# Patient Record
Sex: Female | Born: 1945 | Race: Black or African American | Hispanic: No | Smoking: Never smoker
Health system: Southern US, Community
[De-identification: ages and names within clinical notes are randomized; demographics above are authoritative.]

## PROBLEM LIST (undated history)

## (undated) DIAGNOSIS — I82409 Acute embolism and thrombosis of unspecified deep veins of unspecified lower extremity: Secondary | ICD-10-CM

## (undated) DIAGNOSIS — K759 Inflammatory liver disease, unspecified: Secondary | ICD-10-CM

## (undated) DIAGNOSIS — E119 Type 2 diabetes mellitus without complications: Secondary | ICD-10-CM

## (undated) DIAGNOSIS — J189 Pneumonia, unspecified organism: Secondary | ICD-10-CM

## (undated) DIAGNOSIS — M1990 Unspecified osteoarthritis, unspecified site: Secondary | ICD-10-CM

## (undated) DIAGNOSIS — N189 Chronic kidney disease, unspecified: Secondary | ICD-10-CM

## (undated) DIAGNOSIS — R7303 Prediabetes: Secondary | ICD-10-CM

## (undated) DIAGNOSIS — I10 Essential (primary) hypertension: Secondary | ICD-10-CM

## (undated) HISTORY — PX: EYE SURGERY: SHX253

---

## 2003-09-22 ENCOUNTER — Ambulatory Visit: Payer: Self-pay | Admitting: Family Medicine

## 2003-09-23 ENCOUNTER — Ambulatory Visit: Payer: Self-pay | Admitting: *Deleted

## 2003-09-27 ENCOUNTER — Ambulatory Visit (HOSPITAL_COMMUNITY): Admission: RE | Admit: 2003-09-27 | Discharge: 2003-09-27 | Payer: Self-pay | Admitting: Internal Medicine

## 2003-09-28 ENCOUNTER — Ambulatory Visit (HOSPITAL_COMMUNITY): Admission: RE | Admit: 2003-09-28 | Discharge: 2003-09-28 | Payer: Self-pay | Admitting: Internal Medicine

## 2004-03-03 ENCOUNTER — Ambulatory Visit: Payer: Self-pay | Admitting: Family Medicine

## 2004-04-24 ENCOUNTER — Ambulatory Visit: Payer: Self-pay | Admitting: Family Medicine

## 2004-04-24 ENCOUNTER — Ambulatory Visit: Admission: RE | Admit: 2004-04-24 | Discharge: 2004-04-24 | Payer: Self-pay | Admitting: Internal Medicine

## 2004-06-23 ENCOUNTER — Ambulatory Visit: Payer: Self-pay | Admitting: Family Medicine

## 2004-06-29 ENCOUNTER — Ambulatory Visit: Payer: Self-pay | Admitting: Family Medicine

## 2004-07-28 ENCOUNTER — Ambulatory Visit (HOSPITAL_COMMUNITY): Admission: RE | Admit: 2004-07-28 | Discharge: 2004-07-28 | Payer: Self-pay | Admitting: Internal Medicine

## 2004-09-12 ENCOUNTER — Ambulatory Visit: Payer: Self-pay | Admitting: Family Medicine

## 2005-02-08 ENCOUNTER — Ambulatory Visit: Payer: Self-pay | Admitting: Family Medicine

## 2005-03-12 ENCOUNTER — Ambulatory Visit: Payer: Self-pay | Admitting: Family Medicine

## 2005-05-18 ENCOUNTER — Ambulatory Visit: Payer: Self-pay | Admitting: Family Medicine

## 2005-05-18 LAB — CONVERTED CEMR LAB: Pap Smear: NORMAL

## 2005-07-27 ENCOUNTER — Ambulatory Visit: Payer: Self-pay | Admitting: Family Medicine

## 2005-12-21 ENCOUNTER — Ambulatory Visit (HOSPITAL_COMMUNITY): Admission: RE | Admit: 2005-12-21 | Discharge: 2005-12-21 | Payer: Self-pay | Admitting: Family Medicine

## 2006-01-11 ENCOUNTER — Ambulatory Visit: Payer: Self-pay | Admitting: Family Medicine

## 2006-02-18 ENCOUNTER — Ambulatory Visit: Payer: Self-pay | Admitting: Family Medicine

## 2006-05-01 ENCOUNTER — Ambulatory Visit: Payer: Self-pay | Admitting: Family Medicine

## 2006-07-02 ENCOUNTER — Ambulatory Visit: Payer: Self-pay | Admitting: Internal Medicine

## 2006-07-09 ENCOUNTER — Encounter (INDEPENDENT_AMBULATORY_CARE_PROVIDER_SITE_OTHER): Payer: Self-pay | Admitting: Family Medicine

## 2006-07-09 DIAGNOSIS — E785 Hyperlipidemia, unspecified: Secondary | ICD-10-CM | POA: Insufficient documentation

## 2006-07-09 DIAGNOSIS — D509 Iron deficiency anemia, unspecified: Secondary | ICD-10-CM | POA: Insufficient documentation

## 2006-07-09 DIAGNOSIS — F329 Major depressive disorder, single episode, unspecified: Secondary | ICD-10-CM | POA: Insufficient documentation

## 2006-07-09 DIAGNOSIS — I10 Essential (primary) hypertension: Secondary | ICD-10-CM | POA: Insufficient documentation

## 2006-09-25 ENCOUNTER — Encounter (INDEPENDENT_AMBULATORY_CARE_PROVIDER_SITE_OTHER): Payer: Self-pay | Admitting: *Deleted

## 2007-02-05 ENCOUNTER — Ambulatory Visit (HOSPITAL_COMMUNITY): Admission: RE | Admit: 2007-02-05 | Discharge: 2007-02-05 | Payer: Self-pay | Admitting: Family Medicine

## 2007-03-12 ENCOUNTER — Ambulatory Visit: Payer: Self-pay | Admitting: Internal Medicine

## 2007-03-12 ENCOUNTER — Encounter (INDEPENDENT_AMBULATORY_CARE_PROVIDER_SITE_OTHER): Payer: Self-pay | Admitting: Family Medicine

## 2007-03-12 LAB — CONVERTED CEMR LAB
AST: 23 units/L (ref 0–37)
Albumin: 2.8 g/dL — ABNORMAL LOW (ref 3.5–5.2)
Basophils Absolute: 0 10*3/uL (ref 0.0–0.1)
CO2: 29 meq/L (ref 19–32)
Chloride: 103 meq/L (ref 96–112)
Cholesterol: 274 mg/dL — ABNORMAL HIGH (ref 0–200)
Creatinine, Ser: 0.8 mg/dL (ref 0.40–1.20)
Glucose, Bld: 82 mg/dL (ref 70–99)
HCT: 36.3 % (ref 36.0–46.0)
HDL: 94 mg/dL (ref >39)
Hemoglobin: 11 g/dL — ABNORMAL LOW (ref 12.0–15.0)
Lymphs Abs: 1.8 10*3/uL (ref 0.7–4.0)
MCHC: 30.3 g/dL (ref 30.0–36.0)
MCV: 69.9 fL — ABNORMAL LOW (ref 78.0–100.0)
Monocytes Relative: 10 % (ref 3–12)
Potassium: 3.8 meq/L (ref 3.5–5.3)
RBC: 5.19 M/uL — ABNORMAL HIGH (ref 3.87–5.11)
RDW: 16 % — ABNORMAL HIGH (ref 11.5–15.5)
Sodium: 141 meq/L (ref 135–145)
Total Bilirubin: 0.3 mg/dL (ref 0.3–1.2)

## 2007-03-15 ENCOUNTER — Encounter (INDEPENDENT_AMBULATORY_CARE_PROVIDER_SITE_OTHER): Payer: Self-pay | Admitting: Family Medicine

## 2007-03-15 LAB — CONVERTED CEMR LAB
Collection Interval-CRCL: 24 hr
Creatinine 24 HR UR: 804 mg/24hr (ref 700–1800)
Creatinine Clearance: 70 mL/min — ABNORMAL LOW (ref 75–115)

## 2007-03-21 ENCOUNTER — Ambulatory Visit: Payer: Self-pay | Admitting: Internal Medicine

## 2010-01-29 ENCOUNTER — Encounter: Payer: Self-pay | Admitting: Internal Medicine

## 2010-02-08 NOTE — Miscellaneous (Signed)
Summary: VIP  Patient: Tina Bryant Note: All result statuses are Final unless otherwise noted.  Tests: (1) VIP (Medications)   LLIMPORTMEDS              "Result Below..."       RESULT: VISTARIL CAPS 25 MG*TAKE ONE CAPSULE BY MOUTH AT BEDTIME AS NEEDED FOR  SLEEP*01/11/2006*Last Refill: [id number]*******   LLIMPORTMEDS              "Result Below..."       RESULT: TRAMADOL HCL TABS 50 MG*TAKE ONE TABLET EVERY 8 HOURS AS NEEDED FOR  BACK PAIN  Generic for ULTRAM 50MG  TAB*07/02/2006*Last Refill: [id number]*******   LLIMPORTMEDS              "Result Below..."       RESULT: TOPROL XL TB24 50 MG*TAKE ONE (1) TABLET EACH DAY*08/15/2006*Last Refill: 10/02/2006*[id number]*******   LLIMPORTMEDS              "Result Below..."       RESULT: METHOCARBAMOL TABS 500 MG*TAKE ONE TABLET AT BEDTIME AS NEEDED FOR  MUSCLE RELAXATION  Generic for ROBAXIN 500MG  TAB*07/02/2006*Last Refill: [id number]*******   LLIMPORTMEDS              "Result Below..."       RESULT: LIPITOR TABS 10 MG*TAKE ONE (1) TABLET EACH DAY*08/15/2006*Last Refill: 10/02/2006*[id number]*******   LLIMPORTMEDS              "Result Below..."       RESULT: HYDROCHLOROTHIAZIDE TABS 25 MG*TAKE ONE (1) TABLET EVERY MORNING*08/15/2006*Last Refill: 10/02/2006*[id number]*******   LLIMPORTMEDS              "Result Below..."       RESULT: COMPOUND CREAM*APPLY TO SKIN TWICE DAILY AS NEEDED  TRIAMCINOLONE  CREAM 0.1%/PETROLATUM 1:2*01/11/2006*Last Refill: Unknown********   LLIMPORTALLS              NKDA***  Note: An exclamation mark (!) indicates a result that was not dispersed into the flowsheet. Document Creation Date: 11/07/2006 3:00 PM _______________________________________________________________________  (1) Order result status: Final Collection or observation date-time: 09/25/2006 Requested date-time: 09/25/2006 Receipt date-time:  Reported date-time: 09/25/2006 Referring Physician:   Ordering Physician:   Specimen Source:  Source:  [place] Order Number:  Lab site:

## 2014-08-22 ENCOUNTER — Emergency Department (HOSPITAL_BASED_OUTPATIENT_CLINIC_OR_DEPARTMENT_OTHER): Payer: Self-pay

## 2014-08-22 ENCOUNTER — Emergency Department (HOSPITAL_BASED_OUTPATIENT_CLINIC_OR_DEPARTMENT_OTHER)
Admission: EM | Admit: 2014-08-22 | Discharge: 2014-08-22 | Disposition: A | Discharge status: Home / Self Care | Payer: Self-pay | Attending: Emergency Medicine | Admitting: Emergency Medicine

## 2014-08-22 ENCOUNTER — Encounter (HOSPITAL_BASED_OUTPATIENT_CLINIC_OR_DEPARTMENT_OTHER): Payer: Self-pay | Admitting: *Deleted

## 2014-08-22 DIAGNOSIS — M19012 Primary osteoarthritis, left shoulder: Secondary | ICD-10-CM | POA: Insufficient documentation

## 2014-08-22 DIAGNOSIS — I10 Essential (primary) hypertension: Secondary | ICD-10-CM | POA: Insufficient documentation

## 2014-08-22 HISTORY — DX: Type 2 diabetes mellitus without complications: E11.9

## 2014-08-22 HISTORY — DX: Essential (primary) hypertension: I10

## 2014-08-22 NOTE — ED Provider Notes (Signed)
CSN: [id number]     Arrival date & time 08/22/14  1424 History  This chart was scribed for Leonard Schwartz, MD by Rayna Sexton, ED scribe. This patient was seen in room MH06/MH06 and the patient's care was started at 3:07 PM.  Chief Complaint  Patient presents with  . Shoulder Pain   The history is provided by the patient. No language interpreter was used.    HPI Comments: Tina Bryant is a 69 y.o. female, with a hx of HTN and DM, who presents to the Emergency Department complaining of constant, moderate, worsening left shoulder pain with onset 5 days ago. Pt denies any hx of left shoulder issues, any recent heavy lifting and notes a worsening of her pain with movement. She describes her pain as a pressure and further notes that it radiates down her left arm. She notes taking tylenol for pain management which provided little relief. Pt denies any other associated symptoms.  Past Medical History  Diagnosis Date  . Hypertension   . Diabetes mellitus without complication     borderline   History reviewed. No pertinent past surgical history. No family history on file. Social History  Substance Use Topics  . Smoking status: Never Smoker   . Smokeless tobacco: None  . Alcohol Use: No   OB History    No data available     Review of Systems  Musculoskeletal: Positive for myalgias and arthralgias.  Skin: Negative for wound.  All other systems reviewed and are negative.   Allergies  Tramadol  Home Medications   Prior to Admission medications   Medication Sig Start Date End Date Taking? Authorizing Provider  ATORVASTATIN CALCIUM PO Take by mouth.   Yes Historical Provider, MD  LISINOPRIL PO Take by mouth.   Yes Historical Provider, MD  SPIRONOLACTONE PO Take by mouth.   Yes Historical Provider, MD   BP 113/67 mmHg  Pulse 68  Temp(Src) 98.2 F (36.8 C)  Resp 18  Ht 5\' 7"  (1.702 m)  Wt 174 lb 8 oz (79.153 kg)  BMI 27.32 kg/m2  SpO2 100% Physical Exam  Constitutional:  She is oriented to person, place, and time. She appears well-developed and well-nourished. No distress.  HENT:  Head: Normocephalic and atraumatic.  Eyes: Pupils are equal, round, and reactive to light.  Neck: Normal range of motion.  Cardiovascular: Normal rate and intact distal pulses.   Pulmonary/Chest: No respiratory distress.  Abdominal: Normal appearance. She exhibits no distension.  Musculoskeletal: Normal range of motion.       Left shoulder: She exhibits tenderness and pain. She exhibits no swelling, no effusion, no deformity and normal pulse.       Arms: Neurological: She is alert and oriented to person, place, and time. No cranial nerve deficit. GCS eye subscore is 4. GCS verbal subscore is 5. GCS motor subscore is 6.  Skin: Skin is warm and dry. No rash noted.  Psychiatric: She has a normal mood and affect. Her behavior is normal.  Nursing note and vitals reviewed.   ED Course  Procedures  DIAGNOSTIC STUDIES: Oxygen Saturation is 100% on RA, normal by my interpretation.    COORDINATION OF CARE: 3:09 PM Discussed treatment plan with pt at bedside and pt agreed to plan.  Labs Review Labs Reviewed - No data to display  Imaging Review Dg Chest 2 View  08/22/2014   CLINICAL DATA:  Left shoulder pain for 5 days  EXAM: CHEST  2 VIEW  COMPARISON:  None.  FINDINGS:  The heart size and mediastinal contours are within normal limits. Both lungs are clear. Mild degenerative changes mid and lower thoracic spine.  IMPRESSION: No active cardiopulmonary disease. Degenerative changes thoracic spine.   Electronically Signed   By: Lahoma Crocker M.D.   On: 08/22/2014 15:34   Dg Shoulder Left  08/22/2014   CLINICAL DATA:  69 year old female with left shoulder pain for 5 days, no known injury. Pain radiating distally. Initial encounter.  EXAM: LEFT SHOULDER - 2+ VIEW  COMPARISON:  None.  FINDINGS: No glenohumeral joint dislocation. Proximal left humerus appears intact. Left clavicle and scapula  appear intact. Mild glenoid degenerative spurring. Visualized left ribs and lung parenchyma within normal limits.  IMPRESSION: Degenerative changes. No acute osseous abnormality identified about the left shoulder.   Electronically Signed   By: Genevie Ann M.D.   On: 08/22/2014 15:34   I, Leonard Schwartz, MD, personally reviewed and evaluated these images and lab results as part of my medical decision-making.   EKG Interpretation   Date/Time:  Sunday August 22 2014 15:35:44 EDT Ventricular Rate:  65 PR Interval:  142 QRS Duration: 86 QT Interval:  406 QTC Calculation: 422 R Axis:   22 Text Interpretation:  Normal sinus rhythm Normal ECG Confirmed by Bernardo Brayman   MD, Eyana Stolze ([id number]) on 08/22/2014 3:47:14 PM      MDM   Final diagnoses:  Primary osteoarthritis of left shoulder    I personally performed the services described in this documentation, which was scribed in my presence. The recorded information has been reviewed and considered.    Leonard Schwartz, MD 08/22/14 [phone]

## 2014-08-22 NOTE — Discharge Instructions (Signed)
Take 400 mg of ibuprofen 3-4 times a day.  Arthritis, Nonspecific Arthritis is inflammation of a joint. This usually means pain, redness, warmth or swelling are present. One or more joints may be involved. There are a number of types of arthritis. Your caregiver may not be able to tell what type of arthritis you have right away. CAUSES  The most common cause of arthritis is the wear and tear on the joint (osteoarthritis). This causes damage to the cartilage, which can break down over time. The knees, hips, back and neck are most often affected by this type of arthritis. Other types of arthritis and common causes of joint pain include:  Sprains and other injuries near the joint. Sometimes minor sprains and injuries cause pain and swelling that develop hours later.  Rheumatoid arthritis. This affects hands, feet and knees. It usually affects both sides of your body at the same time. It is often associated with chronic ailments, fever, weight loss and general weakness.  Crystal arthritis. Gout and pseudo gout can cause occasional acute severe pain, redness and swelling in the foot, ankle, or knee.  Infectious arthritis. Bacteria can get into a joint through a break in overlying skin. This can cause infection of the joint. Bacteria and viruses can also spread through the blood and affect your joints.  Drug, infectious and allergy reactions. Sometimes joints can become mildly painful and slightly swollen with these types of illnesses. SYMPTOMS   Pain is the main symptom.  Your joint or joints can also be red, swollen and warm or hot to the touch.  You may have a fever with certain types of arthritis, or even feel overall ill.  The joint with arthritis will hurt with movement. Stiffness is present with some types of arthritis. DIAGNOSIS  Your caregiver will suspect arthritis based on your description of your symptoms and on your exam. Testing may be needed to find the type of arthritis:  Blood  and sometimes urine tests.  X-ray tests and sometimes CT or MRI scans.  Removal of fluid from the joint (arthrocentesis) is done to check for bacteria, crystals or other causes. Your caregiver (or a specialist) will numb the area over the joint with a local anesthetic, and use a needle to remove joint fluid for examination. This procedure is only minimally uncomfortable.  Even with these tests, your caregiver may not be able to tell what kind of arthritis you have. Consultation with a specialist (rheumatologist) may be helpful. TREATMENT  Your caregiver will discuss with you treatment specific to your type of arthritis. If the specific type cannot be determined, then the following general recommendations may apply. Treatment of severe joint pain includes:  Rest.  Elevation.  Anti-inflammatory medication (for example, ibuprofen) may be prescribed. Avoiding activities that cause increased pain.  Only take over-the-counter or prescription medicines for pain and discomfort as recommended by your caregiver.  Cold packs over an inflamed joint may be used for 10 to 15 minutes every hour. Hot packs sometimes feel better, but do not use overnight. Do not use hot packs if you are diabetic without your caregiver's permission.  A cortisone shot into arthritic joints may help reduce pain and swelling.  Any acute arthritis that gets worse over the next 1 to 2 days needs to be looked at to be sure there is no joint infection. Long-term arthritis treatment involves modifying activities and lifestyle to reduce joint stress jarring. This can include weight loss. Also, exercise is needed to nourish the  joint cartilage and remove waste. This helps keep the muscles around the joint strong. HOME CARE INSTRUCTIONS   Do not take aspirin to relieve pain if gout is suspected. This elevates uric acid levels.  Only take over-the-counter or prescription medicines for pain, discomfort or fever as directed by your  caregiver.  Rest the joint as much as possible.  If your joint is swollen, keep it elevated.  Use crutches if the painful joint is in your leg.  Drinking plenty of fluids may help for certain types of arthritis.  Follow your caregiver's dietary instructions.  Try low-impact exercise such as:  Swimming.  Water aerobics.  Biking.  Walking.  Morning stiffness is often relieved by a warm shower.  Put your joints through regular range-of-motion. SEEK MEDICAL CARE IF:   You do not feel better in 24 hours or are getting worse.  You have side effects to medications, or are not getting better with treatment. SEEK IMMEDIATE MEDICAL CARE IF:   You have a fever.  You develop severe joint pain, swelling or redness.  Many joints are involved and become painful and swollen.  There is severe back pain and/or leg weakness.  You have loss of bowel or bladder control. Document Released: 02/02/2004 Document Revised: 03/19/2011 Document Reviewed: 02/18/2008 [facility] Patient Information 2015 [place], [place]. This information is not intended to replace advice given to you by your health care provider. Make sure you discuss any questions you have with your health care provider.

## 2014-08-22 NOTE — ED Notes (Addendum)
patient c/o L shoulder pain that has grown worse over the past 5 days. Started in shoulder and now radiates down her arm, took tylenol but no relief

## 2017-08-26 ENCOUNTER — Other Ambulatory Visit (HOSPITAL_COMMUNITY): Payer: Self-pay | Admitting: Family Medicine

## 2017-08-28 ENCOUNTER — Other Ambulatory Visit (HOSPITAL_COMMUNITY): Payer: Self-pay | Admitting: Family Medicine

## 2017-08-28 ENCOUNTER — Other Ambulatory Visit: Payer: Self-pay | Admitting: Family Medicine

## 2017-08-28 DIAGNOSIS — E118 Type 2 diabetes mellitus with unspecified complications: Secondary | ICD-10-CM

## 2017-09-11 ENCOUNTER — Encounter (HOSPITAL_COMMUNITY): Payer: Self-pay | Admitting: Radiology

## 2017-09-11 ENCOUNTER — Ambulatory Visit (HOSPITAL_COMMUNITY)
Admission: RE | Admit: 2017-09-11 | Discharge: 2017-09-11 | Disposition: A | Discharge status: Home / Self Care | Payer: Medicaid Other | Source: Ambulatory Visit | Attending: Family Medicine | Admitting: Family Medicine

## 2017-09-11 DIAGNOSIS — E118 Type 2 diabetes mellitus with unspecified complications: Secondary | ICD-10-CM | POA: Diagnosis not present

## 2017-09-11 DIAGNOSIS — E1165 Type 2 diabetes mellitus with hyperglycemia: Secondary | ICD-10-CM | POA: Insufficient documentation

## 2017-09-11 LAB — NM MYOCAR MULTI W/SPECT W/WALL MOTION / EF
CSEPEDS: 12 s
CSEPEW: 1 METS
CSEPHR: 67 %
Exercise duration (min): 8 min
MPHR: 147 {beats}/min
Peak HR: 99 {beats}/min
Rest HR: 85 {beats}/min

## 2017-09-11 MED ORDER — REGADENOSON 0.4 MG/5ML IV SOLN
0.4000 mg | Freq: Once | INTRAVENOUS | Status: AC
  Administered 2017-09-11: 0.4 mg via INTRAVENOUS

## 2017-09-11 MED ORDER — REGADENOSON 0.4 MG/5ML IV SOLN
INTRAVENOUS | Status: AC
  Administered 2017-09-11: 0.4 mg via INTRAVENOUS
  Filled 2017-09-11: qty 5

## 2017-09-11 MED ORDER — TECHNETIUM TC 99M TETROFOSMIN IV KIT
10.0000 | PACK | Freq: Once | INTRAVENOUS | Status: AC | PRN
  Administered 2017-09-11: 10 via INTRAVENOUS

## 2017-09-11 NOTE — Progress Notes (Signed)
   Tina Bryant presented for a nuclear stress test today.  No immediate complications.  Stress imaging is pending at this time.  Preliminary EKG findings may be listed in the chart, but the stress test result will not be finalized until perfusion imaging is complete.  1 day study, [facility] to read.  Rosaria Ferries, PA-C 09/11/2017, 12:30 PM

## 2018-08-26 ENCOUNTER — Other Ambulatory Visit: Payer: Self-pay | Admitting: Family Medicine

## 2018-08-26 DIAGNOSIS — Z1231 Encounter for screening mammogram for malignant neoplasm of breast: Secondary | ICD-10-CM

## 2019-03-10 ENCOUNTER — Other Ambulatory Visit: Payer: Self-pay | Admitting: Family Medicine

## 2019-03-10 DIAGNOSIS — Z1231 Encounter for screening mammogram for malignant neoplasm of breast: Secondary | ICD-10-CM

## 2019-04-30 ENCOUNTER — Ambulatory Visit: Payer: Medicaid Other

## 2019-05-12 ENCOUNTER — Other Ambulatory Visit: Payer: Self-pay | Admitting: Endocrinology

## 2019-05-12 DIAGNOSIS — N6452 Nipple discharge: Secondary | ICD-10-CM

## 2019-05-29 ENCOUNTER — Other Ambulatory Visit: Payer: Medicaid Other

## 2019-06-10 ENCOUNTER — Other Ambulatory Visit: Payer: Self-pay | Admitting: Endocrinology

## 2019-06-10 ENCOUNTER — Other Ambulatory Visit: Payer: Self-pay

## 2019-06-10 ENCOUNTER — Ambulatory Visit
Admission: RE | Admit: 2019-06-10 | Discharge: 2019-06-10 | Disposition: A | Discharge status: Home / Self Care | Payer: Medicaid Other | Source: Ambulatory Visit | Attending: Endocrinology | Admitting: Endocrinology

## 2019-06-10 DIAGNOSIS — N6452 Nipple discharge: Secondary | ICD-10-CM

## 2019-06-18 ENCOUNTER — Other Ambulatory Visit: Payer: Medicaid Other

## 2019-06-24 ENCOUNTER — Ambulatory Visit
Admission: RE | Admit: 2019-06-24 | Discharge: 2019-06-24 | Disposition: A | Discharge status: Home / Self Care | Payer: Medicaid Other | Source: Ambulatory Visit | Attending: Endocrinology | Admitting: Endocrinology

## 2019-06-24 ENCOUNTER — Other Ambulatory Visit: Payer: Self-pay

## 2019-06-24 DIAGNOSIS — N6452 Nipple discharge: Secondary | ICD-10-CM

## 2020-02-02 ENCOUNTER — Encounter: Payer: Self-pay | Admitting: Neurology

## 2020-04-06 ENCOUNTER — Ambulatory Visit: Payer: Medicaid Other | Admitting: Neurology

## 2020-04-14 ENCOUNTER — Other Ambulatory Visit: Payer: Self-pay | Admitting: Surgery

## 2020-04-14 DIAGNOSIS — D249 Benign neoplasm of unspecified breast: Secondary | ICD-10-CM

## 2020-05-23 ENCOUNTER — Ambulatory Visit: Payer: Medicaid Other | Admitting: Neurology

## 2020-11-24 IMAGING — MG MM BREAST LOCALIZATION CLIP
4 series · 4 of 12 positions shown · non-contrast
Comparison: Previous exam(s).

CLINICAL DATA: Patient status post ultrasound-guided core needle
biopsy retroareolar right breast mass.

EXAM:
DIAGNOSTIC RIGHT MAMMOGRAM POST ULTRASOUND BIOPSY

[R ML synth-2D]
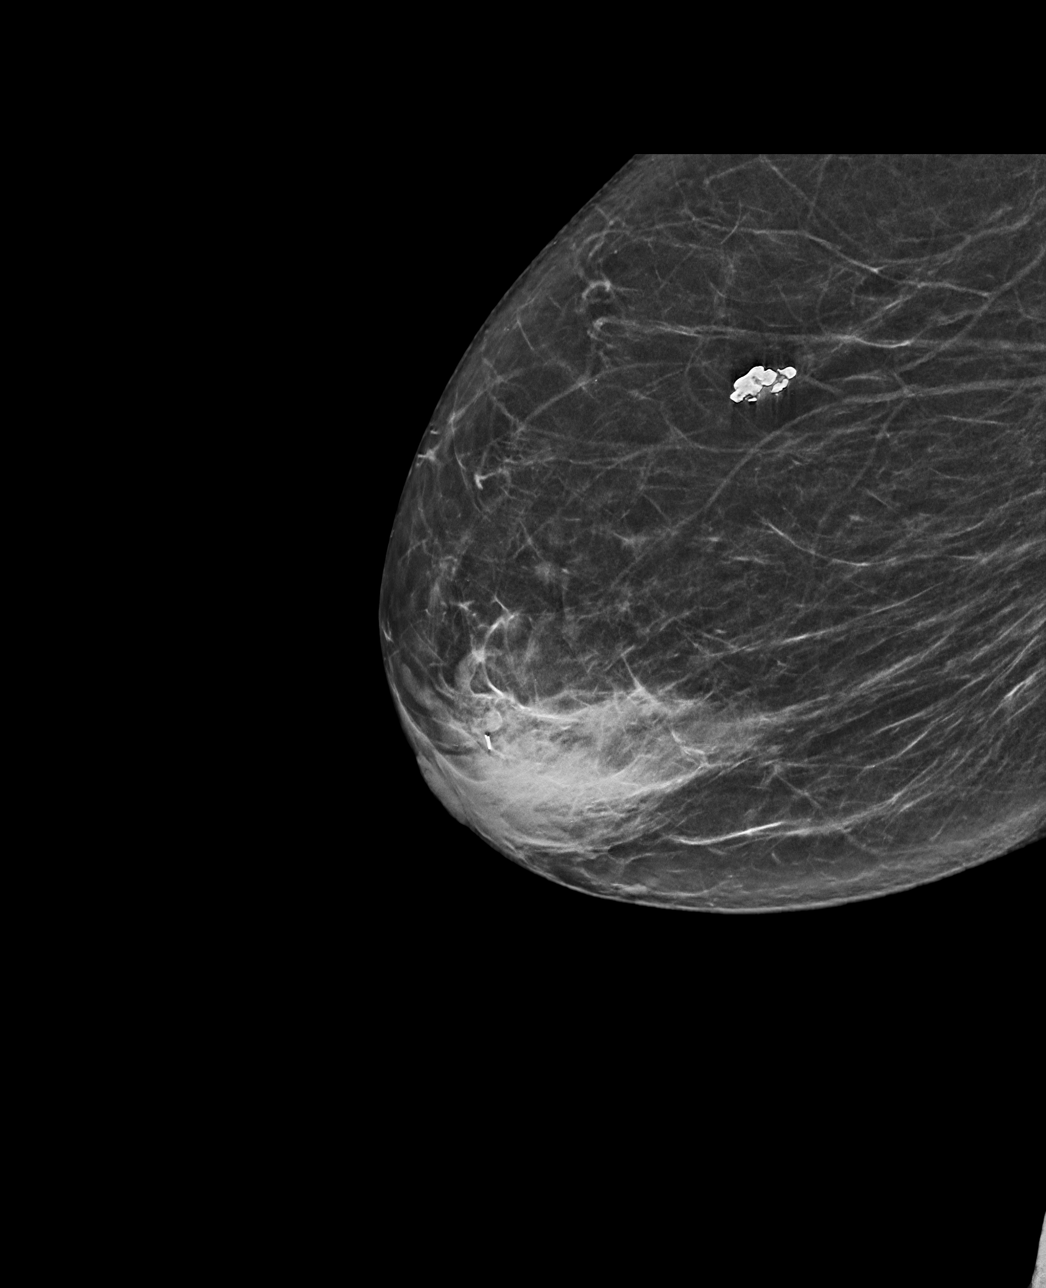

[R CC synth-2D]
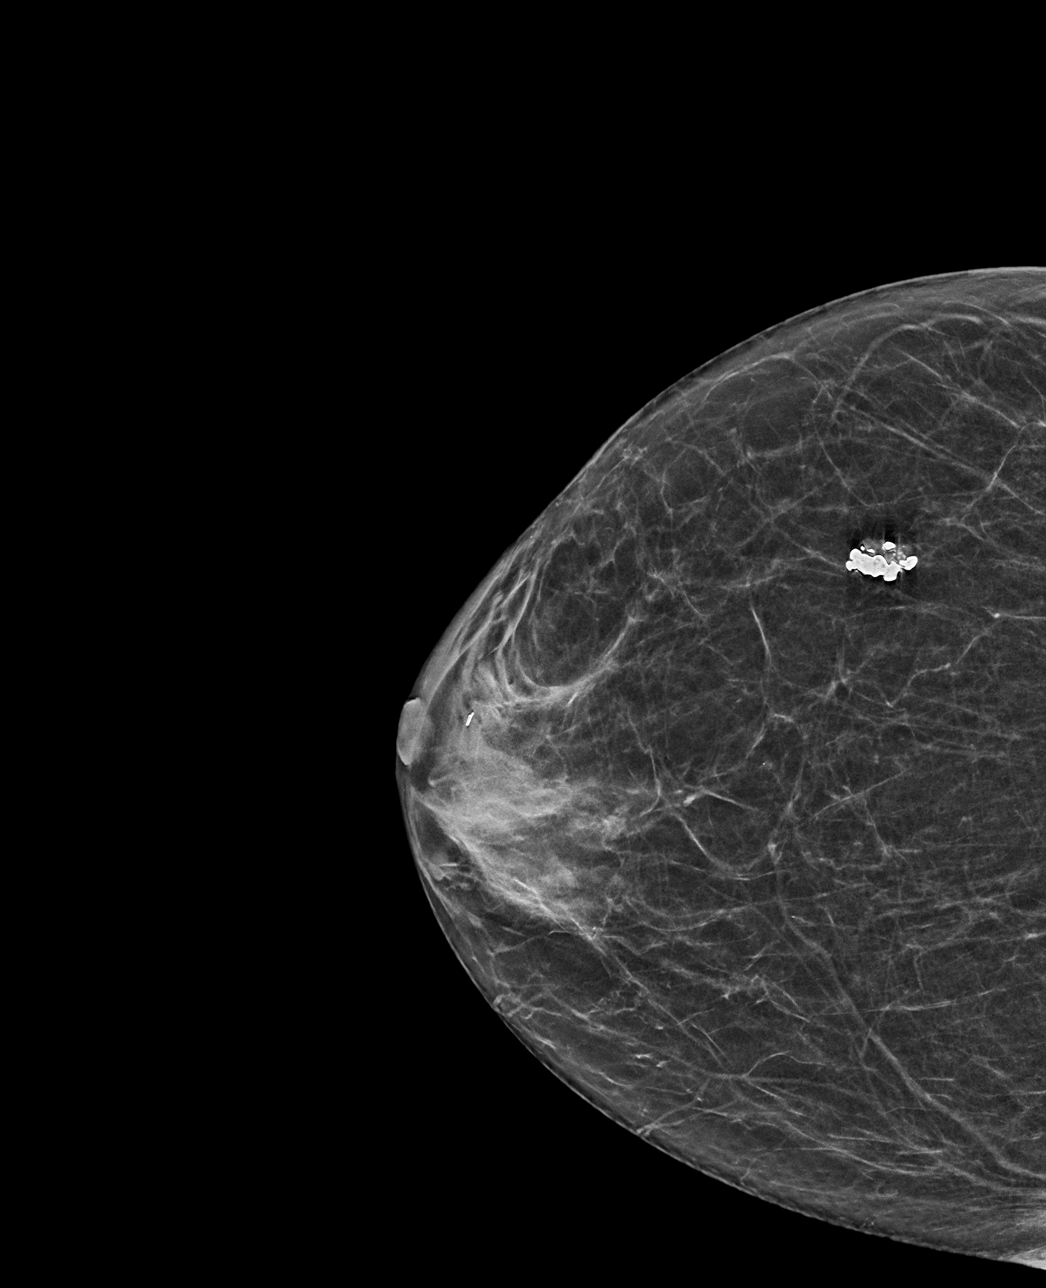

[R CC tomo · tomo slice 27/54.0]
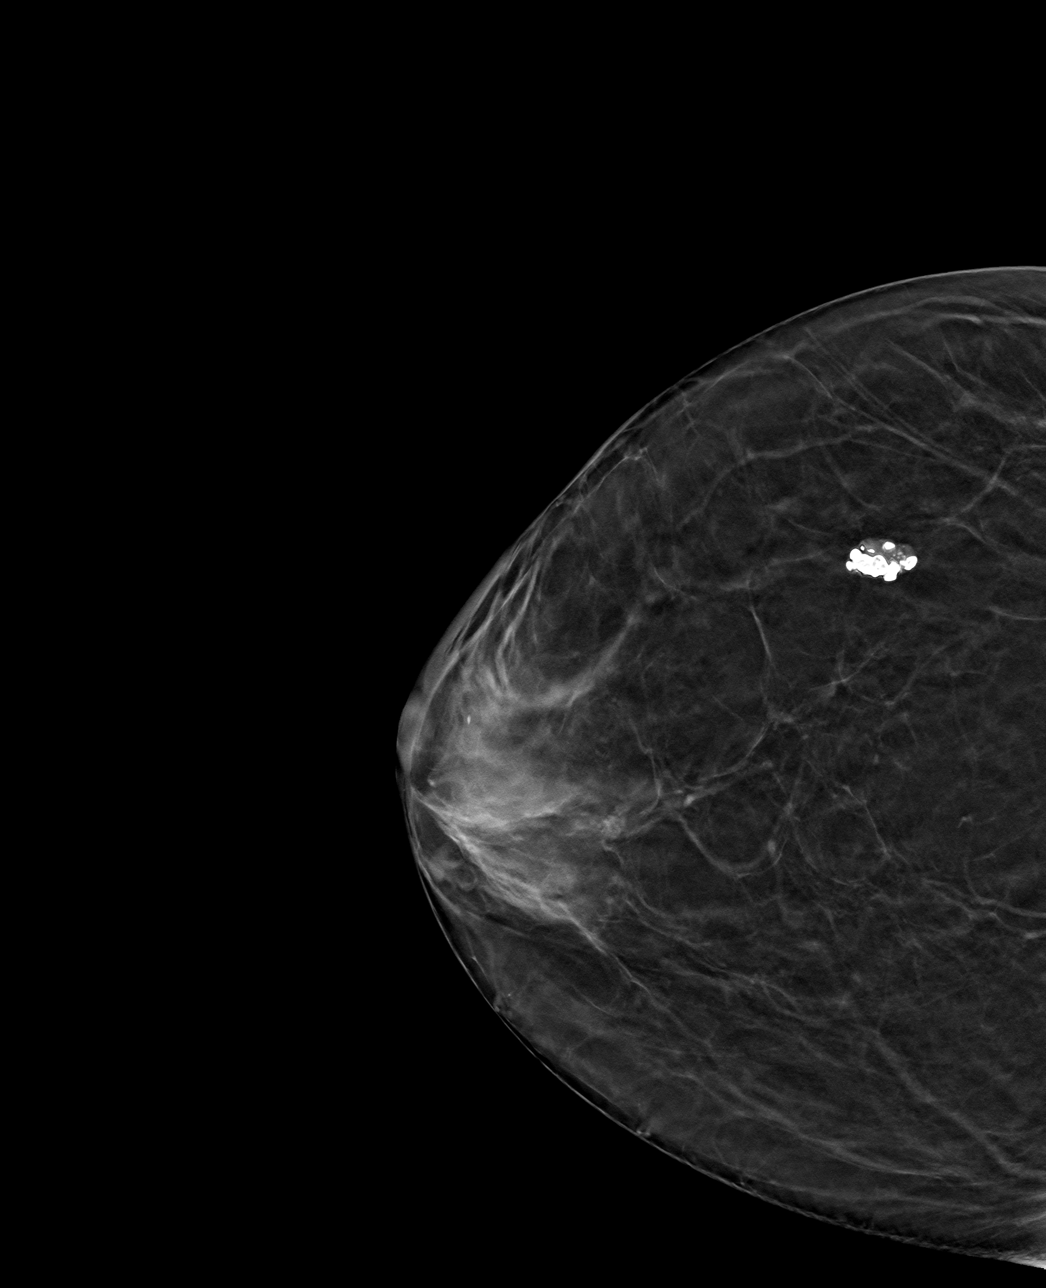

[R ML tomo · tomo slice 32/63.0]
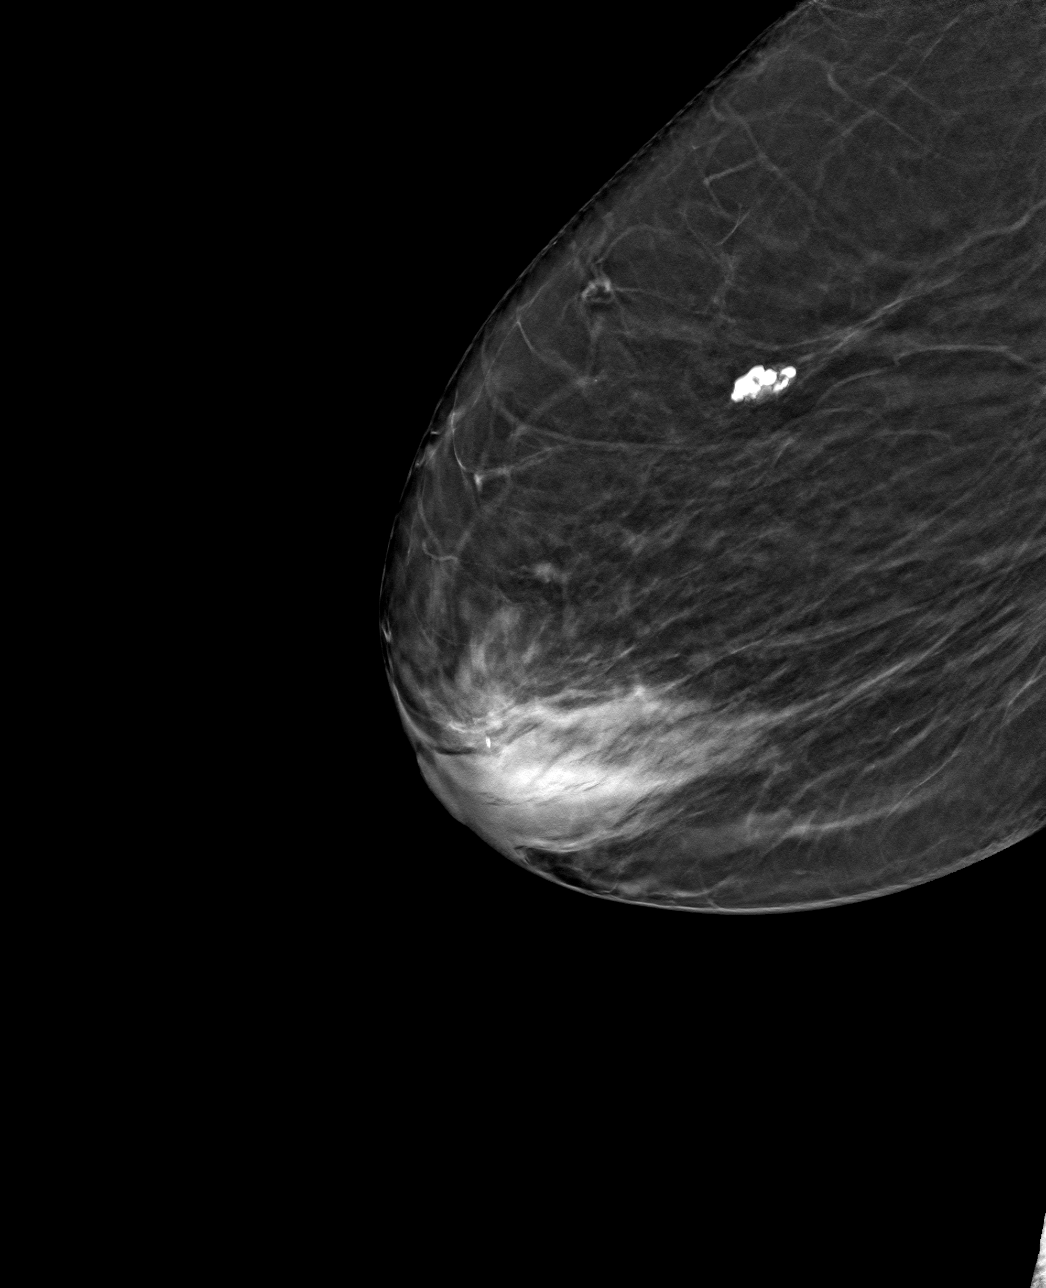

[4 of 12 positions shown; findings below may reference images not displayed]

FINDINGS: Mammographic images were obtained following ultrasound guided biopsy
of right breast mass 9 o'clock position. The biopsy marking clip is
in expected position at the site of biopsy.
IMPRESSION: Appropriate positioning of the ribbon shaped biopsy marking clip at
the site of biopsy in the right breast mass 9 o'clock position.

Final Assessment: Post Procedure Mammograms for Marker Placement

## 2021-09-15 NOTE — Therapy (Signed)
OUTPATIENT PHYSICAL THERAPY EVALUATION   Patient Name: Tina Bryant MRN: [id number] DOB:13-Mar-1945, 76 y.o., female Today's Date: 09/15/2021    Past Medical History:  Diagnosis Date   Diabetes mellitus without complication ([place])    borderline   Hypertension    No past surgical history on file. 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  Rationale for Evaluation and Treatment Rehabilitation  ONSET DATE: ***   SUBJECTIVE:            SUBJECTIVE STATEMENT: ***  PERTINENT HISTORY: ***  PAIN:  Are you having pain? Yes:  NPRS scale: ***/10 Pain location: Right shoulder Pain description: *** Aggravating factors: *** Relieving factors: ***  PRECAUTIONS: None  WEIGHT BEARING RESTRICTIONS No  FALLS:  Has patient fallen in last 6 months? {fallsyesno:27318}  LIVING ENVIRONMENT: Lives with: {[facility] lives with:25569::"lives with their family"} Lives in: {Lives in:25570} Stairs: {opstairs:27293} Has following equipment at home: {Assistive devices:23999}  OCCUPATION: ***  PLOF: Independent  PATIENT GOALS ***   OBJECTIVE:  PATIENT SURVEYS:  Quick Dash ***  COGNITION:  Overall cognitive status: Within functional limits for tasks assessed     SENSATION: WFL  POSTURE: ***  UPPER EXTREMITY ROM:   Active ROM Right eval Left eval  Shoulder flexion    Shoulder extension    Shoulder abduction    Shoulder adduction    Shoulder internal rotation    Shoulder external rotation    Elbow flexion    Elbow extension    Wrist flexion    Wrist extension    Wrist ulnar deviation    Wrist radial deviation    Wrist pronation    Wrist supination    (Blank rows = not tested)  UPPER  EXTREMITY MMT:  MMT Right eval Left eval  Shoulder flexion    Shoulder extension    Shoulder abduction    Shoulder adduction    Shoulder internal rotation    Shoulder external rotation    Middle trapezius    Lower trapezius    Elbow flexion    Elbow extension    Wrist flexion    Wrist extension    Wrist ulnar deviation    Wrist radial deviation    Wrist pronation    Wrist supination    Grip strength (lbs)    (Blank rows = not tested)  SHOULDER SPECIAL TESTS:  Impingement tests: {shoulder impingement test:25231:a}  SLAP lesions: {SLAP lesions:25232}  Instability tests: {shoulder instability test:25233}  Rotator cuff assessment: {rotator cuff assessment:25234}  Biceps assessment: {biceps assessment:25235}  JOINT MOBILITY TESTING:  ***  PALPATION:  ***    TODAY'S TREATMENT:  ***   PATIENT EDUCATION: Education details: Exam findings, POC, HEP Person educated: Patient Education method: Explanation, Demonstration, Tactile cues, Verbal cues, and Handouts Education comprehension: verbalized understanding, returned demonstration, verbal cues required, tactile cues required, and needs further education  HOME EXERCISE PROGRAM: ***   ASSESSMENT: CLINICAL IMPRESSION: Patient is a 76 y.o. female who was seen today for physical therapy evaluation and treatment for chronic right shoulder pain. ***    OBJECTIVE IMPAIRMENTS {opptimpairments:25111}.   ACTIVITY LIMITATIONS {activitylimitations:27494}  PARTICIPATION LIMITATIONS: {participationrestrictions:25113}  PERSONAL FACTORS {Personal factors:25162} are also affecting patient's functional outcome.   REHAB POTENTIAL: {rehabpotential:25112}  CLINICAL DECISION MAKING: {  clinical decision making:25114}  EVALUATION COMPLEXITY: {Evaluation complexity:25115}   GOALS: Goals reviewed with patient? Yes  SHORT TERM GOALS: Target date: {follow up:25551}   Patient will be I with initial HEP in order to progress with  therapy. Baseline: HEP provided at eval Goal status: INITIAL  2.  *** Baseline:  Goal status: INITIAL  3.  *** Baseline:  Goal status: INITIAL  LONG TERM GOALS: Target date: {follow up:25551}   Patient will be I with final HEP to maintain progress from PT. Baseline: HEP provided at eval Goal status: INITIAL  2.  *** Baseline:  Goal status: INITIAL  3.  *** Baseline:  Goal status: INITIAL  4.  *** Baseline:  Goal status: INITIAL   PLAN: PT FREQUENCY: {rehab frequency:25116}  PT DURATION: {rehab duration:25117}  PLANNED INTERVENTIONS: {rehab planned interventions:25118::"Therapeutic exercises","Therapeutic activity","Neuromuscular re-education","Balance training","Gait training","Patient/Family education","Self Care","Joint mobilization"}  PLAN FOR NEXT SESSION: Review HEP and progress PRN, ***   Hilda Blades, PT, DPT, LAT, ATC 09/15/21  1:36 PM Phone: [phone] Fax: [phone]

## 2021-09-18 ENCOUNTER — Other Ambulatory Visit: Payer: Self-pay

## 2021-09-18 ENCOUNTER — Encounter: Payer: Self-pay | Admitting: Physical Therapy

## 2021-09-18 ENCOUNTER — Ambulatory Visit: Payer: Medicaid Other | Attending: Orthopedic Surgery | Admitting: Physical Therapy

## 2021-09-18 DIAGNOSIS — M25511 Pain in right shoulder: Secondary | ICD-10-CM | POA: Diagnosis present

## 2021-09-18 DIAGNOSIS — G8929 Other chronic pain: Secondary | ICD-10-CM | POA: Insufficient documentation

## 2021-09-18 DIAGNOSIS — M6281 Muscle weakness (generalized): Secondary | ICD-10-CM | POA: Diagnosis present

## 2021-09-18 NOTE — Patient Instructions (Signed)
Access Code: [email or web address] URL: [email or web address] Date: 09/18/2021 Prepared by: Hilda Blades  Exercises - Supine Shoulder Press with Dowel  - 2-3 x daily - 10 reps - Sidelying Shoulder External Rotation  - 2-3 x daily - 30 reps - Sidelying Shoulder Abduction Palm Forward  - 2-3 x daily - 10 reps - Seated Scapular Retraction  - 2-3 x weekly - 10 reps

## 2021-09-26 ENCOUNTER — Ambulatory Visit: Payer: Medicaid Other | Admitting: Physical Therapy

## 2021-10-03 ENCOUNTER — Encounter: Payer: Medicaid Other | Admitting: Physical Therapy

## 2021-10-10 ENCOUNTER — Ambulatory Visit: Payer: Medicaid Other | Attending: Endocrinology | Admitting: Physical Therapy

## 2021-10-10 ENCOUNTER — Ambulatory Visit: Payer: Medicaid Other | Admitting: Physical Therapy

## 2021-10-10 ENCOUNTER — Encounter: Payer: Self-pay | Admitting: Physical Therapy

## 2021-10-10 ENCOUNTER — Other Ambulatory Visit: Payer: Self-pay

## 2021-10-10 DIAGNOSIS — M25511 Pain in right shoulder: Secondary | ICD-10-CM | POA: Insufficient documentation

## 2021-10-10 DIAGNOSIS — M6281 Muscle weakness (generalized): Secondary | ICD-10-CM | POA: Diagnosis present

## 2021-10-10 DIAGNOSIS — G8929 Other chronic pain: Secondary | ICD-10-CM | POA: Insufficient documentation

## 2021-10-10 NOTE — Patient Instructions (Signed)
Access Code: [email or web address] URL: [email or web address] Date: 10/10/2021 Prepared by: Hilda Blades  Exercises - Supine Shoulder Flexion Extension AAROM with Dowel  - 2-3 x daily - 20 reps - Sidelying Shoulder External Rotation  - 2-3 x daily - 30 reps - Sidelying Shoulder Abduction Palm Forward  - 2-3 x daily - 20 reps - Seated Scapular Retraction  - 2-3 x weekly - 20 reps - Seated Shoulder Flexion AAROM with Dowel  - 2-3 x daily - 20 reps

## 2021-10-10 NOTE — Therapy (Addendum)
OUTPATIENT PHYSICAL THERAPY TREATMENT NOTE  DISCHARGE   Patient Name: Tina Bryant MRN: [id number] DOB:[birth year], 76 y.o., female Today's Date: 10/10/2021  PCP: Kristie Cowman, MD   REFERRING PROVIDER: Isabella Stalling, MD   END OF SESSION:   PT End of Session - 10/10/21 1416     Visit Number 2    Number of Visits 9    Date for PT Re-Evaluation 11/13/21    Authorization Type [employer]    Authorization Time Period 09/26/2021 - 11/24/2021    Authorization - Visit Number 1    Authorization - Number of Visits 7    PT Start Time 1400    PT Stop Time 1440    PT Time Calculation (min) 40 min    Activity Tolerance Patient tolerated treatment well    Behavior During Therapy [facility] for tasks assessed/performed             Past Medical History:  Diagnosis Date   Diabetes mellitus without complication ([place])    borderline   Hypertension    History reviewed. No pertinent surgical history. Patient Active Problem List   Diagnosis Date Noted   HYPERLIPIDEMIA 07/09/2006   ANEMIA-IRON DEFICIENCY 07/09/2006   DEPRESSION 07/09/2006   HYPERTENSION 07/09/2006    REFERRING DIAG: Right shoulder pain  THERAPY DIAG:  Chronic right shoulder pain  Muscle weakness (generalized)  Rationale for Evaluation and Treatment Rehabilitation  PERTINENT HISTORY: None  PRECAUTIONS: None   SUBJECTIVE: Patient reports her shoulder is doing better. She has been doing the exercises and going to the gym.  PAIN:  Are you having pain? Yes:  NPRS scale: 5/10 Pain location: Right shoulder Pain description: Sore, sharp, weak Aggravating factors: Raising right arm, lifting, pushing up from chair, sleeping Relieving factors: Rest   OBJECTIVE: (objective measures completed at initial evaluation unless otherwise dated) PATIENT SURVEYS:  Quick Dash 59.1% disability   POSTURE: Rounded shoulder posture   UPPER EXTREMITY ROM:    Active ROM Right eval Left eval Right 10/10/2021   Shoulder flexion 90 - 160  Shoulder extension 30 -   Shoulder abduction 70 -   Shoulder internal rotation HBB Sacrum -   Shoulder external rotation HBH Unable -   Patient exhibits significant shoulder shrug and reports pain with elevation   Right shoulder PROM grossly WFL    UPPER EXTREMITY MMT:   MMT Right eval Left eval  Shoulder flexion 3- -  Shoulder extension 4- -  Shoulder abduction 3- -  Shoulder internal rotation 4 -  Shoulder external rotation 4- -    JOINT MOBILITY TESTING:  Grossly WFL   PALPATION:  Patient reports tenderness of para-acromial region, posterior cuff                TODAY'S TREATMENT: [facility] Adult PT Treatment:                                                DATE: 10/10/2021 Therapeutic Exercise: UBE L1 x 4 min (2 fwd/bwd) while taking subjective Supine dowel shoulder flexion x 10 Supine shoulder flexion with 1# 2 x 10 Sidelying shoulder abduction with 1# 2 x 10 Sidelying ER with 1# 2 x 10 Seated shoulder flexion physioball rollout x 10 Seated shoulder overhead pulleys x 4 min Seated row with red 2 x 10 Seated dowel shoulder flexion 2 x 10  [facility] Adult PT Treatment:                                                DATE: 09/18/2021 Therapeutic Exercise: Supine dowel press to overhead x 10 Sidelying ER x 20 Sidelying abduction x 10 Seated shoulder blade squeezes x 10   PATIENT EDUCATION: Education details: HEP Person educated: Patient Education method: Consulting civil engineer, Demonstration, Corporate treasurer cues, Verbal cues Education comprehension: verbalized understanding, returned demonstration, verbal cues required, tactile cues required, and needs further education   HOME EXERCISE PROGRAM: Access Code: [email or web address]     ASSESSMENT: CLINICAL IMPRESSION: Patient tolerated therapy well with no adverse effects. Therapy focused on progressing her shoulder mobility and strength with good tolerance. She does demonstrate much improved shoulder flexion AROM and is  progressing with resistance exercises without any increased pain. Updated HEP to progress her shoulder motion. Patient would benefit from continue skilled PT to progress her mobility and strength in order to reduce pain and maximize functional ability.     OBJECTIVE IMPAIRMENTS decreased ROM, decreased strength, impaired UE functional use, postural dysfunction, and pain.    ACTIVITY LIMITATIONS carrying, lifting, sleeping, bathing, dressing, reach over head, and hygiene/grooming   PARTICIPATION LIMITATIONS: meal prep, cleaning, laundry, and shopping   PERSONAL FACTORS Age, Fitness, Past/current experiences, and Time since onset of injury/illness/exacerbation are also affecting patient's functional outcome.      GOALS: Goals reviewed with patient? Yes   SHORT TERM GOALS: Target date: 10/16/2021    Patient will be I with initial HEP in order to progress with therapy and improve shoulder motion. Baseline: HEP provided at eval Goal status: INITIAL   2.  Patient will report right shoulder pain with raising right arm </= 6/10 in order to reduce functional limitations and all improve ability to reach into higher cabinet Baseline: right shoulder pain 9/10 Goal status: INITIAL   3.  Patient will demonstrate right shoulder elevation AROM >/= 110 deg in order to improve ability to perform grooming and other self care tasks.  Baseline: right shoulder elevation AROM 90 deg 10/10/2021: 160 deg Goal status: MET   LONG TERM GOALS: Target date: 11/13/2021    Patient will be I with final HEP to maintain progress from PT and progress her shoulder strength and motion to perform household tasks without limitation. Baseline: HEP provided at eval Goal status: INITIAL   2.  Patient will report QuickDASH </= 45% disability to indicate improved functional use of the right shoulder with daily and household tasks. Baseline: 59.1% disability Goal status: INITIAL   3.  Patient will demonstrate right shoulder  elevation AROM >/= 130 deg in order to improve ability to reach in an upper shelf and performing grooming tasks Baseline: right shoulder elevation AROM 90 deg 10/10/2021: 160 deg Goal status: MET   4.  Patient will be able to perform IR functional reach behind back to L1 in order to improve dressing ability Baseline: IR functional reach behind back to Sacrum Goal status: INITIAL   5.  Patient will demonstrate right shoulder strength >/= 4/5 MMT in order to improve lifting or carrying tasks using the right arm for shopping or household tasks Baseline: patient demonstrates weakness of the right shoulder (see above) Goal status: INITIAL     PLAN: PT FREQUENCY: 1x/week   PT DURATION: 8 weeks   PLANNED INTERVENTIONS: Therapeutic exercises, Therapeutic activity,  Neuromuscular re-education, Balance training, Gait training, Patient/Family education, Self Care, Joint mobilization, Joint manipulation, Aquatic Therapy, Dry Needling, Cryotherapy, Moist heat, Manual therapy, and Re-evaluation   PLAN FOR NEXT SESSION: Review HEP and progress PRN, progress right shoulder AAROM to AROM as tolerated, progress strengthening of right shoulder as tolerated    Hilda Blades, PT, DPT, LAT, ATC 10/10/21  2:44 PM Phone: [phone] Fax: [phone]   PHYSICAL THERAPY DISCHARGE SUMMARY  Visits from Start of Care: 2  Current functional level related to goals / functional outcomes: See above   Remaining deficits: See above   Education / Equipment: HEP   Patient agrees to discharge. Patient goals were not met. Patient is being discharged due to not returning since the last visit.  Hilda Blades, PT, DPT, LAT, ATC 12/08/21  9:39 AM Phone: [phone] Fax: [phone]

## 2021-10-17 ENCOUNTER — Encounter: Payer: Medicaid Other | Admitting: Physical Therapy

## 2022-06-07 ENCOUNTER — Other Ambulatory Visit: Payer: Self-pay

## 2022-06-07 ENCOUNTER — Emergency Department (HOSPITAL_COMMUNITY)
Admission: EM | Admit: 2022-06-07 | Discharge: 2022-06-07 | Disposition: A | Discharge status: Home / Self Care | Payer: Medicaid Other | Attending: Emergency Medicine | Admitting: Emergency Medicine

## 2022-06-07 ENCOUNTER — Encounter (HOSPITAL_COMMUNITY): Payer: Self-pay

## 2022-06-07 DIAGNOSIS — I129 Hypertensive chronic kidney disease with stage 1 through stage 4 chronic kidney disease, or unspecified chronic kidney disease: Secondary | ICD-10-CM | POA: Diagnosis not present

## 2022-06-07 DIAGNOSIS — N189 Chronic kidney disease, unspecified: Secondary | ICD-10-CM | POA: Insufficient documentation

## 2022-06-07 DIAGNOSIS — E1122 Type 2 diabetes mellitus with diabetic chronic kidney disease: Secondary | ICD-10-CM | POA: Diagnosis not present

## 2022-06-07 DIAGNOSIS — M7989 Other specified soft tissue disorders: Secondary | ICD-10-CM | POA: Insufficient documentation

## 2022-06-07 DIAGNOSIS — Z79899 Other long term (current) drug therapy: Secondary | ICD-10-CM | POA: Diagnosis not present

## 2022-06-07 NOTE — Discharge Instructions (Addendum)
You were seen today for a possible blood clot.  Unfortunately we were not able to see the results of your ultrasound.  We ordered a repeat ultrasound.  We recommend you call your doctor first thing in the morning.  If you cannot get a hold of them or they recommend you go to the ED then you should return here.  You can return here anytime after 7 AM tomorrow, at check-in you should state you are here for a vascular ultrasound.  If you develop worsening swelling, severe pain, chest pain, difficulty breathing you should return to the ED.

## 2022-06-07 NOTE — ED Triage Notes (Signed)
Pt states her PCP sent her here to r/o DVT in left leg. Pt c/o swelling of left footx3-4wks. Pt denies pain. Pt has 1+ swelling of left foot. Pt has 1+ left pedal pulse, cap refill less than 3 sec, warm to touch.

## 2022-06-07 NOTE — ED Provider Notes (Signed)
[place] EMERGENCY DEPARTMENT AT [facility] Provider Note   CSN: [id number] Arrival date & time: 06/07/22  1439     History  No chief complaint on file.   Tina Bryant is a 77 y.o. female.  HPI 77 year old female history of CKD, hypertension, type 2 diabetes, hyperlipidemia presenting for possible blood clot.  Patient states for last 3 to 4 weeks has had mild swelling to her left foot.  It has improved.  She states she saw a vascular clinic today to have an ultrasound performed.  She reports that her doctor called her and told her to go to the ED because she had a blood clot in her left leg.  She was not told where the blood clot was, why she was sent to the ED, or anything else.  She does not have any report of the ultrasound or its findings.  She feels well.  She has no pain in either leg.  No weakness or numbness.  She has no chest pain or difficulty breathing.  She is not on anticoagulation.  She has no history of DVT or PE as far she is aware.  She has had no recent travel.  No cough or fever or hemoptysis.     Home Medications Prior to Admission medications   Medication Sig Start Date End Date Taking? Authorizing Provider  ATORVASTATIN CALCIUM PO Take by mouth.    [provider]  LISINOPRIL PO Take by mouth.    [provider]  SPIRONOLACTONE PO Take by mouth.    [provider]      Allergies    Tramadol    Review of Systems   Review of Systems  Cardiovascular:  Positive for leg swelling.  All other systems reviewed and are negative.   Physical Exam Updated Vital Signs BP (!) 148/90 (BP Location: Right Arm)   Pulse 78   Temp 98.2 F (36.8 C) (Oral)   Resp 16   Ht 5\' 7"  (1.702 m)   Wt 79.2 kg   SpO2 100%   BMI 27.35 kg/m  Physical Exam Vitals and nursing note reviewed.  Constitutional:      General: She is not in acute distress.    Appearance: She is well-developed.  HENT:     Head: Normocephalic and atraumatic.   Eyes:     Conjunctiva/sclera: Conjunctivae normal.  Cardiovascular:     Rate and Rhythm: Normal rate and regular rhythm.     Heart sounds: No murmur heard. Pulmonary:     Effort: Pulmonary effort is normal. No respiratory distress.     Breath sounds: Normal breath sounds.  Abdominal:     Palpations: Abdomen is soft.     Tenderness: There is no abdominal tenderness.  Musculoskeletal:        General: No swelling.     Cervical back: Neck supple.     Comments: Subtle fullness around the left ankle compared to the right.  She has 2+ DP and PT pulses bilaterally.  She has full strength and normal sensation of bilateral lower extremities.  No phlegmasia, skin changes, or wounds.  Skin:    General: Skin is warm and dry.     Capillary Refill: Capillary refill takes less than 2 seconds.  Neurological:     General: No focal deficit present.     Mental Status: She is alert and oriented to person, place, and time. Mental status is at baseline.  Psychiatric:        Mood  and Affect: Mood normal.     ED Results / Procedures / Treatments   Labs (all labs ordered are listed, but only abnormal results are displayed) Labs Reviewed - No data to display  EKG None  Radiology No results found.  Procedures Procedures    Medications Ordered in ED Medications - No data to display  ED Course/ Medical Decision Making/ A&P                             Medical Decision Making  77 year old female presenting for left leg swelling and possible blood clot.  Vital signs reviewed.  On exam patient is well-appearing, she is asymptomatic other than subtle left ankle swelling which is improved from prior has been going on for several weeks.  She reports being told by her PCP that she had an ultrasound concerning for DVT today.  Unfortunate unable to see any of the results and she has no access to her medical chart.  I am unable to find any of the records from her PCP here.  I called her PCPs office  multiple times to try to get a hold of someone to get these records and was unable to reach anyone.  Clinically, she has minimal swelling, no signs of vascular compromise, infection, or phlegmasia.  I have low concern for arterial abnormality, PE, or large proximal clot that would require heparin drip and admission to the hospital.  I consider empirically treating her, however given I do not have the report I am not sure that there is an indication to treat this reported DVT, she baseline has difficulty ambulating is a fall risk.  Unfortunately overnight I cannot get a repeat DVT study.  I did order a DVT study to be done outpatient here tomorrow.  I recommend she call her doctor in the morning and return to the ED for formal repeat ultrasound to evaluate for DVT and determine if treatment is needed and what kind of treatment she needs.  I had a discussion with her family over the phone as well.  She has a friend who was able to take her home tonight and bring her back to the ED tomorrow morning.  I gave her strict return precautions for worsening pain, numbness, swelling, difficulty breathing, or chest pain.  She was comfortable this plan.  She was discharged home in stable condition.        Final Clinical Impression(s) / ED Diagnoses Final diagnoses:  Left leg swelling    Rx / DC Orders ED Discharge Orders          Ordered    LE VENOUS        06/07/22 2248              Fulton Reek, MD 06/07/22 2321    Rozelle Logan, DO 06/07/22 2337

## 2022-06-08 ENCOUNTER — Encounter (HOSPITAL_COMMUNITY): Payer: Medicaid Other

## 2022-06-08 ENCOUNTER — Ambulatory Visit (HOSPITAL_BASED_OUTPATIENT_CLINIC_OR_DEPARTMENT_OTHER)
Admission: RE | Admit: 2022-06-08 | Discharge: 2022-06-08 | Disposition: A | Discharge status: Home / Self Care | Payer: Medicaid Other | Source: Ambulatory Visit | Attending: Vascular Surgery | Admitting: Vascular Surgery

## 2022-06-08 ENCOUNTER — Other Ambulatory Visit (HOSPITAL_COMMUNITY): Payer: Self-pay

## 2022-06-08 ENCOUNTER — Emergency Department (HOSPITAL_COMMUNITY): Admission: EM | Admit: 2022-06-08 | Payer: Medicaid Other | Source: Home / Self Care

## 2022-06-08 ENCOUNTER — Encounter (HOSPITAL_COMMUNITY): Payer: Self-pay

## 2022-06-08 ENCOUNTER — Ambulatory Visit (HOSPITAL_COMMUNITY)
Admission: RE | Admit: 2022-06-08 | Discharge: 2022-06-08 | Disposition: A | Discharge status: Home / Self Care | Payer: Medicaid Other | Source: Ambulatory Visit | Attending: Emergency Medicine | Admitting: Emergency Medicine

## 2022-06-08 VITALS — BP 116/64 | HR 74

## 2022-06-08 DIAGNOSIS — I82412 Acute embolism and thrombosis of left femoral vein: Secondary | ICD-10-CM | POA: Diagnosis not present

## 2022-06-08 DIAGNOSIS — M7989 Other specified soft tissue disorders: Secondary | ICD-10-CM

## 2022-06-08 LAB — CBC
HCT: 35.7 % — ABNORMAL LOW (ref 36.0–46.0)
Hemoglobin: 10.7 g/dL — ABNORMAL LOW (ref 12.0–15.0)
MCH: 21.4 pg — ABNORMAL LOW (ref 26.0–34.0)
MCHC: 30 g/dL (ref 30.0–36.0)
MCV: 71.4 fL — ABNORMAL LOW (ref 80.0–100.0)
Platelets: 228 10*3/uL (ref 150–400)
RBC: 5 MIL/uL (ref 3.87–5.11)
RDW: 16.9 % — ABNORMAL HIGH (ref 11.5–15.5)
WBC: 8.5 10*3/uL (ref 4.0–10.5)
nRBC: 0 % (ref 0.0–0.2)

## 2022-06-08 LAB — COMPREHENSIVE METABOLIC PANEL
ALT: 23 U/L (ref 0–44)
AST: 22 U/L (ref 15–41)
Albumin: 3.7 g/dL (ref 3.5–5.0)
Alkaline Phosphatase: 79 U/L (ref 38–126)
Anion gap: 8 (ref 5–15)
BUN: 41 mg/dL — ABNORMAL HIGH (ref 8–23)
CO2: 23 mmol/L (ref 22–32)
Calcium: 9.6 mg/dL (ref 8.9–10.3)
Chloride: 100 mmol/L (ref 98–111)
Creatinine, Ser: 1.37 mg/dL — ABNORMAL HIGH (ref 0.44–1.00)
GFR, Estimated: 40 mL/min — ABNORMAL LOW (ref >60)
Glucose, Bld: 115 mg/dL — ABNORMAL HIGH (ref 70–99)
Potassium: 4.1 mmol/L (ref 3.5–5.1)
Sodium: 131 mmol/L — ABNORMAL LOW (ref 135–145)
Total Bilirubin: 0.6 mg/dL (ref 0.3–1.2)
Total Protein: 6.8 g/dL (ref 6.5–8.1)

## 2022-06-08 MED ORDER — APIXABAN (ELIQUIS) VTE STARTER PACK (10MG AND 5MG)
ORAL_TABLET | ORAL | 0 refills | Status: DC
  Filled 2022-06-08: qty 74, 30d supply, fill #0

## 2022-06-08 MED ORDER — APIXABAN 5 MG PO TABS: 5.0000 mg | ORAL_TABLET | Freq: Two times a day (BID) | ORAL | 5 refills | Status: DC

## 2022-06-08 NOTE — ED Provider Notes (Signed)
Patient showed up to ER for her next day DVT study by mistake. She has no acute symptoms. Given this was a mistake and she didn't want to be an ER patient, she was unregistered and will go to get her outpatient DVT study. No ED charge necessary.   Pricilla Loveless, MD 06/08/22 [phone]

## 2022-06-08 NOTE — Patient Instructions (Signed)
-  Start apixaban (Eliquis) 10 mg twice daily for 7 days followed by 5 mg twice daily. -Your refills have been sent to [facility]. You may need to call the pharmacy to ask them to fill this when you start to run low on your current supply.  -It is important to take your medication around the same time every day.  -Avoid NSAIDs like ibuprofen (Advil, Motrin) and naproxen (Aleve) as well as aspirin doses over 100 mg daily. -Tylenol (acetaminophen) is the preferred over the counter pain medication to lower the risk of bleeding. -Be sure to alert all of your health care providers that you are taking an anticoagulant prior to starting a new medication or having a procedure. -Monitor for signs and symptoms of bleeding (abnormal bruising, prolonged bleeding, nose bleeds, bleeding from gums, discolored urine, black tarry stools). If you have fallen and hit your head OR if your bleeding is severe or not stopping, seek emergency care.  -Go to the emergency room if emergent signs and symptoms of new clot occur (new or worse swelling and pain in an arm or leg, shortness of breath, chest pain, fast or irregular heartbeats, lightheadedness, dizziness, fainting, coughing up blood) or if you experience a significant color change (pale or blue) in the extremity that has the DVT.  -We recommend you wear compression stockings as long as you are having swelling or pain. Be sure to purchase the correct size and take them off at night.   Your next visit is on July 17th at 1:30pm.  [facility] [address] [place], [place], [place] [address] Enter the hospital through Entrance C off [facility] and pull up to the [facility] entrance to the free valet parking.  Check in for your appointment at the [facility] V[facility].   If you have any questions or need to reschedule an appointment, please call [phone] [facility].  If you are having an emergency, call 911 or present to the  nearest emergency room.   What is a DVT?  -Deep vein thrombosis (DVT) is a condition in which a blood clot forms in a vein of the deep venous system which can occur in the lower leg, thigh, pelvis, arm, or neck. This condition is serious and can be life-threatening if the clot travels to the arteries of the lungs and causing a blockage (pulmonary embolism, PE). A DVT can also damage veins in the leg, which can lead to long-term venous disease, leg pain, swelling, discoloration, and ulcers or sores (post-thrombotic syndrome).  -Treatment may include taking an anticoagulant medication to prevent more clots from forming and the current clot from growing, wearing compression stockings, and/or surgical procedures to remove or dissolve the clot.

## 2022-06-08 NOTE — Progress Notes (Signed)
Lower extremity venous left study completed.   Please see CV Proc for preliminary results. Patient sent to DVT clinic for further management.   Jean Rosenthal, RDMS, RVT

## 2022-06-08 NOTE — ED Triage Notes (Signed)
Pt. Stated, Im here for [place]  cause Ive had rt. Foot swollen for a month. Left is ok.

## 2022-06-08 NOTE — Progress Notes (Signed)
DVT Clinic Note  Name: Tina Bryant     MRN: [id number]     DOB: [birth year]     Sex: female  PCP: Felix Pacini, FNP  Today's Visit: Visit Information: Initial Visit  Referred to DVT Clinic by: Dr. Earlene Plater [facility])  Referred to CPP by: Dr. Chestine Spore Reason for referral:  Chief Complaint  Patient presents with   DVT   HISTORY OF PRESENT ILLNESS: Tina Bryant is a 77 y.o. female with PMH HTN, HLD, CKD, T2DM, who presents after diagnosis of DVT for medication management. Patient is in a wheelchair and accompanied today by her daughter's friend. Patient presented to the [facility] ED 05/28/22 with left leg swelling and said her PCP told her she had a blood clot and needed to come to the ED. The ED was unable to see any prior imaging and ordered an [place] which was completed as an outpatient today. This was positive for acute DVT involving the left femoral, popliteal, and peroneal veins. She was referred to the DVT Clinic to start treatment. Patient reports that she began to have swelling in her left foot about a month ago. Denies ever having any pain, cramps, or swelling in her leg. Only experienced swelling in the foot which has improved with elevation. Patient reports she lives at home and is active around her house throughout the day and participates in chair exercises with parks and rec on Fridays.   Positive Thrombotic Risk Factors: Older Age Bleeding Risk Factors: Age >65 years, Anemia  Negative Thrombotic Risk Factors: Previous VTE, Recent surgery (within 3 months), Recent trauma (within 3 months), Recent admission to hospital with acute illness (within 3 months), Paralysis, paresis, or recent plaster cast immobilization of lower extremity, Central venous catheterization, Sedentary journey lasting >8 hours within 4 weeks, Pregnancy, Testosterone therapy, Estrogen therapy, Active cancer, Smoking, Obesity, Known thrombophilic condition, Recent COVID diagnosis (within  3 months), Recent cesarean section (within 3 months), Bed rest >72 hours within 3 months, Within 6 weeks postpartum, Erythropoiesis-stimulating agent, Non-malignant, chronic inflammatory condition  Rx Insurance Coverage: Medicaid Rx Affordability: Eliquis is $4/month  Preferred Pharmacy: Eliquis starter pack filled today at [facility] during the visit. Refills sent to patient's preferred pharmacy, [facility].   Past Medical History:  Diagnosis Date   Diabetes mellitus without complication (HCC)    borderline   Hypertension     No past surgical history on file.  Social History   Socioeconomic History   Marital status: Widowed    Spouse name: Not on file   Number of children: Not on file   Years of education: Not on file   Highest education level: Not on file  Occupational History   Not on file  Tobacco Use   Smoking status: Never   Smokeless tobacco: Not on file  Substance and Sexual Activity   Alcohol use: No   Drug use: No   Sexual activity: Not on file  Other Topics Concern   Not on file  Social History Narrative   Not on file   Social Determinants of Health   Financial Resource Strain: Not on file  Food Insecurity: Not on file  Transportation Needs: Not on file  Physical Activity: Not on file  Stress: Not on file  Social Connections: Not on file  Intimate Partner Violence: Not on file    No family history on file.  Allergies as of 06/08/2022 - Review Complete 06/08/2022  Allergen Reaction Noted   Lisinopril  Cough 06/08/2022    Current Outpatient Medications on File Prior to Encounter  Medication Sig Dispense Refill   atorvastatin (LIPITOR) 40 MG tablet Take 40 mg by mouth daily.     Cholecalciferol (VITAMIN D-1000 MAX ST) 25 MCG (1000 UT) tablet Take 1,000 Units by mouth daily.     diclofenac Sodium (VOLTAREN) 1 % GEL Apply 2 g topically 4 (four) times daily.     Flaxseed, Linseed, (FLAXSEED OIL PO) Take 1 capsule by mouth daily.      losartan (COZAAR) 25 MG tablet Take 25 mg by mouth daily.     Multiple Vitamins-Minerals (CENTRUM SILVER PO) Take 1 tablet by mouth daily.     spironolactone-hydrochlorothiazide (ALDACTAZIDE) 25-25 MG tablet Take 1 tablet by mouth daily.     No current facility-administered medications on file prior to encounter.   REVIEW OF SYSTEMS:  Review of Systems  Respiratory:  Negative for shortness of breath.   Cardiovascular:  Positive for leg swelling (L foot). Negative for chest pain and palpitations.  Musculoskeletal:  Negative for myalgias.  Neurological:  Negative for dizziness and tingling.   PHYSICAL EXAMINATION:  Vitals:   06/08/22 1117  BP: 116/64  Pulse: 74  SpO2: 100%   Physical Exam Vitals reviewed.  Cardiovascular:     Rate and Rhythm: Normal rate.  Pulmonary:     Effort: Pulmonary effort is normal.  Musculoskeletal:        General: No tenderness.     Left lower leg: Edema (mild swelling around the foot) present.  Skin:    Findings: No bruising or erythema.  Psychiatric:        Mood and Affect: Mood normal.        Behavior: Behavior normal.        Thought Content: Thought content normal.   Villalta Score for Post-Thrombotic Syndrome: Pain: Mild Cramps: Absent Heaviness: Absent Paresthesia: Absent Pruritus: Absent Pretibial Edema: Mild Skin Induration: Absent Hyperpigmentation: Absent Redness: Absent Venous Ectasia: Absent Pain on calf compression: Absent Villalta Preliminary Score: 2 Is venous ulcer present?: No If venous ulcer is present and score is <15, then 15 points total are assigned: Absent Villalta Total Score: 2  LABS:  CBC     Component Value Date/Time   WBC 8.5 06/08/2022 1200   RBC 5.00 06/08/2022 1200   HGB 10.7 (L) 06/08/2022 1200   HCT 35.7 (L) 06/08/2022 1200   PLT 228 06/08/2022 1200   MCV 71.4 (L) 06/08/2022 1200   MCH 21.4 (L) 06/08/2022 1200   MCHC 30.0 06/08/2022 1200   RDW 16.9 (H) 06/08/2022 1200   LYMPHSABS 1.8 03/12/2007  2119   MONOABS 0.5 03/12/2007 2119   EOSABS 0.2 03/12/2007 2119   BASOSABS 0.0 03/12/2007 2119    Hepatic Function      Component Value Date/Time   PROT 5.3 (L) 03/12/2007 2119   ALBUMIN 2.8 (L) 03/12/2007 2119   AST 23 03/12/2007 2119   ALT 21 03/12/2007 2119   ALKPHOS 60 03/12/2007 2119   BILITOT 0.3 03/12/2007 2119    Renal Function   Lab Results  Component Value Date   CREATININE 0.80 03/12/2007    CrCl cannot be calculated (Patient's most recent lab result is older than the maximum 21 days allowed.).   VVS Vascular Lab Studies:  06/08/22 VAS [place] LOWER EXTREMITY VENOUS LEFT (DVT) Summary:  RIGHT:  - No evidence of common femoral vein obstruction.    LEFT:  - Findings consistent with acute deep vein thrombosis involving the left  femoral vein, left popliteal vein, and left peroneal veins.  - No cystic structure found in the popliteal fossa.   ASSESSMENT: Location of DVT: Left femoral vein, Left popliteal vein, Left distal vein Cause of DVT: unprovoked - no provoking risk factors per patient history today. Will start anticoagulation and refer the patient to hematology for further work up and recommendations on duration of treatment. No recent labs were available so a CBC and CMET were checked. Will reach out to the patient with any abnormal results for PCP follow up. No concerns with starting Eliquis today.   PLAN: -Start apixaban (Eliquis) 10 mg twice daily for 7 days followed by 5 mg twice daily. -Expected duration of therapy: Likely indefinite given unprovoked but will defer to hematology for further recommendations. Therapy started on 06/08/22. -Patient educated on purpose, proper use and potential adverse effects of apixaban (Eliquis). -Discussed importance of taking medication around the same time every day. -Advised patient of medications to avoid (NSAIDs, aspirin doses >100 mg daily). -Educated that Tylenol (acetaminophen) is the preferred analgesic to lower the risk  of bleeding. -Advised patient to alert all providers of anticoagulation therapy prior to starting a new medication or having a procedure. -Emphasized importance of monitoring for signs and symptoms of bleeding (abnormal bruising, prolonged bleeding, nose bleeds, bleeding from gums, discolored urine, black tarry stools). -Educated patient to present to the ED if emergent signs and symptoms of new thrombosis occur. -Counseled patient to wear compression stockings daily, removing at night. Encouraged continued elevation to help improve swelling as well. -Refills sent to patient's preferred pharmacy.   Follow up: 6 weeks in DVT Clinic. Referral to hematology placed.   Pervis Hocking, PharmD, [place]y [place], CPP Deep Vein Thrombosis Clinic Clinical Pharmacist Practitioner Office: [phone]

## 2022-06-11 ENCOUNTER — Telehealth: Payer: Self-pay | Admitting: Hematology and Oncology

## 2022-06-11 NOTE — Telephone Encounter (Signed)
scheduled per referral, pt has been called and confirmed date and time. Pt is aware of location and to arrive early for check in , also sent letter per pt request 

## 2022-07-06 ENCOUNTER — Telehealth: Payer: Self-pay | Admitting: Internal Medicine

## 2022-07-09 ENCOUNTER — Other Ambulatory Visit: Payer: Medicaid Other

## 2022-07-09 ENCOUNTER — Encounter: Payer: Medicaid Other | Admitting: Hematology and Oncology

## 2022-07-18 ENCOUNTER — Inpatient Hospital Stay (HOSPITAL_BASED_OUTPATIENT_CLINIC_OR_DEPARTMENT_OTHER): Payer: Medicaid Other

## 2022-07-18 ENCOUNTER — Inpatient Hospital Stay: Payer: Medicaid Other | Attending: Hematology and Oncology | Admitting: Internal Medicine

## 2022-07-18 ENCOUNTER — Other Ambulatory Visit: Payer: Self-pay | Admitting: Medical Oncology

## 2022-07-18 ENCOUNTER — Other Ambulatory Visit: Payer: Self-pay

## 2022-07-18 VITALS — BP 136/73 | HR 93 | Temp 98.1°F | Resp 16 | Ht 65.0 in | Wt 188.3 lb

## 2022-07-18 DIAGNOSIS — I82412 Acute embolism and thrombosis of left femoral vein: Secondary | ICD-10-CM | POA: Diagnosis not present

## 2022-07-18 DIAGNOSIS — I82402 Acute embolism and thrombosis of unspecified deep veins of left lower extremity: Secondary | ICD-10-CM | POA: Insufficient documentation

## 2022-07-18 DIAGNOSIS — D509 Iron deficiency anemia, unspecified: Secondary | ICD-10-CM

## 2022-07-18 LAB — CBC WITH DIFFERENTIAL (CANCER CENTER ONLY)
Abs Immature Granulocytes: 0.04 10*3/uL (ref 0.00–0.07)
Basophils Absolute: 0 10*3/uL (ref 0.0–0.1)
Basophils Relative: 0 %
Eosinophils Absolute: 0.1 10*3/uL (ref 0.0–0.5)
Eosinophils Relative: 1 %
HCT: 33.4 % — ABNORMAL LOW (ref 36.0–46.0)
Hemoglobin: 10 g/dL — ABNORMAL LOW (ref 12.0–15.0)
Immature Granulocytes: 1 %
Lymphocytes Relative: 27 %
Lymphs Abs: 1.9 10*3/uL (ref 0.7–4.0)
MCH: 21.6 pg — ABNORMAL LOW (ref 26.0–34.0)
MCHC: 29.9 g/dL — ABNORMAL LOW (ref 30.0–36.0)
MCV: 72 fL — ABNORMAL LOW (ref 80.0–100.0)
Monocytes Absolute: 0.8 10*3/uL (ref 0.1–1.0)
Monocytes Relative: 11 %
Neutro Abs: 4.3 10*3/uL (ref 1.7–7.7)
Neutrophils Relative %: 60 %
Platelet Count: 238 10*3/uL (ref 150–400)
RBC: 4.64 MIL/uL (ref 3.87–5.11)
RDW: 17.2 % — ABNORMAL HIGH (ref 11.5–15.5)
WBC Count: 7.1 10*3/uL (ref 4.0–10.5)
nRBC: 0 % (ref 0.0–0.2)

## 2022-07-18 LAB — CMP (CANCER CENTER ONLY)
ALT: 17 U/L (ref 0–44)
AST: 19 U/L (ref 15–41)
Albumin: 4 g/dL (ref 3.5–5.0)
Alkaline Phosphatase: 68 U/L (ref 38–126)
Anion gap: 7 (ref 5–15)
BUN: 27 mg/dL — ABNORMAL HIGH (ref 8–23)
CO2: 28 mmol/L (ref 22–32)
Calcium: 10 mg/dL (ref 8.9–10.3)
Chloride: 105 mmol/L (ref 98–111)
Creatinine: 1.24 mg/dL — ABNORMAL HIGH (ref 0.44–1.00)
GFR, Estimated: 45 mL/min — ABNORMAL LOW (ref >60)
Glucose, Bld: 91 mg/dL (ref 70–99)
Potassium: 4.8 mmol/L (ref 3.5–5.1)
Sodium: 140 mmol/L (ref 135–145)
Total Bilirubin: 0.5 mg/dL (ref 0.3–1.2)
Total Protein: 7.1 g/dL (ref 6.5–8.1)

## 2022-07-18 NOTE — Progress Notes (Signed)
[facility] [phone]   [phone]  CONSULT NOTE  REFERRING PHYSICIAN: Felix Pacini, FNP  REASON FOR CONSULTATION:  77 years old African-American female recently diagnosed with deep venous thrombosis.  HPI Tina Bryant is a 77 y.o. female with past medical history significant for hypertension, chronic kidney disease, dyslipidemia and diabetes mellitus.  The patient presented to the emergency department on Jun 07, 2022 complaining of swelling of the left foot.  She was evaluated at the vascular clinic and had Doppler of the lower extremities that showed findings consistent with acute deep venous thrombosis involving the left femoral vein, left popliteal vein and left peroneal vein and no evidence of common femoral obstruction on the right.  She was referred to the DVT clinic and started treatment with Eliquis initially with the starter kit 10 mg p.o. twice daily for 7 days followed by 5 mg p.o. twice daily.  She was referred to [place] for evaluation and recommendation regarding her recently diagnosed left deep venous thrombosis. When seen today the patient is feeling fine with no concerning complaints except for mild cough.  She denied having any current bleeding or bruising.  She is tolerating her treatment with Eliquis fairly well.  She denied having any chest pain, shortness of breath or hemoptysis.  She has no nausea, vomiting, diarrhea or constipation.  She has no headache or visual changes. Family history significant for mother and father died from old age. The patient is a widow and has 3 children.  She is originally from [place] and she used to work at a group home.  She has no history for smoking, alcohol or drug abuse.  HPI  Past Medical History:  Diagnosis Date   Diabetes mellitus without complication (HCC)    borderline   Hypertension     No past surgical history on file.  No family history on file.  Social History Social  History   Tobacco Use   Smoking status: Never  Substance Use Topics   Alcohol use: No   Drug use: No    Allergies  Allergen Reactions   Lisinopril Cough    Current Outpatient Medications  Medication Sig Dispense Refill   apixaban (ELIQUIS) 5 MG TABS tablet Take 1 tablet (5 mg total) by mouth 2 (two) times daily. Start taking after completion of starter pack. 60 tablet 5   APIXABAN (ELIQUIS) VTE STARTER PACK (10MG  AND 5MG ) Take as directed on package: start with two-5mg  tablets twice daily for 7 days. On day 8, switch to one-5mg  tablet twice daily. 74 each 0   atorvastatin (LIPITOR) 40 MG tablet Take 40 mg by mouth daily.     Cholecalciferol (VITAMIN D-1000 MAX ST) 25 MCG (1000 UT) tablet Take 1,000 Units by mouth daily.     diclofenac Sodium (VOLTAREN) 1 % GEL Apply 2 g topically 4 (four) times daily.     Flaxseed, Linseed, (FLAXSEED OIL PO) Take 1 capsule by mouth daily.     losartan (COZAAR) 25 MG tablet Take 25 mg by mouth daily.     Multiple Vitamins-Minerals (CENTRUM SILVER PO) Take 1 tablet by mouth daily.     spironolactone-hydrochlorothiazide (ALDACTAZIDE) 25-25 MG tablet Take 1 tablet by mouth daily.     No current facility-administered medications for this visit.    Review of Systems  Constitutional: negative Eyes: negative Ears, nose, mouth, throat, and face: negative Respiratory: positive for cough Cardiovascular: negative Gastrointestinal: negative Genitourinary:negative Integument/breast: negative Hematologic/lymphatic: negative Musculoskeletal:negative Neurological: negative Behavioral/Psych:  negative Endocrine: negative Allergic/Immunologic: negative  Physical Exam  ZOX:WRUEA, healthy, no distress, well nourished, and well developed SKIN: skin color, texture, turgor are normal, no rashes or significant lesions HEAD: Normocephalic, No masses, lesions, tenderness or abnormalities EYES: normal, PERRLA, Conjunctiva are pink and non-injected EARS:  External ears normal, Canals clear OROPHARYNX:no exudate, no erythema, and lips, buccal mucosa, and tongue normal  NECK: supple, no adenopathy, no JVD LYMPH:  no palpable lymphadenopathy, no hepatosplenomegaly BREAST:not examined LUNGS: clear to auscultation , and palpation HEART: regular rate & rhythm, no murmurs, and no gallops ABDOMEN:abdomen soft, non-tender, normal bowel sounds, and no masses or organomegaly BACK: Back symmetric, no curvature., No CVA tenderness EXTREMITIES:no joint deformities, effusion, or inflammation, no edema  NEURO: alert & oriented x 3 with fluent speech, no focal motor/sensory deficits  PERFORMANCE STATUS: ECOG 1  LABORATORY DATA: Lab Results  Component Value Date   WBC 8.5 06/08/2022   HGB 10.7 (L) 06/08/2022   HCT 35.7 (L) 06/08/2022   MCV 71.4 (L) 06/08/2022   PLT 228 06/08/2022      Chemistry      Component Value Date/Time   NA 131 (L) 06/08/2022 1200   K 4.1 06/08/2022 1200   CL 100 06/08/2022 1200   CO2 23 06/08/2022 1200   BUN 41 (H) 06/08/2022 1200   CREATININE 1.37 (H) 06/08/2022 1200      Component Value Date/Time   CALCIUM 9.6 06/08/2022 1200   ALKPHOS 79 06/08/2022 1200   AST 22 06/08/2022 1200   ALT 23 06/08/2022 1200   BILITOT 0.6 06/08/2022 1200       RADIOGRAPHIC STUDIES: No results found.  ASSESSMENT: This is a very pleasant 77 years old African female who originally from [place] diagnosed with left lower extremity deep venous thrombosis of unprovoked etiology except for her sedentary life secondary to old age and decreased immobility.  The patient has no previous personal or family history of deep venous thrombosis She is currently on treatment with Eliquis and tolerating it fairly well.  PLAN: I had a lengthy discussion with the patient today about her current condition and treatment options. I recommended for the patient to continue her current treatment with Eliquis for at least 6 months. I explained to the patient  that doing hypercoagulable panel at this point may not be valuable since she is already on anticoagulation with Eliquis and I would consider ordering the hypercoagulable panel at least 3-4 weeks after completion of her treatment with Eliquis in 6 months.  I will see the patient back for follow-up visit 1-2 weeks after the lab work. For the microcytic anemia, I recommended for the patient to start taking over-the-counter oral iron tablet with vitamin C or orange juice once daily. She will come back for follow-up visit in around 7 months and she was advised to call sooner if she has any concerning issues in the interval.  The patient voices understanding of current disease status and treatment options and is in agreement with the current care plan.  All questions were answered. The patient knows to call the clinic with any problems, questions or concerns. We can certainly see the patient much sooner if necessary.  Thank you so much for allowing me to participate in the care of Tina Bryant. I will continue to follow up the patient with you and assist in her care.  The total time spent in the appointment was 60 minutes.  Disclaimer: This note was dictated with voice recognition software. Similar sounding words  can inadvertently be transcribed and may not be corrected upon review.   Lajuana Matte July 18, 2022, 11:42 AM

## 2022-07-25 ENCOUNTER — Ambulatory Visit (HOSPITAL_COMMUNITY): Payer: Medicaid Other

## 2022-10-05 ENCOUNTER — Other Ambulatory Visit: Payer: Self-pay | Admitting: Orthopedic Surgery

## 2022-10-11 NOTE — Patient Instructions (Addendum)
SURGICAL WAITING ROOM VISITATION  Patients having surgery or a procedure may have no more than 2 support people in the waiting area - these visitors may rotate.    Children under the age of 66 must have an adult with them who is not the patient.  Due to an increase in RSV and influenza rates and associated hospitalizations, children ages 56 and under may not visit patients in [facility] hospitals.  If the patient needs to stay at the hospital during part of their recovery, the visitor guidelines for inpatient rooms apply. Pre-op nurse will coordinate an appropriate time for 1 support person to accompany patient in pre-op.  This support person may not rotate.    Please refer to the [facility] website for the visitor guidelines for Inpatients (after your surgery is over and you are in a regular room).    Your procedure is scheduled on: 10/22/22   Report to [facility] Main Entrance    Report to admitting at 7:30 AM   Call this number if you have problems the morning of surgery [phone]   Do not eat food :After Midnight.   After Midnight you may have the following liquids until 7:00 AM DAY OF SURGERY  Water Non-Citrus Juices (without pulp, NO RED-Apple, White grape, White cranberry) Black Coffee (NO MILK/CREAM OR CREAMERS, sugar ok)  Clear Tea (NO MILK/CREAM OR CREAMERS, sugar ok) regular and decaf                             Plain Jell-O (NO RED)                                           Fruit ices (not with fruit pulp, NO RED)                                     Popsicles (NO RED)                                                               Sports drinks like Gatorade (NO RED)    The day of surgery:  Drink ONE (1) Pre-Surgery G2 at 7:00 AM the morning of surgery. Drink in one sitting. Do not sip.  This drink was given to you during your hospital  pre-op appointment visit. Nothing else to drink after completing the  Pre-Surgery G2.          If you have questions,  please contact your surgeon's office.   FOLLOW BOWEL PREP AND ANY ADDITIONAL PRE OP INSTRUCTIONS YOU RECEIVED FROM YOUR SURGEON'S OFFICE!!!     Oral Hygiene is also important to reduce your risk of infection.                                    Remember - BRUSH YOUR TEETH THE MORNING OF SURGERY WITH YOUR REGULAR TOOTHPASTE  DENTURES WILL BE REMOVED PRIOR TO SURGERY PLEASE DO NOT APPLY "Poly grip" OR ADHESIVES!!!  Stop all vitamins and herbal supplements 7 days before surgery.   Take these medicines the morning of surgery with A SIP OF WATER: Atorvastatin   These are anesthesia recommendations for holding your anticoagulants.  Please contact your prescribing physician to confirm IF it is safe to hold your anticoagulants for this length of time.   Eliquis Apixaban   72 hours   Xarelto Rivaroxaban   72 hours  Plavix Clopidogrel   120 hours  Pletal Cilostazol   120 hours   How to Manage Your Diabetes Before and After Surgery  Why is it important to control my blood sugar before and after surgery? Improving blood sugar levels before and after surgery helps healing and can limit problems. A way of improving blood sugar control is eating a healthy diet by:  Eating less sugar and carbohydrates  Increasing activity/exercise  Talking with your doctor about reaching your blood sugar goals High blood sugars (greater than 180 mg/dL) can raise your risk of infections and slow your recovery, so you will need to focus on controlling your diabetes during the weeks before surgery. Make sure that the doctor who takes care of your diabetes knows about your planned surgery including the date and location.  How do I manage my blood sugar before surgery? Check your blood sugar at least 4 times a day, starting 2 days before surgery, to make sure that the level is not too high or low. Check your blood sugar the morning of your surgery when you wake up and every 2 hours until you get to the Short Stay  unit. If your blood sugar is less than 70 mg/dL, you will need to treat for low blood sugar: Do not take insulin. Treat a low blood sugar (less than 70 mg/dL) with  cup of clear juice (cranberry or apple), 4 glucose tablets, OR glucose gel. Recheck blood sugar in 15 minutes after treatment (to make sure it is greater than 70 mg/dL). If your blood sugar is not greater than 70 mg/dL on recheck, call [phone] for further instructions. Report your blood sugar to the short stay nurse when you get to Short Stay.  If you are admitted to the hospital after surgery: Your blood sugar will be checked by the staff and you will probably be given insulin after surgery (instead of oral diabetes medicines) to make sure you have good blood sugar levels. The goal for blood sugar control after surgery is 80-180 mg/dL.  Reviewed and Endorsed by [facility] Patient Education Committee, August 2015                              You may not have any metal on your body including hair pins, jewelry, and body piercing             Do not wear make-up, lotions, powders, perfumes, or deodorant  Do not wear nail polish including gel and S&S, artificial/acrylic nails, or any other type of covering on natural nails including finger and toenails. If you have artificial nails, gel coating, etc. that needs to be removed by a nail salon please have this removed prior to surgery or surgery may need to be canceled/ delayed if the surgeon/ anesthesia feels like they are unable to be safely monitored.   Do not shave  48 hours prior to surgery.    Do not bring valuables to the hospital. [place] IS NOT  RESPONSIBLE   FOR VALUABLES.   Contacts, glasses, dentures or bridgework may not be worn into surgery.   Bring small overnight bag day of surgery.   DO NOT BRING YOUR HOME MEDICATIONS TO THE HOSPITAL. PHARMACY WILL DISPENSE MEDICATIONS LISTED ON YOUR MEDICATION LIST TO YOU DURING YOUR ADMISSION IN THE  HOSPITAL!              Please read over the following fact sheets you were given: IF YOU HAVE QUESTIONS ABOUT YOUR PRE-OP INSTRUCTIONS PLEASE CALL [phone]    If you received a COVID test during your pre-op visit  it is requested that you wear a mask when out in public, stay away from anyone that may not be feeling well and notify your surgeon if you develop symptoms. If you test positive for Covid or have been in contact with anyone that has tested positive in the last 10 days please notify you surgeon.      Pre-operative 5 CHG Bath Instructions   You can play a key role in reducing the risk of infection after surgery. Your skin needs to be as free of germs as possible. You can reduce the number of germs on your skin by washing with CHG (chlorhexidine gluconate) soap before surgery. CHG is an antiseptic soap that kills germs and continues to kill germs even after washing.   DO NOT use if you have an allergy to chlorhexidine/CHG or antibacterial soaps. If your skin becomes reddened or irritated, stop using the CHG and notify one of our RNs at [phone].   Please shower with the CHG soap starting 4 days before surgery using the following schedule:     Please keep in mind the following:  DO NOT shave, including legs and underarms, starting the day of your first shower.   You may shave your face at any point before/day of surgery.  Place clean sheets on your bed the day you start using CHG soap. Use a clean washcloth (not used since being washed) for each shower. DO NOT sleep with pets once you start using the CHG.   CHG Shower Instructions:  If you choose to wash your hair and private area, wash first with your normal shampoo/soap.  After you use shampoo/soap, rinse your hair and body thoroughly to remove shampoo/soap residue.  Turn the water OFF and apply about 3 tablespoons (45 ml) of CHG soap to a CLEAN washcloth.  Apply CHG soap ONLY FROM YOUR NECK DOWN TO YOUR TOES  (washing for 3-5 minutes)  DO NOT use CHG soap on face, private areas, open wounds, or sores.  Pay special attention to the area where your surgery is being performed.  If you are having back surgery, having someone wash your back for you may be helpful. Wait 2 minutes after CHG soap is applied, then you may rinse off the CHG soap.  Pat dry with a clean towel  Put on clean clothes/pajamas   If you choose to wear lotion, please use ONLY the CHG-compatible lotions on the back of this paper.     Additional instructions for the day of surgery: DO NOT APPLY any lotions, deodorants, cologne, or perfumes.   Put on clean/comfortable clothes.  Brush your teeth.  Ask your nurse before applying any prescription medications to the skin.      CHG Compatible Lotions   Aveeno Moisturizing lotion  Cetaphil Moisturizing Cream  Cetaphil Moisturizing Lotion  Clairol Herbal Essence Moisturizing Lotion, Dry Skin  Clairol Herbal Essence Moisturizing  Lotion, Extra Dry Skin  Clairol Herbal Essence Moisturizing Lotion, Normal Skin  Curel Age Defying Therapeutic Moisturizing Lotion with Alpha Hydroxy  Curel Extreme Care Body Lotion  Curel Soothing Hands Moisturizing Hand Lotion  Curel Therapeutic Moisturizing Cream, Fragrance-Free  Curel Therapeutic Moisturizing Lotion, Fragrance-Free  Curel Therapeutic Moisturizing Lotion, Original Formula  Eucerin Daily Replenishing Lotion  Eucerin Dry Skin Therapy Plus Alpha Hydroxy Crme  Eucerin Dry Skin Therapy Plus Alpha Hydroxy Lotion  Eucerin Original Crme  Eucerin Original Lotion  Eucerin Plus Crme Eucerin Plus Lotion  Eucerin TriLipid Replenishing Lotion  Keri Anti-Bacterial Hand Lotion  Keri Deep Conditioning Original Lotion Dry Skin Formula Softly Scented  Keri Deep Conditioning Original Lotion, Fragrance Free Sensitive Skin Formula  Keri Lotion Fast Absorbing Fragrance Free Sensitive Skin Formula  Keri Lotion Fast Absorbing Softly Scented Dry Skin  Formula  Keri Original Lotion  Keri Skin Renewal Lotion Keri Silky Smooth Lotion  Keri Silky Smooth Sensitive Skin Lotion  Nivea Body Creamy Conditioning Oil  Nivea Body Extra Enriched Lotion  Nivea Body Original Lotion  Nivea Body Sheer Moisturizing Lotion Nivea Crme  Nivea Skin Firming Lotion  NutraDerm 30 Skin Lotion  NutraDerm Skin Lotion  NutraDerm Therapeutic Skin Cream  NutraDerm Therapeutic Skin Lotion  ProShield Protective Hand Cream  Provon moisturizing lotion   Incentive Spirometer  An incentive spirometer is a tool that can help keep your lungs clear and active. This tool measures how well you are filling your lungs with each breath. Taking long deep breaths may help reverse or decrease the chance of developing breathing (pulmonary) problems (especially infection) following: A long period of time when you are unable to move or be active. BEFORE THE PROCEDURE  If the spirometer includes an indicator to show your best effort, your nurse or respiratory therapist will set it to a desired goal. If possible, sit up straight or lean slightly forward. Try not to slouch. Hold the incentive spirometer in an upright position. INSTRUCTIONS FOR USE  Sit on the edge of your bed if possible, or sit up as far as you can in bed or on a chair. Hold the incentive spirometer in an upright position. Breathe out normally. Place the mouthpiece in your mouth and seal your lips tightly around it. Breathe in slowly and as deeply as possible, raising the piston or the ball toward the top of the column. Hold your breath for 3-5 seconds or for as long as possible. Allow the piston or ball to fall to the bottom of the column. Remove the mouthpiece from your mouth and breathe out normally. Rest for a few seconds and repeat Steps 1 through 7 at least 10 times every 1-2 hours when you are awake. Take your time and take a few normal breaths between deep breaths. The spirometer may include an indicator to  show your best effort. Use the indicator as a goal to work toward during each repetition. After each set of 10 deep breaths, practice coughing to be sure your lungs are clear. If you have an incision (the cut made at the time of surgery), support your incision when coughing by placing a pillow or rolled up towels firmly against it. Once you are able to get out of bed, walk around indoors and cough well. You may stop using the incentive spirometer when instructed by your caregiver.  RISKS AND COMPLICATIONS Take your time so you do not get dizzy or light-headed. If you are in pain, you may need to take or ask  for pain medication before doing incentive spirometry. It is harder to take a deep breath if you are having pain. AFTER USE Rest and breathe slowly and easily. It can be helpful to keep track of a log of your progress. Your caregiver can provide you with a simple table to help with this. If you are using the spirometer at home, follow these instructions: SEEK MEDICAL CARE IF:  You are having difficultly using the spirometer. You have trouble using the spirometer as often as instructed. Your pain medication is not giving enough relief while using the spirometer. You develop fever of 100.5 F (38.1 C) or higher. SEEK IMMEDIATE MEDICAL CARE IF:  You cough up bloody sputum that had not been present before. You develop fever of 102 F (38.9 C) or greater. You develop worsening pain at or near the incision site. MAKE SURE YOU:  Understand these instructions. Will watch your condition. Will get help right away if you are not doing well or get worse. Document Released: 05/07/2006 Document Revised: 03/19/2011 Document Reviewed: 07/08/2006 [facility] Patient Information 2014 [place], [place].   ________________________________________________________________________

## 2022-10-11 NOTE — Progress Notes (Addendum)
COVID Vaccine Completed: yes  Date of COVID positive in last 90 days: no  PCP - Newton Pigg, FNP Cardiologist - n/a  Chest x-ray - 10/12/22 Epic EKG - 10/12/22 Epic/chart Stress Test - 09/12/19 Epic ECHO - n/a Cardiac Cath - n/a Pacemaker/ICD device last checked: n/a Spinal Cord Stimulator: n/a  Bowel Prep - no  Sleep Study - n/a CPAP -   Fasting Blood Sugar - no  Checks Blood Sugar _____ times a day  Last dose of GLP1 agonist-  N/A GLP1 instructions:  N/A   Last dose of SGLT-2 inhibitors-  N/A SGLT-2 instructions: N/A   Blood Thinner Instructions:  Eliquis, no instructions per pt. Gave ger our recommendations and instructed to call her doctor and make sure they are okay with her holding Aspirin Instructions: Last Dose:  Activity level: Can go up a flight of stairs and perform activities of daily living without stopping and without symptoms of chest pain or shortness of breath. Slow with stairs due to knee pain.  Anesthesia review: DVT, HTN, anemia, CKD, DM  Patient denies shortness of breath, fever, cough and chest pain at PAT appointment  Patient verbalized understanding of instructions that were given to them at the PAT appointment. Patient was also instructed that they will need to review over the PAT instructions again at home before surgery.

## 2022-10-12 ENCOUNTER — Encounter (HOSPITAL_COMMUNITY)
Admission: RE | Admit: 2022-10-12 | Discharge: 2022-10-12 | Disposition: A | Discharge status: Home / Self Care | Payer: Medicaid Other | Source: Ambulatory Visit | Attending: Orthopedic Surgery

## 2022-10-12 ENCOUNTER — Other Ambulatory Visit: Payer: Self-pay

## 2022-10-12 ENCOUNTER — Encounter (HOSPITAL_COMMUNITY): Payer: Self-pay

## 2022-10-12 ENCOUNTER — Ambulatory Visit (HOSPITAL_COMMUNITY)
Admission: RE | Admit: 2022-10-12 | Discharge: 2022-10-12 | Disposition: A | Discharge status: Home / Self Care | Payer: Medicaid Other | Source: Ambulatory Visit | Attending: Orthopedic Surgery | Admitting: Orthopedic Surgery

## 2022-10-12 VITALS — BP 151/77 | HR 72 | Temp 98.2°F | Resp 16 | Ht 66.0 in | Wt 188.3 lb

## 2022-10-12 DIAGNOSIS — I10 Essential (primary) hypertension: Secondary | ICD-10-CM | POA: Diagnosis not present

## 2022-10-12 DIAGNOSIS — E119 Type 2 diabetes mellitus without complications: Secondary | ICD-10-CM | POA: Diagnosis not present

## 2022-10-12 DIAGNOSIS — Z01818 Encounter for other preprocedural examination: Secondary | ICD-10-CM | POA: Insufficient documentation

## 2022-10-12 HISTORY — DX: Inflammatory liver disease, unspecified: K75.9

## 2022-10-12 HISTORY — DX: Chronic kidney disease, unspecified: N18.9

## 2022-10-12 HISTORY — DX: Acute embolism and thrombosis of unspecified deep veins of unspecified lower extremity: I82.409

## 2022-10-12 LAB — CBC
HCT: 34.6 % — ABNORMAL LOW (ref 36.0–46.0)
Hemoglobin: 10.1 g/dL — ABNORMAL LOW (ref 12.0–15.0)
MCH: 21.4 pg — ABNORMAL LOW (ref 26.0–34.0)
MCHC: 29.2 g/dL — ABNORMAL LOW (ref 30.0–36.0)
MCV: 73.3 fL — ABNORMAL LOW (ref 80.0–100.0)
Platelets: 205 10*3/uL (ref 150–400)
RBC: 4.72 MIL/uL (ref 3.87–5.11)
RDW: 16.8 % — ABNORMAL HIGH (ref 11.5–15.5)
WBC: 5.3 10*3/uL (ref 4.0–10.5)
nRBC: 0 % (ref 0.0–0.2)

## 2022-10-12 LAB — HEMOGLOBIN A1C
Hgb A1c MFr Bld: 6.5 % — ABNORMAL HIGH (ref 4.8–5.6)
Mean Plasma Glucose: 139.85 mg/dL

## 2022-10-12 LAB — BASIC METABOLIC PANEL
Anion gap: 9 (ref 5–15)
BUN: 19 mg/dL (ref 8–23)
CO2: 24 mmol/L (ref 22–32)
Calcium: 9.3 mg/dL (ref 8.9–10.3)
Chloride: 104 mmol/L (ref 98–111)
Creatinine, Ser: 0.97 mg/dL (ref 0.44–1.00)
GFR, Estimated: 60 mL/min — ABNORMAL LOW (ref >60)
Glucose, Bld: 88 mg/dL (ref 70–99)
Potassium: 4.5 mmol/L (ref 3.5–5.1)
Sodium: 137 mmol/L (ref 135–145)

## 2022-10-12 LAB — GLUCOSE, CAPILLARY: Glucose-Capillary: 83 mg/dL (ref 70–99)

## 2022-10-12 LAB — SURGICAL PCR SCREEN
MRSA, PCR: NEGATIVE
Staphylococcus aureus: NEGATIVE

## 2022-10-16 ENCOUNTER — Encounter (HOSPITAL_COMMUNITY): Payer: Self-pay | Admitting: Physician Assistant

## 2022-10-18 ENCOUNTER — Other Ambulatory Visit: Payer: Self-pay

## 2022-10-22 ENCOUNTER — Ambulatory Visit (HOSPITAL_COMMUNITY): Admission: RE | Admit: 2022-10-22 | Payer: Medicaid Other | Source: Ambulatory Visit | Admitting: Orthopedic Surgery

## 2022-10-22 ENCOUNTER — Encounter (HOSPITAL_COMMUNITY): Admission: RE | Payer: Self-pay | Source: Ambulatory Visit

## 2022-10-22 SURGERY — TOTAL KNEE ARTHROPLASTY
Anesthesia: Spinal | Site: Knee | Laterality: Left

## 2022-11-01 ENCOUNTER — Other Ambulatory Visit: Payer: Self-pay

## 2022-11-01 ENCOUNTER — Emergency Department (HOSPITAL_COMMUNITY): Payer: Medicaid Other

## 2022-11-01 ENCOUNTER — Encounter (HOSPITAL_COMMUNITY): Payer: Self-pay

## 2022-11-01 ENCOUNTER — Inpatient Hospital Stay (HOSPITAL_COMMUNITY)
Admission: EM | Admit: 2022-11-01 | Discharge: 2022-11-06 | DRG: 871 | Disposition: A | Discharge status: Home / Self Care | Payer: Medicaid Other | Attending: Family Medicine | Admitting: Family Medicine

## 2022-11-01 DIAGNOSIS — E1122 Type 2 diabetes mellitus with diabetic chronic kidney disease: Secondary | ICD-10-CM | POA: Diagnosis present

## 2022-11-01 DIAGNOSIS — Z888 Allergy status to other drugs, medicaments and biological substances status: Secondary | ICD-10-CM

## 2022-11-01 DIAGNOSIS — D3502 Benign neoplasm of left adrenal gland: Secondary | ICD-10-CM | POA: Diagnosis present

## 2022-11-01 DIAGNOSIS — I129 Hypertensive chronic kidney disease with stage 1 through stage 4 chronic kidney disease, or unspecified chronic kidney disease: Secondary | ICD-10-CM | POA: Diagnosis present

## 2022-11-01 DIAGNOSIS — A4151 Sepsis due to Escherichia coli [E. coli]: Principal | ICD-10-CM | POA: Diagnosis present

## 2022-11-01 DIAGNOSIS — D509 Iron deficiency anemia, unspecified: Secondary | ICD-10-CM | POA: Diagnosis present

## 2022-11-01 DIAGNOSIS — N1831 Chronic kidney disease, stage 3a: Secondary | ICD-10-CM

## 2022-11-01 DIAGNOSIS — M25561 Pain in right knee: Secondary | ICD-10-CM | POA: Diagnosis present

## 2022-11-01 DIAGNOSIS — M25562 Pain in left knee: Secondary | ICD-10-CM | POA: Diagnosis present

## 2022-11-01 DIAGNOSIS — R7989 Other specified abnormal findings of blood chemistry: Secondary | ICD-10-CM | POA: Insufficient documentation

## 2022-11-01 DIAGNOSIS — M549 Dorsalgia, unspecified: Secondary | ICD-10-CM | POA: Diagnosis present

## 2022-11-01 DIAGNOSIS — I2489 Other forms of acute ischemic heart disease: Secondary | ICD-10-CM | POA: Diagnosis present

## 2022-11-01 DIAGNOSIS — J189 Pneumonia, unspecified organism: Secondary | ICD-10-CM | POA: Diagnosis present

## 2022-11-01 DIAGNOSIS — G9341 Metabolic encephalopathy: Secondary | ICD-10-CM

## 2022-11-01 DIAGNOSIS — E872 Acidosis, unspecified: Secondary | ICD-10-CM | POA: Diagnosis present

## 2022-11-01 DIAGNOSIS — E8729 Other acidosis: Secondary | ICD-10-CM | POA: Insufficient documentation

## 2022-11-01 DIAGNOSIS — A419 Sepsis, unspecified organism: Secondary | ICD-10-CM | POA: Insufficient documentation

## 2022-11-01 DIAGNOSIS — Z86718 Personal history of other venous thrombosis and embolism: Secondary | ICD-10-CM | POA: Diagnosis not present

## 2022-11-01 DIAGNOSIS — R6520 Severe sepsis without septic shock: Secondary | ICD-10-CM | POA: Diagnosis present

## 2022-11-01 DIAGNOSIS — Z1612 Extended spectrum beta lactamase (ESBL) resistance: Secondary | ICD-10-CM | POA: Diagnosis present

## 2022-11-01 DIAGNOSIS — I10 Essential (primary) hypertension: Secondary | ICD-10-CM | POA: Diagnosis not present

## 2022-11-01 DIAGNOSIS — N179 Acute kidney failure, unspecified: Secondary | ICD-10-CM | POA: Insufficient documentation

## 2022-11-01 DIAGNOSIS — R7401 Elevation of levels of liver transaminase levels: Secondary | ICD-10-CM | POA: Diagnosis present

## 2022-11-01 DIAGNOSIS — R4182 Altered mental status, unspecified: Secondary | ICD-10-CM

## 2022-11-01 DIAGNOSIS — E7849 Other hyperlipidemia: Secondary | ICD-10-CM

## 2022-11-01 DIAGNOSIS — K5730 Diverticulosis of large intestine without perforation or abscess without bleeding: Secondary | ICD-10-CM | POA: Diagnosis present

## 2022-11-01 DIAGNOSIS — I82409 Acute embolism and thrombosis of unspecified deep veins of unspecified lower extremity: Secondary | ICD-10-CM | POA: Diagnosis not present

## 2022-11-01 DIAGNOSIS — N1832 Chronic kidney disease, stage 3b: Secondary | ICD-10-CM | POA: Diagnosis present

## 2022-11-01 DIAGNOSIS — Y92009 Unspecified place in unspecified non-institutional (private) residence as the place of occurrence of the external cause: Secondary | ICD-10-CM

## 2022-11-01 DIAGNOSIS — E119 Type 2 diabetes mellitus without complications: Secondary | ICD-10-CM

## 2022-11-01 DIAGNOSIS — Z7901 Long term (current) use of anticoagulants: Secondary | ICD-10-CM

## 2022-11-01 DIAGNOSIS — Z87898 Personal history of other specified conditions: Secondary | ICD-10-CM

## 2022-11-01 DIAGNOSIS — D631 Anemia in chronic kidney disease: Secondary | ICD-10-CM | POA: Diagnosis present

## 2022-11-01 DIAGNOSIS — R55 Syncope and collapse: Secondary | ICD-10-CM | POA: Diagnosis not present

## 2022-11-01 DIAGNOSIS — N183 Chronic kidney disease, stage 3 unspecified: Secondary | ICD-10-CM

## 2022-11-01 DIAGNOSIS — G8929 Other chronic pain: Secondary | ICD-10-CM | POA: Diagnosis present

## 2022-11-01 DIAGNOSIS — E871 Hypo-osmolality and hyponatremia: Secondary | ICD-10-CM | POA: Diagnosis present

## 2022-11-01 DIAGNOSIS — W19XXXA Unspecified fall, initial encounter: Secondary | ICD-10-CM | POA: Diagnosis present

## 2022-11-01 DIAGNOSIS — E785 Hyperlipidemia, unspecified: Secondary | ICD-10-CM | POA: Diagnosis present

## 2022-11-01 DIAGNOSIS — Z79899 Other long term (current) drug therapy: Secondary | ICD-10-CM

## 2022-11-01 LAB — COMPREHENSIVE METABOLIC PANEL
ALT: 74 U/L — ABNORMAL HIGH (ref 0–44)
AST: 61 U/L — ABNORMAL HIGH (ref 15–41)
Albumin: 2.9 g/dL — ABNORMAL LOW (ref 3.5–5.0)
Alkaline Phosphatase: 64 U/L (ref 38–126)
Anion gap: 18 — ABNORMAL HIGH (ref 5–15)
BUN: 39 mg/dL — ABNORMAL HIGH (ref 8–23)
CO2: 17 mmol/L — ABNORMAL LOW (ref 22–32)
Calcium: 9.2 mg/dL (ref 8.9–10.3)
Chloride: 97 mmol/L — ABNORMAL LOW (ref 98–111)
Creatinine, Ser: 2.35 mg/dL — ABNORMAL HIGH (ref 0.44–1.00)
GFR, Estimated: 21 mL/min — ABNORMAL LOW (ref >60)
Glucose, Bld: 158 mg/dL — ABNORMAL HIGH (ref 70–99)
Potassium: 4 mmol/L (ref 3.5–5.1)
Sodium: 132 mmol/L — ABNORMAL LOW (ref 135–145)
Total Bilirubin: 1.3 mg/dL — ABNORMAL HIGH (ref 0.3–1.2)
Total Protein: 7 g/dL (ref 6.5–8.1)

## 2022-11-01 LAB — I-STAT CHEM 8, ED
BUN: 36 mg/dL — ABNORMAL HIGH (ref 8–23)
Calcium, Ion: 1.09 mmol/L — ABNORMAL LOW (ref 1.15–1.40)
Chloride: 102 mmol/L (ref 98–111)
Creatinine, Ser: 2.4 mg/dL — ABNORMAL HIGH (ref 0.44–1.00)
Glucose, Bld: 161 mg/dL — ABNORMAL HIGH (ref 70–99)
HCT: 31 % — ABNORMAL LOW (ref 36.0–46.0)
Hemoglobin: 10.5 g/dL — ABNORMAL LOW (ref 12.0–15.0)
Potassium: 4.1 mmol/L (ref 3.5–5.1)
Sodium: 133 mmol/L — ABNORMAL LOW (ref 135–145)
TCO2: 19 mmol/L — ABNORMAL LOW (ref 22–32)

## 2022-11-01 LAB — CBC WITH DIFFERENTIAL/PLATELET
Abs Immature Granulocytes: 0.45 10*3/uL — ABNORMAL HIGH (ref 0.00–0.07)
Basophils Absolute: 0.1 10*3/uL (ref 0.0–0.1)
Basophils Relative: 0 %
Eosinophils Absolute: 0 10*3/uL (ref 0.0–0.5)
Eosinophils Relative: 0 %
HCT: 28.7 % — ABNORMAL LOW (ref 36.0–46.0)
Hemoglobin: 9 g/dL — ABNORMAL LOW (ref 12.0–15.0)
Immature Granulocytes: 2 %
Lymphocytes Relative: 2 %
Lymphs Abs: 0.6 10*3/uL — ABNORMAL LOW (ref 0.7–4.0)
MCH: 20.8 pg — ABNORMAL LOW (ref 26.0–34.0)
MCHC: 31.4 g/dL (ref 30.0–36.0)
MCV: 66.3 fL — ABNORMAL LOW (ref 80.0–100.0)
Monocytes Absolute: 1.3 10*3/uL — ABNORMAL HIGH (ref 0.1–1.0)
Monocytes Relative: 5 %
Neutro Abs: 24.4 10*3/uL — ABNORMAL HIGH (ref 1.7–7.7)
Neutrophils Relative %: 91 %
Platelets: 276 10*3/uL (ref 150–400)
RBC: 4.33 MIL/uL (ref 3.87–5.11)
RDW: 16.7 % — ABNORMAL HIGH (ref 11.5–15.5)
WBC: 26.8 10*3/uL — ABNORMAL HIGH (ref 4.0–10.5)
nRBC: 0 % (ref 0.0–0.2)

## 2022-11-01 LAB — TROPONIN I (HIGH SENSITIVITY): Troponin I (High Sensitivity): 51 ng/L — ABNORMAL HIGH (ref <18)

## 2022-11-01 LAB — PROTIME-INR
INR: 2 — ABNORMAL HIGH (ref 0.8–1.2)
Prothrombin Time: 23.1 s — ABNORMAL HIGH (ref 11.4–15.2)

## 2022-11-01 LAB — IRON AND TIBC
Iron: 17 ug/dL — ABNORMAL LOW (ref 28–170)
Saturation Ratios: 7 % — ABNORMAL LOW (ref 10.4–31.8)
TIBC: 246 ug/dL — ABNORMAL LOW (ref 250–450)
UIBC: 229 ug/dL

## 2022-11-01 LAB — CK: Total CK: 221 U/L (ref 38–234)

## 2022-11-01 LAB — RETICULOCYTES
Immature Retic Fract: 11.3 % (ref 2.3–15.9)
RBC.: 4.31 MIL/uL (ref 3.87–5.11)
Retic Count, Absolute: 48.3 10*3/uL (ref 19.0–186.0)
Retic Ct Pct: 1.1 % (ref 0.4–3.1)

## 2022-11-01 LAB — I-STAT CG4 LACTIC ACID, ED: Lactic Acid, Venous: 2.4 mmol/L (ref 0.5–1.9)

## 2022-11-01 LAB — FERRITIN: Ferritin: 451 ng/mL — ABNORMAL HIGH (ref 11–307)

## 2022-11-01 LAB — LIPASE, BLOOD: Lipase: 43 U/L (ref 11–51)

## 2022-11-01 MED ORDER — SODIUM CHLORIDE 0.9 % IV SOLN
100.0000 mg | Freq: Two times a day (BID) | INTRAVENOUS | Status: DC
  Administered 2022-11-02 – 2022-11-03 (×2): 100 mg via INTRAVENOUS
  Filled 2022-11-01 (×3): qty 100

## 2022-11-01 MED ORDER — SODIUM CHLORIDE 0.9 % IV SOLN
2.0000 g | INTRAVENOUS | Status: DC
  Administered 2022-11-02: 2 g via INTRAVENOUS
  Filled 2022-11-01: qty 20

## 2022-11-01 MED ORDER — SENNOSIDES-DOCUSATE SODIUM 8.6-50 MG PO TABS
1.0000 | ORAL_TABLET | Freq: Every evening | ORAL | Status: DC | PRN
Start: 2022-11-01 — End: 2022-11-06

## 2022-11-01 MED ORDER — INSULIN ASPART 100 UNIT/ML IJ SOLN: 0.0000 [IU] | Freq: Three times a day (TID) | INTRAMUSCULAR | Status: DC

## 2022-11-01 MED ORDER — SODIUM CHLORIDE 0.9 % IV SOLN: 2.0000 g | INTRAVENOUS | Status: DC

## 2022-11-01 MED ORDER — APIXABAN 5 MG PO TABS
5.0000 mg | ORAL_TABLET | Freq: Two times a day (BID) | ORAL | Status: DC
  Administered 2022-11-02 – 2022-11-06 (×10): 5 mg via ORAL
  Filled 2022-11-01 (×10): qty 1

## 2022-11-01 MED ORDER — SODIUM CHLORIDE 0.9% FLUSH: 3.0000 mL | INTRAVENOUS | Status: DC | PRN

## 2022-11-01 MED ORDER — ACETAMINOPHEN 325 MG PO TABS: 650.0000 mg | ORAL_TABLET | Freq: Four times a day (QID) | ORAL | Status: DC | PRN

## 2022-11-01 MED ORDER — ENSURE ENLIVE PO LIQD
237.0000 mL | Freq: Three times a day (TID) | ORAL | Status: DC
  Administered 2022-11-02 – 2022-11-03 (×5): 237 mL via ORAL
  Filled 2022-11-01: qty 237

## 2022-11-01 MED ORDER — LACTATED RINGERS IV BOLUS
1000.0000 mL | Freq: Once | INTRAVENOUS | Status: AC
Start: 2022-11-01 — End: 2022-11-01
  Administered 2022-11-01: 1000 mL via INTRAVENOUS

## 2022-11-01 MED ORDER — SODIUM CHLORIDE 0.9 % IV SOLN
500.0000 mg | INTRAVENOUS | Status: DC
  Administered 2022-11-01: 500 mg via INTRAVENOUS
  Filled 2022-11-01: qty 5

## 2022-11-01 MED ORDER — SODIUM CHLORIDE 0.9 % IV SOLN: 250.0000 mL | INTRAVENOUS | Status: AC | PRN

## 2022-11-01 MED ORDER — FERROUS SULFATE 325 (65 FE) MG PO TABS
325.0000 mg | ORAL_TABLET | Freq: Every day | ORAL | Status: DC
  Administered 2022-11-02 – 2022-11-06 (×5): 325 mg via ORAL
  Filled 2022-11-01 (×6): qty 1

## 2022-11-01 MED ORDER — INSULIN ASPART 100 UNIT/ML IJ SOLN: 0.0000 [IU] | Freq: Every day | INTRAMUSCULAR | Status: DC

## 2022-11-01 MED ORDER — ONDANSETRON HCL 4 MG/2ML IJ SOLN: 4.0000 mg | Freq: Four times a day (QID) | INTRAMUSCULAR | Status: DC | PRN

## 2022-11-01 MED ORDER — SODIUM CHLORIDE 0.9 % IV BOLUS: 1000.0000 mL | Freq: Once | INTRAVENOUS | Status: DC

## 2022-11-01 MED ORDER — ONDANSETRON HCL 4 MG PO TABS
4.0000 mg | ORAL_TABLET | Freq: Four times a day (QID) | ORAL | Status: DC | PRN
Start: 2022-11-01 — End: 2022-11-06

## 2022-11-01 MED ORDER — LACTATED RINGERS IV SOLN
INTRAVENOUS | Status: AC
  Administered 2022-11-02: 1000 mL via INTRAVENOUS

## 2022-11-01 MED ORDER — ACETAMINOPHEN 650 MG RE SUPP: 650.0000 mg | Freq: Four times a day (QID) | RECTAL | Status: DC | PRN

## 2022-11-01 MED ORDER — SODIUM CHLORIDE 0.9% FLUSH
3.0000 mL | Freq: Two times a day (BID) | INTRAVENOUS | Status: DC
Start: 2022-11-01 — End: 2022-11-06
  Administered 2022-11-02 – 2022-11-06 (×7): 3 mL via INTRAVENOUS

## 2022-11-01 NOTE — ED Provider Notes (Signed)
[place] [facility]ERGENCY DEPARTMENT AT [facility] Provider Note  CSN: [id number] Arrival date & time: 11/01/22 1850  Chief Complaint(s) Fall  HPI Tina Bryant is a 77 y.o. female with past medical history as below, significant for CKD DVT DM hypertension who presents to the ED with complaint of fall, AMS  Patient here via EMS secondary to fall.  She is unclear surrounding circumstances of her fall.  She remembers walking to her back room to adjust the window and then woke up on the floor, she is unsure if she had head injury.  She does not have a current headache, reports some pain to her knees and to her low back.  She reports she last took her Eliquis this morning.  Denies any chest pain, palpitations or dyspnea.  No numbness or tingling to extremities.  Past Medical History Past Medical History:  Diagnosis Date   Chronic kidney disease    Diabetes mellitus without complication (HCC)    borderline   DVT (deep venous thrombosis) (HCC)    Hepatitis    b   Hypertension    Patient Active Problem List   Diagnosis Date Noted   DVT (deep venous thrombosis) (HCC) 11/01/2022   Fall at home, initial encounter 11/01/2022   CAP (community acquired pneumonia) 11/01/2022   Sepsis (HCC) 11/01/2022   AKI (acute kidney injury) (HCC) 11/01/2022   Acute metabolic encephalopathy 11/01/2022   Acute deep vein thrombosis (DVT) of femoral vein of left lower extremity (HCC) 06/08/2022   HYPERLIPIDEMIA 07/09/2006   Microcytic anemia 07/09/2006   DEPRESSION 07/09/2006   Essential hypertension 07/09/2006   Home Medication(s) Prior to Admission medications   Medication Sig Start Date End Date Taking? Authorizing Provider  apixaban (ELIQUIS) 5 MG TABS tablet Take 1 tablet (5 mg total) by mouth 2 (two) times daily. Start taking after completion of starter pack. 06/08/22   Cephus Shelling, MD  APIXABAN Everlene Balls) VTE STARTER PACK (10MG  AND 5MG ) Take as directed on package: start with  two-5mg  tablets twice daily for 7 days. On day 8, switch to one-5mg  tablet twice daily. Patient not taking: Reported on 10/05/2022 06/08/22   Cephus Shelling, MD  atorvastatin (LIPITOR) 40 MG tablet Take 40 mg by mouth daily. 03/14/22   [provider]  carboxymethylcellulose (REFRESH PLUS) 0.5 % SOLN Place 1 drop into both eyes in the morning and at bedtime.    [provider]  Cholecalciferol (VITAMIN D-1000 MAX ST) 25 MCG (1000 UT) tablet Take 1,000 Units by mouth daily. 08/24/13   [provider]  ferrous sulfate 325 (65 FE) MG EC tablet Take 325 mg by mouth daily.    [provider]  Flaxseed, Linseed, (FLAXSEED OIL PO) Take 1 capsule by mouth daily.    [provider]  losartan (COZAAR) 25 MG tablet Take 25 mg by mouth daily.    [provider]  Multiple Vitamins-Minerals (CENTRUM SILVER PO) Take 1 tablet by mouth daily.    [provider]  spironolactone-hydrochlorothiazide (ALDACTAZIDE) 25-25 MG tablet Take 1 tablet by mouth daily.    [provider]  Past Surgical History History reviewed. No pertinent surgical history. Family History History reviewed. No pertinent family history.  Social History Social History   Tobacco Use   Smoking status: Never  Vaping Use   Vaping status: Never Used  Substance Use Topics   Alcohol use: No   Drug use: No   Allergies Tramadol and Lisinopril  Review of Systems Review of Systems  Unable to perform ROS: Mental status change  Constitutional:  Negative for chills and fever.  Respiratory:  Positive for cough. Negative for shortness of breath.   Cardiovascular:  Negative for chest pain.  Gastrointestinal:  Positive for abdominal pain and nausea. Negative for vomiting.  Genitourinary:  Negative for dysuria and hematuria.  Neurological:   Positive for weakness.  All other systems reviewed and are negative.   Physical Exam Vital Signs  I have reviewed the triage vital signs BP (!) 110/48   Pulse (!) 102   Temp 98.1 F (36.7 C) (Oral)   Resp 17   Ht 5\' 6"  (1.676 m)   Wt 89.8 kg   SpO2 100%   BMI 31.96 kg/m  Physical Exam Vitals and nursing note reviewed. Exam conducted with a chaperone present.  Constitutional:      General: She is not in acute distress.    Appearance: Normal appearance.  HENT:     Head: Normocephalic and atraumatic.     Right Ear: External ear normal.     Left Ear: External ear normal.     Nose: Nose normal.     Mouth/Throat:     Mouth: Mucous membranes are dry.  Eyes:     General: No scleral icterus.       Right eye: No discharge.        Left eye: No discharge.  Cardiovascular:     Rate and Rhythm: Normal rate and regular rhythm.     Pulses: Normal pulses.     Heart sounds: Normal heart sounds.  Pulmonary:     Effort: Pulmonary effort is normal. Tachypnea present. No respiratory distress.     Breath sounds: No stridor. Decreased breath sounds present.     Comments: Coarse breath sounds b/l Abdominal:     General: Abdomen is flat. There is no distension.     Palpations: Abdomen is soft.     Tenderness: There is generalized abdominal tenderness.  Musculoskeletal:     Cervical back: No rigidity.     Right lower leg: No edema.     Left lower leg: No edema.  Skin:    General: Skin is warm and dry.     Capillary Refill: Capillary refill takes less than 2 seconds.  Neurological:     Mental Status: She is alert.     GCS: GCS eye subscore is 4. GCS verbal subscore is 4. GCS motor subscore is 6.     Cranial Nerves: Cranial nerves 2-12 are intact. No facial asymmetry.     Sensory: Sensation is intact. No sensory deficit.     Motor: Motor function is intact. No weakness.     Coordination: Coordination is intact.     Comments: Gait testing deferred secondary to patient safety.    Psychiatric:        Mood and Affect: Mood normal.        Behavior: Behavior normal. Behavior is cooperative.     ED Results and Treatments Labs (all labs ordered are listed, but only abnormal results are displayed) Labs Reviewed  CBC WITH DIFFERENTIAL/PLATELET - Abnormal;  Notable for the following components:      Result Value   WBC 26.8 (*)    Hemoglobin 9.0 (*)    HCT 28.7 (*)    MCV 66.3 (*)    MCH 20.8 (*)    RDW 16.7 (*)    Neutro Abs 24.4 (*)    Lymphs Abs 0.6 (*)    Monocytes Absolute 1.3 (*)    Abs Immature Granulocytes 0.45 (*)    All other components within normal limits  COMPREHENSIVE METABOLIC PANEL - Abnormal; Notable for the following components:   Sodium 132 (*)    Chloride 97 (*)    CO2 17 (*)    Glucose, Bld 158 (*)    BUN 39 (*)    Creatinine, Ser 2.35 (*)    Albumin 2.9 (*)    AST 61 (*)    ALT 74 (*)    Total Bilirubin 1.3 (*)    GFR, Estimated 21 (*)    Anion gap 18 (*)    All other components within normal limits  PROTIME-INR - Abnormal; Notable for the following components:   Prothrombin Time 23.1 (*)    INR 2.0 (*)    All other components within normal limits  I-STAT CG4 LACTIC ACID, ED - Abnormal; Notable for the following components:   Lactic Acid, Venous 2.4 (*)    All other components within normal limits  I-STAT CHEM 8, ED - Abnormal; Notable for the following components:   Sodium 133 (*)    BUN 36 (*)    Creatinine, Ser 2.40 (*)    Glucose, Bld 161 (*)    Calcium, Ion 1.09 (*)    TCO2 19 (*)    Hemoglobin 10.5 (*)    HCT 31.0 (*)    All other components within normal limits  TROPONIN I (HIGH SENSITIVITY) - Abnormal; Notable for the following components:   Troponin I (High Sensitivity) 51 (*)    All other components within normal limits  CULTURE, BLOOD (ROUTINE X 2)  CULTURE, BLOOD (ROUTINE X 2)  LIPASE, BLOOD  CK  URINALYSIS, ROUTINE W REFLEX MICROSCOPIC  PATHOLOGIST SMEAR REVIEW  I-STAT CG4 LACTIC ACID, ED  TROPONIN I  (HIGH SENSITIVITY)                                                                                                                          Radiology CT CHEST ABDOMEN PELVIS WO CONTRAST  Result Date: 11/01/2022 CLINICAL DATA:  Blunt poly trauma, found down, altered mental status EXAM: CT CHEST, ABDOMEN AND PELVIS WITHOUT CONTRAST TECHNIQUE: Multidetector CT imaging of the chest, abdomen and pelvis was performed following the standard protocol without IV contrast. RADIATION DOSE REDUCTION: This exam was performed according to the departmental dose-optimization program which includes automated exposure control, adjustment of the mA and/or kV according to patient size and/or use of iterative reconstruction technique. COMPARISON:  None Available. FINDINGS: CT CHEST FINDINGS Cardiovascular: No significant coronary artery calcification. Global cardiac size  within normal limits. No pericardial effusion. Central pulmonary arteries are of normal caliber. Mild atherosclerotic calcification within the thoracic aorta. No aortic aneurysm. Mediastinum/Nodes: No enlarged mediastinal, hilar, or axillary lymph nodes. Thyroid gland, trachea, and esophagus demonstrate no significant findings. Lungs/Pleura: Mild nodular ground-glass pulmonary infiltrate within the right apex is nonspecific, possibly infectious or inflammatory in nature. Mild superimposed right upper lobe atelectasis or scarring. No focal consolidation. No pneumothorax or pleural effusion. No central obstructing lesion. Musculoskeletal: Osseous structures are age-appropriate. No acute bone abnormality. No lytic or blastic bone lesion CT ABDOMEN PELVIS FINDINGS Hepatobiliary: No focal liver abnormality is seen. No gallstones, gallbladder wall thickening, or biliary dilatation. Pancreas: Unremarkable Spleen: Unremarkable Adrenals/Urinary Tract: 2.6 cm nodule within the left adrenal gland demonstrates a mean density of 8 Hounsfield units and is compatible with a  benign adrenal adenoma. No follow-up imaging is recommended for this lesion. The right adrenal gland is unremarkable. The kidneys are unremarkable on this noncontrast examination. Bladder unremarkable. Stomach/Bowel: Mild to moderate pancolonic diverticulosis, most severe within the sigmoid colon. The stomach, small bowel, and large bowel are otherwise unremarkable. No evidence of obstruction or focal inflammation. No free intraperitoneal gas or fluid. The appendix is normal. Vascular/Lymphatic: Aortic atherosclerosis. No enlarged abdominal or pelvic lymph nodes. Reproductive: Uterus and bilateral adnexa are unremarkable. Other: No abdominal wall hernia Musculoskeletal: Degenerative changes are seen throughout the lumbar spine. No acute bone abnormality. No lytic or blastic bone lesion are identified. IMPRESSION: 1. No acute intrathoracic or intra-abdominal injury. 2. Mild nodular ground-glass pulmonary infiltrate within the right apex, possibly infectious or inflammatory in nature. 3. 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 No follow-up imaging is recommended for this lesion. 4. Mild to moderate pancolonic diverticulosis, most severe within the sigmoid colon. Aortic Atherosclerosis (ICD10-I70.0). Electronically Signed   By: Helyn Numbers M.D.   On: 11/01/2022 20:46   CT HEAD WO CONTRAST ( )  Result Date: 11/01/2022 CLINICAL DATA:  Blunt poly trauma, found down, altered mental status EXAM: CT HEAD WITHOUT CONTRAST TECHNIQUE: Contiguous axial images were obtained from the base of the skull through the vertex without intravenous contrast. RADIATION DOSE REDUCTION: This exam was performed according to the departmental dose-optimization program which includes automated exposure control, adjustment of the mA and/or kV according to patient size and/or use of iterative reconstruction technique. COMPARISON:  None Available. FINDINGS: Brain: Normal anatomic configuration. Parenchymal volume loss is commensurate with the  patient's age. Mild subcortical and periventricular white matter changes are present likely reflecting the sequela of small vessel ischemia. No abnormal intra or extra-axial mass lesion or fluid collection. No abnormal mass effect or midline shift. No evidence of acute intracranial hemorrhage or infarct. Ventricular size is normal. Cerebellum unremarkable. Vascular: No asymmetric hyperdense vasculature at the skull base. Skull: Intact Sinuses/Orbits: Paranasal sinuses are clear. Orbits are unremarkable. Other: Mastoid air cells and middle ear cavities are clear. IMPRESSION: 1. No acute intracranial hemorrhage or infarct. No calvarial fracture. 2. Mild senescent change. Electronically Signed   By: Helyn Numbers M.D.   On: 11/01/2022 20:40   CT Cervical Spine Wo Contrast  Result Date: 11/01/2022 CLINICAL DATA:  Neck trauma.  Patient was found down, unknown time. EXAM: CT CERVICAL SPINE WITHOUT CONTRAST TECHNIQUE: Multidetector CT imaging of the cervical spine was performed without intravenous contrast. Multiplanar CT image reconstructions were also generated. RADIATION DOSE REDUCTION: This exam was performed according to the departmental dose-optimization program which includes automated exposure control, adjustment of the mA and/or kV according to patient size and/or  use of iterative reconstruction technique. COMPARISON:  None Available. FINDINGS: Alignment: Normal alignment. Skull base and vertebrae: Skull base appears intact. No vertebral compression deformities. No focal bone lesion or bone destruction. Bone cortex appears intact. Soft tissues and spinal canal: No prevertebral soft tissue swelling. No abnormal paraspinal soft tissue mass or infiltration. Disc levels: Degenerative changes throughout the cervical spine with narrowed interspaces and endplate osteophyte formation. Degenerative changes in the facet joints and at C1-2. Degenerative changes in the temporomandibular joints. Uncovertebral and facet  joint spurring causes bone encroachment upon neural foramina at multiple levels bilaterally. Upper chest: Mild patchy infiltrates in the right upper lung, possibly pneumonia. Other: None. IMPRESSION: 1. Normal alignment.  No acute displaced fractures identified. 2. Prominent degenerative changes throughout the cervical spine. 3. Mild patchy infiltrates are suggested in the right upper lung, possibly pneumonia. Electronically Signed   By: Burman Nieves M.D.   On: 11/01/2022 20:39   CT T-SPINE NO CHARGE  Result Date: 11/01/2022 CLINICAL DATA:  Unwitnessed fall, pain. EXAM: CT THORACIC SPINE WITHOUT CONTRAST TECHNIQUE: Multidetector CT images of the thoracic were obtained using the standard protocol without intravenous contrast. RADIATION DOSE REDUCTION: This exam was performed according to the departmental dose-optimization program which includes automated exposure control, adjustment of the mA and/or kV according to patient size and/or use of iterative reconstruction technique. COMPARISON:  None Available. FINDINGS: Alignment: Mild dextroscoliosis. Vertebrae: No acute fracture. The vertebral body heights are normal. The posterior elements are intact. Paraspinal and other soft tissues: Assessed fully on concurrent chest CT, reported separately. Disc levels: Mild diffuse disc space narrowing and anterior spurring. No high-grade canal stenosis. IMPRESSION: Mild degenerative change of the thoracic spine. No acute fracture. Electronically Signed   By: Narda Rutherford M.D.   On: 11/01/2022 20:38   CT L-SPINE NO CHARGE  Result Date: 11/01/2022 CLINICAL DATA:  Unwitnessed fall.  Pain. EXAM: CT LUMBAR SPINE WITHOUT CONTRAST TECHNIQUE: Multidetector CT imaging of the lumbar spine was performed without intravenous contrast administration. Multiplanar CT image reconstructions were also generated. RADIATION DOSE REDUCTION: This exam was performed according to the departmental dose-optimization program which includes  automated exposure control, adjustment of the mA and/or kV according to patient size and/or use of iterative reconstruction technique. COMPARISON:  None Available. FINDINGS: Segmentation: 5 lumbar type vertebrae. Alignment: Mild dextroscoliotic curvature.  No listhesis. Vertebrae: No acute fracture. Vertebral body heights are preserved. The posterior elements are intact. Paraspinal and other soft tissues: Assessed on concurrent abdominopelvic CT, reported separately. Disc levels: Advanced multilevel degenerative disc disease and facet hypertrophy with diffuse disc space narrowing, spurring, and vacuum phenomenon. Modic endplate changes at the L3-L4 level. Spinal canal stenosis at L3-L4 and L4-L5. There is multilevel neural foraminal stenosis. IMPRESSION: 1. No acute fracture or subluxation of the lumbar spine. 2. 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 Electronically Signed   By: Narda Rutherford M.D.   On: 11/01/2022 20:36   DG Chest Portable 1 View  Result Date: 11/01/2022 CLINICAL DATA:  Fall EXAM: PORTABLE CHEST 1 VIEW COMPARISON:  Chest x-ray 08/22/2014 FINDINGS: The heart size and mediastinal contours are within normal limits. Both lungs are clear. The visualized skeletal structures are unremarkable. IMPRESSION: No active disease. Electronically Signed   By: Darliss Cheney M.D.   On: 11/01/2022 19:58    Pertinent labs & imaging results that were available during my care of the patient were reviewed by me and considered in my medical decision making (see MDM for details).  Medications Ordered  in ED Medications  lactated ringers infusion (has no administration in time range)  cefTRIAXone (ROCEPHIN) 2 g in sodium chloride 0.9 % 100 mL IVPB (has no administration in time range)  azithromycin (ZITHROMAX) 500 mg in sodium chloride 0.9 % 250 mL IVPB (has no administration in time range)  lactated ringers bolus 1,000 mL (1,000 mLs Intravenous New Bag/Given 11/01/22 1917)   lactated ringers bolus 1,000 mL (1,000 mLs Intravenous New Bag/Given 11/01/22 1952)                                                                                                                                     Procedures .Critical Care  Performed by: Sloan Leiter, DO Authorized by: Sloan Leiter, DO   Critical care provider statement:    Critical care time (minutes):  69   Critical care time was exclusive of:  Separately billable procedures and treating other patients   Critical care was necessary to treat or prevent imminent or life-threatening deterioration of the following conditions:  Sepsis, dehydration and cardiac failure   Critical care was time spent personally by me on the following activities:  Development of treatment plan with patient or surrogate, discussions with consultants, evaluation of patient's response to treatment, examination of patient, ordering and review of laboratory studies, ordering and review of radiographic studies, ordering and performing treatments and interventions, pulse oximetry, re-evaluation of patient's condition, review of old charts and obtaining history from patient or surrogate   Care discussed with: admitting provider     (including critical care time)  Medical Decision Making / ED Course    Medical Decision Making:    Maysie Oakland is a 77 y.o. female with past medical history as below, significant for CKD DVT DM hypertension who presents to the ED with complaint of fall, AMS. The complaint involves an extensive differential diagnosis and also carries with it a high risk of complications and morbidity.  Serious etiology was considered. Ddx includes but is not limited to: Differential diagnoses for altered mental status includes but is not exclusive to alcohol, illicit or prescription medications, intracranial pathology such as stroke, intracerebral hemorrhage, fever or infectious causes including sepsis, hypoxemia, uremia, trauma,  endocrine related disorders such as diabetes, hypoglycemia, thyroid-related diseases, etc.   Complete initial physical exam performed, notably the patient  was hypotensive on arrival, GCS 14, confused regarding fall today.    Reviewed and confirmed nursing documentation for past medical history, family history, social history.  Vital signs reviewed.    Clinical Course as of 11/01/22 2114  Thu Nov 01, 2022  1858 Pt took eliquis this AM, she had a fall this evening, she is amnesic to the event. Will upgrade level 2 trauma, FOT/pos head inj [SG]  1951 Creatinine(!): 2.40 Cr was <1 two weeks ago; AKI [SG]  2056 CT concerning for pneumonia, LA elev, hypotensive but improving, WBC elev. Concern for sepsis 2/2 PNA> start  abx, code sepsis [SG]    Clinical Course User Index [SG] Sloan Leiter, DO     Patient with a fall earlier today, she is confused regarding the circumstances of her fall.  Is unsure if she had LOC or head injury.  She is on Eliquis.  Level 2 trauma was activated.  Pressure improved with fluids  Her labs are concerning for infectious etiology, possible pneumonia on imaging, start broad-spectrum antibiotics, sepsis bundle.  She has AKI, elevated BUN,  lactic acidosis, leukocytosis; hemodynamics are improving after 2 L IV fluid.  Not appear to have septic shock  Recommend admission for sepsis, pneumonia, AKI, syncope workup; pt agreeable  Spoke with Dr Janalyn Shy, accepts pt for admit                  Additional history obtained: -Additional history obtained from EMS -External records from outside source obtained and reviewed including: Chart review including previous notes, labs, imaging, consultation notes including  Home medications, prior labs and imaging   Lab Tests: -I ordered, reviewed, and interpreted labs.   The pertinent results include:   Labs Reviewed  CBC WITH DIFFERENTIAL/PLATELET - Abnormal; Notable for the following components:      Result  Value   WBC 26.8 (*)    Hemoglobin 9.0 (*)    HCT 28.7 (*)    MCV 66.3 (*)    MCH 20.8 (*)    RDW 16.7 (*)    Neutro Abs 24.4 (*)    Lymphs Abs 0.6 (*)    Monocytes Absolute 1.3 (*)    Abs Immature Granulocytes 0.45 (*)    All other components within normal limits  COMPREHENSIVE METABOLIC PANEL - Abnormal; Notable for the following components:   Sodium 132 (*)    Chloride 97 (*)    CO2 17 (*)    Glucose, Bld 158 (*)    BUN 39 (*)    Creatinine, Ser 2.35 (*)    Albumin 2.9 (*)    AST 61 (*)    ALT 74 (*)    Total Bilirubin 1.3 (*)    GFR, Estimated 21 (*)    Anion gap 18 (*)    All other components within normal limits  PROTIME-INR - Abnormal; Notable for the following components:   Prothrombin Time 23.1 (*)    INR 2.0 (*)    All other components within normal limits  I-STAT CG4 LACTIC ACID, ED - Abnormal; Notable for the following components:   Lactic Acid, Venous 2.4 (*)    All other components within normal limits  I-STAT CHEM 8, ED - Abnormal; Notable for the following components:   Sodium 133 (*)    BUN 36 (*)    Creatinine, Ser 2.40 (*)    Glucose, Bld 161 (*)    Calcium, Ion 1.09 (*)    TCO2 19 (*)    Hemoglobin 10.5 (*)    HCT 31.0 (*)    All other components within normal limits  TROPONIN I (HIGH SENSITIVITY) - Abnormal; Notable for the following components:   Troponin I (High Sensitivity) 51 (*)    All other components within normal limits  CULTURE, BLOOD (ROUTINE X 2)  CULTURE, BLOOD (ROUTINE X 2)  LIPASE, BLOOD  CK  URINALYSIS, ROUTINE W REFLEX MICROSCOPIC  PATHOLOGIST SMEAR REVIEW  I-STAT CG4 LACTIC ACID, ED  TROPONIN I (HIGH SENSITIVITY)    Notable for as above  EKG   EKG Interpretation Date/Time:  Thursday November 01 2022 19:14:13 EDT  Ventricular Rate:  109 PR Interval:  158 QRS Duration:  75 QT Interval:  329 QTC Calculation: 443 R Axis:   25  Text Interpretation: Sinus tachycardia Confirmed by Tanda Rockers ([id number]) on 11/01/2022 7:51:52  PM         Imaging Studies ordered: I ordered imaging studies including CT head/cervical, chest abd pelvis, chest x-ray I independently visualized the following imaging with scope of interpretation limited to determining acute life threatening conditions related to emergency care; findings noted above I independently visualized and interpreted imaging. I agree with the radiologist interpretation   Medicines ordered and prescription drug management: Meds ordered this encounter  Medications   DISCONTD: sodium chloride 0.9 % bolus 1,000 mL   lactated ringers bolus 1,000 mL   lactated ringers bolus 1,000 mL   lactated ringers infusion   cefTRIAXone (ROCEPHIN) 2 g in sodium chloride 0.9 % 100 mL IVPB    Order Specific Question:   Antibiotic Indication:    Answer:   CAP   azithromycin (ZITHROMAX) 500 mg in sodium chloride 0.9 % 250 mL IVPB    Order Specific Question:   Antibiotic Indication:    Answer:   CAP    -I have reviewed the patients home medicines and have made adjustments as needed   Consultations Obtained: na   Cardiac Monitoring: The patient was maintained on a cardiac monitor.  I personally viewed and interpreted the cardiac monitored which showed an underlying rhythm of: sinus tachycardia Continuous pulse oximetry interpreted by myself, 100% on RA.    Social Determinants of Health:  Diagnosis or treatment significantly limited by social determinants of health: lives alone   Reevaluation: After the interventions noted above, I reevaluated the patient and found that they have improved  Co morbidities that complicate the patient evaluation  Past Medical History:  Diagnosis Date   Chronic kidney disease    Diabetes mellitus without complication (HCC)    borderline   DVT (deep venous thrombosis) (HCC)    Hepatitis    b   Hypertension       Dispostion: Disposition decision including need for hospitalization was considered, and patient admitted to the  hospital.    Final Clinical Impression(s) / ED Diagnoses Final diagnoses:  Fall, initial encounter  Altered mental status, unspecified altered mental status type  Sepsis with acute renal failure, due to unspecified organism, unspecified acute renal failure type, unspecified whether septic shock present (HCC)  AKI (acute kidney injury) (HCC)        Sloan Leiter, DO 11/01/22 2114

## 2022-11-01 NOTE — Sepsis Progress Note (Addendum)
Elink monitoring for the code sepsis protocol.   Antibiotics administered prior to blood cultures being drawn per bedside RN. Patient is a difficult stick.

## 2022-11-01 NOTE — ED Notes (Signed)
Creatinine too high for contrast CT at this time - Dr. Wallace Cullens notified. Completed non contrast scans, will hydrate and re-eval creatinine after fluids to see if appropriate for contrast PE scan later in evening.

## 2022-11-01 NOTE — ED Triage Notes (Signed)
Patient bib [facility] from home. Patients family saw her last on Monday and sent for a welfare check and fire forced entry in the house and found her in the floor trying to get up. Unknown of downtime, patient doesn't know either. Family reports that she is lethargic compared to her baseline.   Patient unsure of when she fell but states she took eliquis this morning. Denies LOC, has chronic back  pain, and knee pain.

## 2022-11-01 NOTE — ED Notes (Signed)
X-ray at bedside

## 2022-11-01 NOTE — ED Notes (Signed)
ED TO INPATIENT HANDOFF REPORT  ED Nurse Name and Phone #: Grover Canavan 33  S Name/Age/Gender Tina Bryant 77 y.o. female Room/Bed: 026C/026C  Code Status   Code Status: Full Code  Home/SNF/Other Home Patient oriented to: self, place, time, and situation Is this baseline? Yes   Triage Complete: Triage complete  Chief Complaint Sepsis [facility]) [A41.9]  Triage Note Patient bib [facility] from home. Patients family saw her last on Monday and sent for a welfare check and fire forced entry in the house and found her in the floor trying to get up. Unknown of downtime, patient doesn't know either. Family reports that she is lethargic compared to her baseline.   Patient unsure of when she fell but states she took eliquis this morning. Denies LOC, has chronic back  pain, and knee pain.    Allergies Allergies  Allergen Reactions   Tramadol     States it caused bruises on her body   Lisinopril Cough    Level of Care/Admitting Diagnosis ED Disposition     ED Disposition  Admit   Condition  --   Comment  Hospital Area: [facility] [[id number]]  Level of Care: Telemetry Medical [104]  May admit patient to [facility] or [facility] if equivalent level of care is available:: No  Covid Evaluation: Asymptomatic - no recent exposure (last 10 days) testing not required  Diagnosis: Sepsis [facility]) [[id number]]  Admitting Physician: Tereasa Coop [[id number]]  Attending Physician: Tereasa Coop [[id number]]  Certification:: I certify this patient will need inpatient services for at least 2 midnights  Expected Medical Readiness: 11/07/2022          B Medical/Surgery History Past Medical History:  Diagnosis Date   Chronic kidney disease    Diabetes mellitus without complication (HCC)    borderline   DVT (deep venous thrombosis) (HCC)    Hepatitis    b   Hypertension    History reviewed. No pertinent surgical history.   A IV Location/Drains/Wounds Patient  Lines/Drains/Airways Status     Active Line/Drains/Airways     Name Placement date Placement time Site Days   Peripheral IV 11/01/22 20 G Anterior;Left Forearm 11/01/22  1906  Forearm  less than 1            Intake/Output Last 24 hours No intake or output data in the 24 hours ending 11/01/22 2127  Labs/Imaging Results for orders placed or performed during the hospital encounter of 11/01/22 (from the past 48 hour(s))  CBC with Differential     Status: Abnormal   Collection Time: 11/01/22  7:15 PM  Result Value Ref Range   WBC 26.8 (H) 4.0 - 10.5 K/uL   RBC 4.33 3.87 - 5.11 MIL/uL   Hemoglobin 9.0 (L) 12.0 - 15.0 g/dL   HCT 36.6 (L) 44.0 - 34.7 %   MCV 66.3 (L) 80.0 - 100.0 fL   MCH 20.8 (L) 26.0 - 34.0 pg   MCHC 31.4 30.0 - 36.0 g/dL   RDW 42.5 (H) 95.6 - 38.7 %   Platelets 276 150 - 400 K/uL    Comment: REPEATED TO VERIFY   nRBC 0.0 0.0 - 0.2 %   Neutrophils Relative % 91 %   Neutro Abs 24.4 (H) 1.7 - 7.7 K/uL   Lymphocytes Relative 2 %   Lymphs Abs 0.6 (L) 0.7 - 4.0 K/uL   Monocytes Relative 5 %   Monocytes Absolute 1.3 (H) 0.1 - 1.0 K/uL   Eosinophils Relative 0 %  Eosinophils Absolute 0.0 0.0 - 0.5 K/uL   Basophils Relative 0 %   Basophils Absolute 0.1 0.0 - 0.1 K/uL   Immature Granulocytes 2 %   Abs Immature Granulocytes 0.45 (H) 0.00 - 0.07 K/uL   Schistocytes PRESENT    Burr Cells PRESENT     Comment: Performed at [facility], [address], [place], [place] [address]  Comprehensive metabolic panel     Status: Abnormal   Collection Time: 11/01/22  7:15 PM  Result Value Ref Range   Sodium 132 (L) 135 - 145 mmol/L   Potassium 4.0 3.5 - 5.1 mmol/L   Chloride 97 (L) 98 - 111 mmol/L   CO2 17 (L) 22 - 32 mmol/L   Glucose, Bld 158 (H) 70 - 99 mg/dL    Comment: Glucose reference range applies only to samples taken after fasting for at least 8 hours.   BUN 39 (H) 8 - 23 mg/dL   Creatinine, Ser 6.04 (H) 0.44 - 1.00 mg/dL   Calcium 9.2 8.9 - 54.0 mg/dL    Total Protein 7.0 6.5 - 8.1 g/dL   Albumin 2.9 (L) 3.5 - 5.0 g/dL   AST 61 (H) 15 - 41 U/L   ALT 74 (H) 0 - 44 U/L   Alkaline Phosphatase 64 38 - 126 U/L   Total Bilirubin 1.3 (H) 0.3 - 1.2 mg/dL   GFR, Estimated 21 (L) >60 mL/min    Comment: (NOTE) Calculated using the CKD-EPI Creatinine Equation (2021)    Anion gap 18 (H) 5 - 15    Comment: Performed at [facility], [address] [place], [place] [address]  Lipase, blood     Status: None   Collection Time: 11/01/22  7:15 PM  Result Value Ref Range   Lipase 43 11 - 51 U/L    Comment: Performed at [facility], [address] [place], [place] [address]  CK     Status: None   Collection Time: 11/01/22  7:15 PM  Result Value Ref Range   Total CK 221 38 - 234 U/L    Comment: Performed at [facility], [address], [place], [place] [address]  Troponin I (High Sensitivity)     Status: Abnormal   Collection Time: 11/01/22  7:15 PM  Result Value Ref Range   Troponin I (High Sensitivity) 51 (H) <18 ng/L    Comment: (NOTE) Elevated high sensitivity troponin I (hsTnI) values and significant  changes across serial measurements may suggest ACS but many other  chronic and acute conditions are known to elevate hsTnI results.  Refer to the "Links" section for chest pain algorithms and additional  guidance. Performed at [facility], [address], [place], [place] [address]   Protime-INR     Status: Abnormal   Collection Time: 11/01/22  7:15 PM  Result Value Ref Range   Prothrombin Time 23.1 (H) 11.4 - 15.2 seconds   INR 2.0 (H) 0.8 - 1.2    Comment: (NOTE) INR goal varies based on device and disease states. Performed at [facility], [address], [place], [place] [address]   I-stat chem 8, ED (not at [facility], [facility] or [facility])     Status: Abnormal   Collection Time: 11/01/22  7:21 PM  Result Value Ref Range   Sodium 133 (L) 135 - 145 mmol/L   Potassium 4.1 3.5 - 5.1 mmol/L   Chloride 102 98 - 111  mmol/L  BUN 36 (H) 8 - 23 mg/dL   Creatinine, Ser 4.09 (H) 0.44 - 1.00 mg/dL   Glucose, Bld 811 (H) 70 - 99 mg/dL    Comment: Glucose reference range applies only to samples taken after fasting for at least 8 hours.   Calcium, Ion 1.09 (L) 1.15 - 1.40 mmol/L   TCO2 19 (L) 22 - 32 mmol/L   Hemoglobin 10.5 (L) 12.0 - 15.0 g/dL   HCT 91.4 (L) 78.2 - 95.6 %  I-Stat CG4 Lactic Acid     Status: Abnormal   Collection Time: 11/01/22  7:22 PM  Result Value Ref Range   Lactic Acid, Venous 2.4 (HH) 0.5 - 1.9 mmol/L   Comment NOTIFIED PHYSICIAN    CT CHEST ABDOMEN PELVIS WO CONTRAST  Result Date: 11/01/2022 CLINICAL DATA:  Blunt poly trauma, found down, altered mental status EXAM: CT CHEST, ABDOMEN AND PELVIS WITHOUT CONTRAST TECHNIQUE: Multidetector CT imaging of the chest, abdomen and pelvis was performed following the standard protocol without IV contrast. RADIATION DOSE REDUCTION: This exam was performed according to the departmental dose-optimization program which includes automated exposure control, adjustment of the mA and/or kV according to patient size and/or use of iterative reconstruction technique. COMPARISON:  None Available. FINDINGS: CT CHEST FINDINGS Cardiovascular: No significant coronary artery calcification. Global cardiac size within normal limits. No pericardial effusion. Central pulmonary arteries are of normal caliber. Mild atherosclerotic calcification within the thoracic aorta. No aortic aneurysm. Mediastinum/Nodes: No enlarged mediastinal, hilar, or axillary lymph nodes. Thyroid gland, trachea, and esophagus demonstrate no significant findings. Lungs/Pleura: Mild nodular ground-glass pulmonary infiltrate within the right apex is nonspecific, possibly infectious or inflammatory in nature. Mild superimposed right upper lobe atelectasis or scarring. No focal consolidation. No pneumothorax or pleural effusion. No central obstructing lesion. Musculoskeletal: Osseous structures are  age-appropriate. No acute bone abnormality. No lytic or blastic bone lesion CT ABDOMEN PELVIS FINDINGS Hepatobiliary: No focal liver abnormality is seen. No gallstones, gallbladder wall thickening, or biliary dilatation. Pancreas: Unremarkable Spleen: Unremarkable Adrenals/Urinary Tract: 2.6 cm nodule within the left adrenal gland demonstrates a mean density of 8 Hounsfield units and is compatible with a benign adrenal adenoma. No follow-up imaging is recommended for this lesion. The right adrenal gland is unremarkable. The kidneys are unremarkable on this noncontrast examination. Bladder unremarkable. Stomach/Bowel: Mild to moderate pancolonic diverticulosis, most severe within the sigmoid colon. The stomach, small bowel, and large bowel are otherwise unremarkable. No evidence of obstruction or focal inflammation. No free intraperitoneal gas or fluid. The appendix is normal. Vascular/Lymphatic: Aortic atherosclerosis. No enlarged abdominal or pelvic lymph nodes. Reproductive: Uterus and bilateral adnexa are unremarkable. Other: No abdominal wall hernia Musculoskeletal: Degenerative changes are seen throughout the lumbar spine. No acute bone abnormality. No lytic or blastic bone lesion are identified. IMPRESSION: 1. No acute intrathoracic or intra-abdominal injury. 2. Mild nodular ground-glass pulmonary infiltrate within the right apex, possibly infectious or inflammatory in nature. 3. 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 No follow-up imaging is recommended for this lesion. 4. Mild to moderate pancolonic diverticulosis, most severe within the sigmoid colon. Aortic Atherosclerosis (ICD10-I70.0). Electronically Signed   By: Helyn Numbers M.D.   On: 11/01/2022 20:46   CT HEAD WO CONTRAST ( )  Result Date: 11/01/2022 CLINICAL DATA:  Blunt poly trauma, found down, altered mental status EXAM: CT HEAD WITHOUT CONTRAST TECHNIQUE: Contiguous axial images were obtained from the base of the skull through the vertex  without intravenous contrast. RADIATION DOSE REDUCTION: This exam was performed according to the departmental  dose-optimization program which includes automated exposure control, adjustment of the mA and/or kV according to patient size and/or use of iterative reconstruction technique. COMPARISON:  None Available. FINDINGS: Brain: Normal anatomic configuration. Parenchymal volume loss is commensurate with the patient's age. Mild subcortical and periventricular white matter changes are present likely reflecting the sequela of small vessel ischemia. No abnormal intra or extra-axial mass lesion or fluid collection. No abnormal mass effect or midline shift. No evidence of acute intracranial hemorrhage or infarct. Ventricular size is normal. Cerebellum unremarkable. Vascular: No asymmetric hyperdense vasculature at the skull base. Skull: Intact Sinuses/Orbits: Paranasal sinuses are clear. Orbits are unremarkable. Other: Mastoid air cells and middle ear cavities are clear. IMPRESSION: 1. No acute intracranial hemorrhage or infarct. No calvarial fracture. 2. Mild senescent change. Electronically Signed   By: Helyn Numbers M.D.   On: 11/01/2022 20:40   CT Cervical Spine Wo Contrast  Result Date: 11/01/2022 CLINICAL DATA:  Neck trauma.  Patient was found down, unknown time. EXAM: CT CERVICAL SPINE WITHOUT CONTRAST TECHNIQUE: Multidetector CT imaging of the cervical spine was performed without intravenous contrast. Multiplanar CT image reconstructions were also generated. RADIATION DOSE REDUCTION: This exam was performed according to the departmental dose-optimization program which includes automated exposure control, adjustment of the mA and/or kV according to patient size and/or use of iterative reconstruction technique. COMPARISON:  None Available. FINDINGS: Alignment: Normal alignment. Skull base and vertebrae: Skull base appears intact. No vertebral compression deformities. No focal bone lesion or bone destruction.  Bone cortex appears intact. Soft tissues and spinal canal: No prevertebral soft tissue swelling. No abnormal paraspinal soft tissue mass or infiltration. Disc levels: Degenerative changes throughout the cervical spine with narrowed interspaces and endplate osteophyte formation. Degenerative changes in the facet joints and at C1-2. Degenerative changes in the temporomandibular joints. Uncovertebral and facet joint spurring causes bone encroachment upon neural foramina at multiple levels bilaterally. Upper chest: Mild patchy infiltrates in the right upper lung, possibly pneumonia. Other: None. IMPRESSION: 1. Normal alignment.  No acute displaced fractures identified. 2. Prominent degenerative changes throughout the cervical spine. 3. Mild patchy infiltrates are suggested in the right upper lung, possibly pneumonia. Electronically Signed   By: Burman Nieves M.D.   On: 11/01/2022 20:39   CT T-SPINE NO CHARGE  Result Date: 11/01/2022 CLINICAL DATA:  Unwitnessed fall, pain. EXAM: CT THORACIC SPINE WITHOUT CONTRAST TECHNIQUE: Multidetector CT images of the thoracic were obtained using the standard protocol without intravenous contrast. RADIATION DOSE REDUCTION: This exam was performed according to the departmental dose-optimization program which includes automated exposure control, adjustment of the mA and/or kV according to patient size and/or use of iterative reconstruction technique. COMPARISON:  None Available. FINDINGS: Alignment: Mild dextroscoliosis. Vertebrae: No acute fracture. The vertebral body heights are normal. The posterior elements are intact. Paraspinal and other soft tissues: Assessed fully on concurrent chest CT, reported separately. Disc levels: Mild diffuse disc space narrowing and anterior spurring. No high-grade canal stenosis. IMPRESSION: Mild degenerative change of the thoracic spine. No acute fracture. Electronically Signed   By: Narda Rutherford M.D.   On: 11/01/2022 20:38   CT L-SPINE  NO CHARGE  Result Date: 11/01/2022 CLINICAL DATA:  Unwitnessed fall.  Pain. EXAM: CT LUMBAR SPINE WITHOUT CONTRAST TECHNIQUE: Multidetector CT imaging of the lumbar spine was performed without intravenous contrast administration. Multiplanar CT image reconstructions were also generated. RADIATION DOSE REDUCTION: This exam was performed according to the departmental dose-optimization program which includes automated exposure control, adjustment of the mA and/or kV  according to patient size and/or use of iterative reconstruction technique. COMPARISON:  None Available. FINDINGS: Segmentation: 5 lumbar type vertebrae. Alignment: Mild dextroscoliotic curvature.  No listhesis. Vertebrae: No acute fracture. Vertebral body heights are preserved. The posterior elements are intact. Paraspinal and other soft tissues: Assessed on concurrent abdominopelvic CT, reported separately. Disc levels: Advanced multilevel degenerative disc disease and facet hypertrophy with diffuse disc space narrowing, spurring, and vacuum phenomenon. Modic endplate changes at the L3-L4 level. Spinal canal stenosis at L3-L4 and L4-L5. There is multilevel neural foraminal stenosis. IMPRESSION: 1. No acute fracture or subluxation of the lumbar spine. 2. 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 Electronically Signed   By: Narda Rutherford M.D.   On: 11/01/2022 20:36   DG Chest Portable 1 View  Result Date: 11/01/2022 CLINICAL DATA:  Fall EXAM: PORTABLE CHEST 1 VIEW COMPARISON:  Chest x-ray 08/22/2014 FINDINGS: The heart size and mediastinal contours are within normal limits. Both lungs are clear. The visualized skeletal structures are unremarkable. IMPRESSION: No active disease. Electronically Signed   By: Darliss Cheney M.D.   On: 11/01/2022 19:58    Pending Labs Unresulted Labs (From admission, onward)     Start     Ordered   11/02/22 0500  Comprehensive metabolic panel  Tomorrow morning,   R        11/01/22 2122    11/02/22 0500  CBC  Tomorrow morning,   R        11/01/22 2122   11/01/22 2123  Culture, blood (routine x 2) Call MD if unable to obtain prior to antibiotics being given  (COPD / Pneumonia / Cellulitis / Lower Extremity Wound)  BLOOD CULTURE X 2,   R (with TIMED occurrences)     Comments: If blood cultures drawn in Emergency Department - Do not draw and cancel order    11/01/22 2122   11/01/22 2123  Expectorated Sputum Assessment w Gram Stain, Rflx to Resp Cult  (COPD / Pneumonia / Cellulitis / Lower Extremity Wound)  Once,   R        11/01/22 2122   11/01/22 2123  Legionella Pneumophila Serogp 1 Ur Ag  (COPD / Pneumonia / Cellulitis / Lower Extremity Wound)  Once,   R        11/01/22 2122   11/01/22 2123  Strep pneumoniae urinary antigen  (COPD / Pneumonia / Cellulitis / Lower Extremity Wound)  Once,   R        11/01/22 2122   11/01/22 2123  Respiratory (~20 pathogens) panel by PCR  (Respiratory panel by PCR (~20 pathogens, ~24 hr TAT)  w precautions)  Once,   R        11/01/22 2122   11/01/22 2054  Blood Culture (routine x 2)  (Septic presentation on arrival (screening labs, nursing and treatment orders for obvious sepsis))  BLOOD CULTURE X 2,   STAT      11/01/22 2055   11/01/22 1915  Pathologist smear review  Once,   R        11/01/22 1915   11/01/22 1854  Urinalysis, Routine w reflex microscopic -Urine, Clean Catch  Once,   URGENT       Question:  Specimen Source  Answer:  Urine, Clean Catch   11/01/22 1853            Vitals/Pain Today's Vitals   11/01/22 1915 11/01/22 1925 11/01/22 1950 11/01/22 2015  BP: (!) 104/59 (!) 107/57 108/61 (!) 110/48  Pulse:    Marland Kitchen)  102  Resp: (!) 25 (!) 25 (!) 26 17  Temp: 98.1 F (36.7 C)     TempSrc: Oral     SpO2: 100%   100%  Weight:      Height:      PainSc:        Isolation Precautions Droplet precaution  Medications Medications  lactated ringers infusion (has no administration in time range)  cefTRIAXone (ROCEPHIN) 2 g in  sodium chloride 0.9 % 100 mL IVPB (has no administration in time range)  azithromycin (ZITHROMAX) 500 mg in sodium chloride 0.9 % 250 mL IVPB (has no administration in time range)  apixaban (ELIQUIS) tablet 5 mg (has no administration in time range)  ferrous sulfate tablet 325 mg (has no administration in time range)  cefTRIAXone (ROCEPHIN) 2 g in sodium chloride 0.9 % 100 mL IVPB (has no administration in time range)  doxycycline (VIBRAMYCIN) 100 mg in sodium chloride 0.9 % 250 mL IVPB (has no administration in time range)  sodium chloride flush (NS) 0.9 % injection 3 mL (has no administration in time range)  sodium chloride flush (NS) 0.9 % injection 3 mL (has no administration in time range)  0.9 %  sodium chloride infusion (has no administration in time range)  acetaminophen (TYLENOL) tablet 650 mg (has no administration in time range)    Or  acetaminophen (TYLENOL) suppository 650 mg (has no administration in time range)  senna-docusate (Senokot-S) tablet 1 tablet (has no administration in time range)  ondansetron (ZOFRAN) tablet 4 mg (has no administration in time range)    Or  ondansetron (ZOFRAN) injection 4 mg (has no administration in time range)  insulin aspart (novoLOG) injection 0-6 Units (has no administration in time range)  insulin aspart (novoLOG) injection 0-5 Units (has no administration in time range)  lactated ringers bolus 1,000 mL (1,000 mLs Intravenous New Bag/Given 11/01/22 1917)  lactated ringers bolus 1,000 mL (1,000 mLs Intravenous New Bag/Given 11/01/22 1952)    Mobility Walks at baseline, too weak to stand at this time      Focused Assessments Secondary assessment    R Recommendations: See Admitting Provider Note  Report given to:   Additional Notes:

## 2022-11-01 NOTE — H&P (Addendum)
History and Physical    Tina Bryant ZOX:[id number] DOB: 1945-11-29 DOA: 11/01/2022  PCP: Felix Pacini, FNP   Patient coming from: Home   Chief Complaint:  Chief Complaint  Patient presents with   Fall   ED TRIAGE note:Patient bib [facility] from home. Patients family saw her last on Monday and sent for a welfare check and fire forced entry in the house and found her in the floor trying to get up. Unknown of downtime, patient doesn't know either. Family reports that she is lethargic compared to her baseline.   Patient unsure of when she fell but states she took eliquis this morning. Denies LOC, has chronic back  pain, and knee pain.   HPI:  Tina Bryant is a 77 y.o. female with medical history significant of DVT of the lower extremity, essential hypertension, CKD stage 3B, dyslipidemia and DM type II presented to emergency department with complaining of fall and altered mental status. Patient here via EMS secondary to fall.  She is unclear surrounding circumstances of her fall.  She remembers walking to her back room to adjust the window and then woke up on the floor, she is unsure if she had head injury and loss consciousness.  She does not have a current headache, reports some pain to her knees and to her low back.  She reports she last took her Eliquis this morning.  Denies any headache, chest pain, palpitations or dyspnea.  No numbness or tingling to extremities.  Patient is alert oriented x 4.   ED Course:  At presentation to ED patient heart rate 110, respiratory 16, blood pressure soft 98/58 and O2 sat 100% room air. CBC showing leukocytosis 26.8, hemoglobin 9, hematocrit 28, low MCV 66, and platelet count 276. CMP showing low sodium 132, potassium 4, low chloride 97, low bicarb 17, blood glucose 158, BUN 39, elevated creatinine 2.35 (baseline creatinine around 1), low albumin 2.9, mild transaminitis, elevated bilirubin 1.3, GFR 31 and anion gap 18. CK2 221 WNL. High  sensitive troponin 51.  EK showing sinus tachycardia heart rate 119. Elevated lactic acid 2.4. Blood cultures has been obtained in the ED.  Pending result.  Extensive imaging included chest x-ray unremarkable.  CT cervical spine, lumbar spine, and thoracic spine mild degenerative change without any fracture.  X-ray of bilateral knee joints degenerative change.  No fracture.  CT chest abdomen: IMPRESSION: 1. No acute intrathoracic or intra-abdominal injury. 2. Mild nodular ground-glass pulmonary infiltrate within the right apex, possibly infectious or inflammatory in nature. 3. 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 No follow-up imaging is recommended for this lesion. 4. Mild to moderate pancolonic diverticulosis, most severe within the sigmoid colon.  CT head: No acute intracranial hemorrhage.  Due to elevated lactic acid, leukocytosis evidence for pneumonia code sepsis has been activated in the ED.  Patient received 2 L of LR bolus and currently on LR 150 cc/h.  Also received azithromycin and ceftriaxone.   Hospitalist has been contacted for further evaluation management of acute metabolic encephalopathy (already improving), possible syncopal episode, mechanical fall, acute kidney injury, sepsis and pneumonia.  Review of Systems:  Review of Systems  Constitutional:  Negative for chills, fever, malaise/fatigue and weight loss.  Eyes:  Negative for blurred vision and double vision.  Respiratory:  Negative for cough, sputum production, shortness of breath and wheezing.   Cardiovascular:  Positive for leg swelling. Negative for chest pain and palpitations.  Gastrointestinal:  Negative for abdominal pain, heartburn, nausea and vomiting.  Genitourinary:  Negative for dysuria, frequency and urgency.  Musculoskeletal:  Negative for back pain, joint pain, myalgias and neck pain.  Neurological:  Negative for dizziness, seizures, loss of consciousness and headaches.  Endo/Heme/Allergies:  Does  not bruise/bleed easily.  Psychiatric/Behavioral:  The patient is not nervous/anxious.     Past Medical History:  Diagnosis Date   Chronic kidney disease    Diabetes mellitus without complication (HCC)    borderline   DVT (deep venous thrombosis) (HCC)    Hepatitis    b   Hypertension     History reviewed. No pertinent surgical history.   reports that she has never smoked. She does not have any smokeless tobacco history on file. She reports that she does not drink alcohol and does not use drugs.  Allergies  Allergen Reactions   Tramadol     States it caused bruises on her body   Lisinopril Cough    History reviewed. No pertinent family history.  Prior to Admission medications   Medication Sig Start Date End Date Taking? Authorizing Provider  apixaban (ELIQUIS) 5 MG TABS tablet Take 1 tablet (5 mg total) by mouth 2 (two) times daily. Start taking after completion of starter pack. 06/08/22   Cephus Shelling, MD  APIXABAN Everlene Balls) VTE STARTER PACK (10MG  AND 5MG ) Take as directed on package: start with two-5mg  tablets twice daily for 7 days. On day 8, switch to one-5mg  tablet twice daily. Patient not taking: Reported on 10/05/2022 06/08/22   Cephus Shelling, MD  atorvastatin (LIPITOR) 40 MG tablet Take 40 mg by mouth daily. 03/14/22   [provider]  carboxymethylcellulose (REFRESH PLUS) 0.5 % SOLN Place 1 drop into both eyes in the morning and at bedtime.    [provider]  Cholecalciferol (VITAMIN D-1000 MAX ST) 25 MCG (1000 UT) tablet Take 1,000 Units by mouth daily. 08/24/13   [provider]  ferrous sulfate 325 (65 FE) MG EC tablet Take 325 mg by mouth daily.    [provider]  Flaxseed, Linseed, (FLAXSEED OIL PO) Take 1 capsule by mouth daily.    [provider]  losartan (COZAAR) 25 MG tablet Take 25 mg by mouth daily.    [provider]  Multiple Vitamins-Minerals (CENTRUM SILVER PO) Take 1 tablet by mouth  daily.    [provider]  spironolactone-hydrochlorothiazide (ALDACTAZIDE) 25-25 MG tablet Take 1 tablet by mouth daily.    [provider]     Physical Exam: Vitals:   11/01/22 1915 11/01/22 1925 11/01/22 1950 11/01/22 2015  BP: (!) 104/59 (!) 107/57 108/61 (!) 110/48  Pulse:    (!) 102  Resp: (!) 25 (!) 25 (!) 26 17  Temp: 98.1 F (36.7 C)     TempSrc: Oral     SpO2: 100%   100%  Weight:      Height:        Physical Exam Constitutional:      General: She is not in acute distress.    Appearance: She is ill-appearing.  HENT:     Head: Normocephalic.     Mouth/Throat:     Mouth: Mucous membranes are dry.  Eyes:     Pupils: Pupils are equal, round, and reactive to light.  Cardiovascular:     Rate and Rhythm: Normal rate and regular rhythm.     Pulses: Normal pulses.  Pulmonary:     Effort: Pulmonary effort is normal.     Breath sounds: Normal breath sounds.  Abdominal:  General: Bowel sounds are normal. There is no distension.  Musculoskeletal:        General: Swelling present.     Cervical back: Neck supple.     Right lower leg: No edema.     Left lower leg: No edema.  Skin:    Capillary Refill: Capillary refill takes less than 2 seconds.  Neurological:     Mental Status: She is oriented to person, place, and time.     Motor: No weakness.  Psychiatric:        Mood and Affect: Mood normal.      Labs on Admission: I have personally reviewed following labs and imaging studies  CBC: Recent Labs  Lab 11/01/22 1915 11/01/22 1921  WBC 26.8*  --   NEUTROABS 24.4*  --   HGB 9.0* 10.5*  HCT 28.7* 31.0*  MCV 66.3*  --   PLT 276  --    Basic Metabolic Panel: Recent Labs  Lab 11/01/22 1915 11/01/22 1921  NA 132* 133*  K 4.0 4.1  CL 97* 102  CO2 17*  --   GLUCOSE 158* 161*  BUN 39* 36*  CREATININE 2.35* 2.40*  CALCIUM 9.2  --    GFR: Estimated Creatinine Clearance: 21.8 mL/min (A) (by C-G formula based on SCr of 2.4 mg/dL  (H)). Liver Function Tests: Recent Labs  Lab 11/01/22 1915  AST 61*  ALT 74*  ALKPHOS 64  BILITOT 1.3*  PROT 7.0  ALBUMIN 2.9*   Recent Labs  Lab 11/01/22 1915  LIPASE 43   No results for input(s): "AMMONIA" in the last 168 hours. Coagulation Profile: Recent Labs  Lab 11/01/22 1915  INR 2.0*   Cardiac Enzymes: Recent Labs  Lab 11/01/22 1915  CKTOTAL 221  TROPONINIHS 51*   BNP (last 3 results) No results for input(s): "BNP" in the last 8760 hours. HbA1C: No results for input(s): "HGBA1C" in the last 72 hours. CBG: No results for input(s): "GLUCAP" in the last 168 hours. Lipid Profile: No results for input(s): "CHOL", "HDL", "LDLCALC", "TRIG", "CHOLHDL", "LDLDIRECT" in the last 72 hours. Thyroid Function Tests: No results for input(s): "TSH", "T4TOTAL", "FREET4", "T3FREE", "THYROIDAB" in the last 72 hours. Anemia Panel: No results for input(s): "VITAMINB12", "FOLATE", "FERRITIN", "TIBC", "IRON", "RETICCTPCT" in the last 72 hours. Urine analysis: No results found for: "COLORURINE", "APPEARANCEUR", "LABSPEC", "PHURINE", "GLUCOSEU", "HGBUR", "BILIRUBINUR", "KETONESUR", "PROTEINUR", "UROBILINOGEN", "NITRITE", "LEUKOCYTESUR"  Radiological Exams on Admission: I have personally reviewed images DG Knee Complete 4 Views Right  Result Date: 11/01/2022 CLINICAL DATA:  Recent fall with knee pain, initial encounter EXAM: RIGHT KNEE - COMPLETE 4+ VIEW COMPARISON:  None Available. FINDINGS: Tricompartmental degenerative changes are noted. No joint effusion is seen. No acute fracture or dislocation is noted. No soft tissue changes are seen. IMPRESSION: Degenerative change without acute abnormality. Electronically Signed   By: Alcide Clever M.D.   On: 11/01/2022 21:47   DG Knee Complete 4 Views Left  Result Date: 11/01/2022 CLINICAL DATA:  Recent fall with knee pain, initial encounter EXAM: LEFT KNEE - COMPLETE 4+ VIEW COMPARISON:  None Available. FINDINGS: Tricompartmental  degenerative changes are noted. No joint effusion is seen. No acute fracture or dislocation is noted. No soft tissue abnormality is seen. IMPRESSION: Degenerative change without acute abnormality. Electronically Signed   By: Alcide Clever M.D.   On: 11/01/2022 21:46   CT CHEST ABDOMEN PELVIS WO CONTRAST  Result Date: 11/01/2022 CLINICAL DATA:  Blunt poly trauma, found down, altered mental status EXAM: CT CHEST, ABDOMEN  AND PELVIS WITHOUT CONTRAST TECHNIQUE: Multidetector CT imaging of the chest, abdomen and pelvis was performed following the standard protocol without IV contrast. RADIATION DOSE REDUCTION: This exam was performed according to the departmental dose-optimization program which includes automated exposure control, adjustment of the mA and/or kV according to patient size and/or use of iterative reconstruction technique. COMPARISON:  None Available. FINDINGS: CT CHEST FINDINGS Cardiovascular: No significant coronary artery calcification. Global cardiac size within normal limits. No pericardial effusion. Central pulmonary arteries are of normal caliber. Mild atherosclerotic calcification within the thoracic aorta. No aortic aneurysm. Mediastinum/Nodes: No enlarged mediastinal, hilar, or axillary lymph nodes. Thyroid gland, trachea, and esophagus demonstrate no significant findings. Lungs/Pleura: Mild nodular ground-glass pulmonary infiltrate within the right apex is nonspecific, possibly infectious or inflammatory in nature. Mild superimposed right upper lobe atelectasis or scarring. No focal consolidation. No pneumothorax or pleural effusion. No central obstructing lesion. Musculoskeletal: Osseous structures are age-appropriate. No acute bone abnormality. No lytic or blastic bone lesion CT ABDOMEN PELVIS FINDINGS Hepatobiliary: No focal liver abnormality is seen. No gallstones, gallbladder wall thickening, or biliary dilatation. Pancreas: Unremarkable Spleen: Unremarkable Adrenals/Urinary Tract: 2.6 cm  nodule within the left adrenal gland demonstrates a mean density of 8 Hounsfield units and is compatible with a benign adrenal adenoma. No follow-up imaging is recommended for this lesion. The right adrenal gland is unremarkable. The kidneys are unremarkable on this noncontrast examination. Bladder unremarkable. Stomach/Bowel: Mild to moderate pancolonic diverticulosis, most severe within the sigmoid colon. The stomach, small bowel, and large bowel are otherwise unremarkable. No evidence of obstruction or focal inflammation. No free intraperitoneal gas or fluid. The appendix is normal. Vascular/Lymphatic: Aortic atherosclerosis. No enlarged abdominal or pelvic lymph nodes. Reproductive: Uterus and bilateral adnexa are unremarkable. Other: No abdominal wall hernia Musculoskeletal: Degenerative changes are seen throughout the lumbar spine. No acute bone abnormality. No lytic or blastic bone lesion are identified. IMPRESSION: 1. No acute intrathoracic or intra-abdominal injury. 2. Mild nodular ground-glass pulmonary infiltrate within the right apex, possibly infectious or inflammatory in nature. 3. 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 No follow-up imaging is recommended for this lesion. 4. Mild to moderate pancolonic diverticulosis, most severe within the sigmoid colon. Aortic Atherosclerosis (ICD10-I70.0). Electronically Signed   By: Helyn Numbers M.D.   On: 11/01/2022 20:46   CT HEAD WO CONTRAST ( )  Result Date: 11/01/2022 CLINICAL DATA:  Blunt poly trauma, found down, altered mental status EXAM: CT HEAD WITHOUT CONTRAST TECHNIQUE: Contiguous axial images were obtained from the base of the skull through the vertex without intravenous contrast. RADIATION DOSE REDUCTION: This exam was performed according to the departmental dose-optimization program which includes automated exposure control, adjustment of the mA and/or kV according to patient size and/or use of iterative reconstruction technique. COMPARISON:   None Available. FINDINGS: Brain: Normal anatomic configuration. Parenchymal volume loss is commensurate with the patient's age. Mild subcortical and periventricular white matter changes are present likely reflecting the sequela of small vessel ischemia. No abnormal intra or extra-axial mass lesion or fluid collection. No abnormal mass effect or midline shift. No evidence of acute intracranial hemorrhage or infarct. Ventricular size is normal. Cerebellum unremarkable. Vascular: No asymmetric hyperdense vasculature at the skull base. Skull: Intact Sinuses/Orbits: Paranasal sinuses are clear. Orbits are unremarkable. Other: Mastoid air cells and middle ear cavities are clear. IMPRESSION: 1. No acute intracranial hemorrhage or infarct. No calvarial fracture. 2. Mild senescent change. Electronically Signed   By: Helyn Numbers M.D.   On: 11/01/2022 20:40   CT Cervical  Spine Wo Contrast  Result Date: 11/01/2022 CLINICAL DATA:  Neck trauma.  Patient was found down, unknown time. EXAM: CT CERVICAL SPINE WITHOUT CONTRAST TECHNIQUE: Multidetector CT imaging of the cervical spine was performed without intravenous contrast. Multiplanar CT image reconstructions were also generated. RADIATION DOSE REDUCTION: This exam was performed according to the departmental dose-optimization program which includes automated exposure control, adjustment of the mA and/or kV according to patient size and/or use of iterative reconstruction technique. COMPARISON:  None Available. FINDINGS: Alignment: Normal alignment. Skull base and vertebrae: Skull base appears intact. No vertebral compression deformities. No focal bone lesion or bone destruction. Bone cortex appears intact. Soft tissues and spinal canal: No prevertebral soft tissue swelling. No abnormal paraspinal soft tissue mass or infiltration. Disc levels: Degenerative changes throughout the cervical spine with narrowed interspaces and endplate osteophyte formation. Degenerative changes  in the facet joints and at C1-2. Degenerative changes in the temporomandibular joints. Uncovertebral and facet joint spurring causes bone encroachment upon neural foramina at multiple levels bilaterally. Upper chest: Mild patchy infiltrates in the right upper lung, possibly pneumonia. Other: None. IMPRESSION: 1. Normal alignment.  No acute displaced fractures identified. 2. Prominent degenerative changes throughout the cervical spine. 3. Mild patchy infiltrates are suggested in the right upper lung, possibly pneumonia. Electronically Signed   By: Burman Nieves M.D.   On: 11/01/2022 20:39   CT T-SPINE NO CHARGE  Result Date: 11/01/2022 CLINICAL DATA:  Unwitnessed fall, pain. EXAM: CT THORACIC SPINE WITHOUT CONTRAST TECHNIQUE: Multidetector CT images of the thoracic were obtained using the standard protocol without intravenous contrast. RADIATION DOSE REDUCTION: This exam was performed according to the departmental dose-optimization program which includes automated exposure control, adjustment of the mA and/or kV according to patient size and/or use of iterative reconstruction technique. COMPARISON:  None Available. FINDINGS: Alignment: Mild dextroscoliosis. Vertebrae: No acute fracture. The vertebral body heights are normal. The posterior elements are intact. Paraspinal and other soft tissues: Assessed fully on concurrent chest CT, reported separately. Disc levels: Mild diffuse disc space narrowing and anterior spurring. No high-grade canal stenosis. IMPRESSION: Mild degenerative change of the thoracic spine. No acute fracture. Electronically Signed   By: Narda Rutherford M.D.   On: 11/01/2022 20:38   CT L-SPINE NO CHARGE  Result Date: 11/01/2022 CLINICAL DATA:  Unwitnessed fall.  Pain. EXAM: CT LUMBAR SPINE WITHOUT CONTRAST TECHNIQUE: Multidetector CT imaging of the lumbar spine was performed without intravenous contrast administration. Multiplanar CT image reconstructions were also generated. RADIATION  DOSE REDUCTION: This exam was performed according to the departmental dose-optimization program which includes automated exposure control, adjustment of the mA and/or kV according to patient size and/or use of iterative reconstruction technique. COMPARISON:  None Available. FINDINGS: Segmentation: 5 lumbar type vertebrae. Alignment: Mild dextroscoliotic curvature.  No listhesis. Vertebrae: No acute fracture. Vertebral body heights are preserved. The posterior elements are intact. Paraspinal and other soft tissues: Assessed on concurrent abdominopelvic CT, reported separately. Disc levels: Advanced multilevel degenerative disc disease and facet hypertrophy with diffuse disc space narrowing, spurring, and vacuum phenomenon. Modic endplate changes at the L3-L4 level. Spinal canal stenosis at L3-L4 and L4-L5. There is multilevel neural foraminal stenosis. IMPRESSION: 1. No acute fracture or subluxation of the lumbar spine. 2. 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 Electronically Signed   By: Narda Rutherford M.D.   On: 11/01/2022 20:36   DG Chest Portable 1 View  Result Date: 11/01/2022 CLINICAL DATA:  Fall EXAM: PORTABLE CHEST 1 VIEW COMPARISON:  Chest x-ray 08/22/2014  FINDINGS: The heart size and mediastinal contours are within normal limits. Both lungs are clear. The visualized skeletal structures are unremarkable. IMPRESSION: No active disease. Electronically Signed   By: Darliss Cheney M.D.   On: 11/01/2022 19:58    EKG: My personal interpretation of EKG shows: Sinus tachycardia heart rate 119.  There is no ST and T wave abnormality.    Assessment/Plan: Principal Problem:   Severe sepsis (HCC) Active Problems:   Fall at home, initial encounter   CAP (community acquired pneumonia)   Acute metabolic encephalopathy   CKD (chronic kidney disease) stage 3, GFR 30-59 ml/min (HCC)   DVT (deep venous thrombosis) (HCC)   Hyperlipidemia   Microcytic anemia   Essential  hypertension   AKI (acute kidney injury) (HCC)   Elevated troponin   Non-insulin treated type 2 diabetes mellitus (HCC)   High anion gap metabolic acidosis    Assessment and Plan: Community-acquired pneumonia Severe sepsis secondary to pneumonia -Patient presenting with complaining of fall and questionable syncope.  She initially has altered mentation which has been completely resolved.  Is found low blood pressure 70/40 which has been improved to upper 90s with IV fluid resuscitation. - CBC showing leukocytosis 26.8.  Tachycardic 110, hypotensive 99/58, elevated lactic acid 2.4 and CT chest showing evidence of pneumonia.  Patient meets sepsis criteria. -Status post 2 L of LR bolus in the ED.  Continue maintenance fluid LR 150 cc/h for 1 day. - In the ED patient received ceftriaxone and azithromycin. - Continue IV ceftriaxone and doxycycline and will follow-up with culture results. -Pending blood cultures, respiratory panel, sputum culture, urine Legionella and urine strep antigen test.  Pending nasal MRSA panel. -Pending repeat lactic acid and procalcitonin level. - Continue monitor fever curve WBC. -Continue aspiration precaution and elevate head bed>30  Mechanical fall Acute metabolic encephalopathy-resolved Questionable syncope -Initially patient was found confused at home when she had a fall and unknown downtime.  Patient also found hypotensive.  Unclear about syncopal episode or not. - Currently patient is alert oriented x 4.  Acute metabolic encephalopathy which has been resolved however it was initially in the setting of sepsis. - Checking orthostatic vital. - Head CT unremarkable for any acute intracranial finding.  CT cervical, lumbar and thoracic spine no evidence of fracture. - CK level within normal range. -Checking echocardiogram to rule out any cardiac abnormality which can cause syncope.  Patient also benefit from [facility] patch placement on discharge. - Continue cardiac  monitoring. - Continue fall precaution. -Consulted PT and OT for evaluation.  Acute kidney injury on CKD 3B -Creatinine 2.4.  Baseline creatinine is around 1 and GFR between 1-45. - Acute kidney injury in the setting of hypotension. - Currently on LR 150 cc/h for 20 hours. - Monitor urine output, renal function and avoid nephrotoxic agent. -Holding losartan, Aldactone and hydrochlorothiazide.  Elevated troponin secondary to demand ischemia from sepsis -High sensitive troponin 51.  EKG unremarkable except tachycardia.  Patient denies any chest pressure and chest pain.  Elevated troponin in the setting of demand ischemia from sepsis.  Continue to trend troponin.  Continue cardiac monitoring  Microcytic anemia Chronic anemia CKD -Hemoglobin dropped 10-9 in the span of 3 months.  Low MCV 66.3.  Patient does not have any active GI bleed and no history of noticing blood in the stool.   Checking anemia panel.  Continue oral iron supplement.  DVT of lower extremity -Continue Eliquis 5 mg twice daily.  Last dose today morning 10/24.  Essential hypertension -Currently hypotensive in the setting of sepsis.  Holding losartan, Aldactone and hydrochlorothiazide.  Non-insulin-dependent DM type II -A1c 6.5.  Not on any oral regimen at this time.  Diet controlled. - Continue carb modified diet. poc blood glucose check with SSI and at bedtime insulin as needed coverage.  Hyperlipidemia Transaminitis -Elevated AST 61 and ALT 74 and elevated bilirubin 1.3 in the setting of hypotensio and sepsis -Holding Lipitor in the setting of transaminitis.  Will follow-up with liver function panel once stabilized can resume Lipitor.  Anion gap metabolic acidosis due to AKI - Low bicarb 17 and elevated anion gap 18.  Anion gap AMA due to AKI.  Continue to monitor.  Treating for AKI.   Mild hyponatremia - Low sodium 132 and low chloride 97.  On physical exam patient has bilateral lower extremity swelling.   Patient denies any shortness of breath and cough.  Patient seems volume overloaded however in the setting of sepsis holding oral blood pressure regimen.   DVT prophylaxis:  Eliquis Code Status:  Full Code.  Discussed with patient's daughter at the bedside. Diet: Heart healthy/carb modified Family Communication:  Family was present at bedside, at the time of interview.  Opportunity was given to ask question and all questions were answered satisfactorily.  Disposition Plan: Tentative discharge to home with home PT OT therapy versus SNF placement next 2 to 3 days Consults: PT and OT Admission status:   Inpatient, progressive  Severity of Illness: The appropriate patient status for this patient is INPATIENT. Inpatient status is judged to be reasonable and necessary in order to provide the required intensity of service to ensure the patient's safety. The patient's presenting symptoms, physical exam findings, and initial radiographic and laboratory data in the context of their chronic comorbidities is felt to place them at high risk for further clinical deterioration. Furthermore, it is not anticipated that the patient will be medically stable for discharge from the hospital within 2 midnights of admission.   * I certify that at the point of admission it is my clinical judgment that the patient will require inpatient hospital care spanning beyond 2 midnights from the point of admission due to high intensity of service, high risk for further deterioration and high frequency of surveillance required.Marland Kitchen    Tereasa Coop, MD [facility]  How to contact the [facility] Attending or Consulting provider 7A - 7P or covering provider during after hours 7P -7A, for this patient.  Check the care team in [facility] and look for a) attending/consulting TRH provider listed and b) the [facility] team listed Log into [email or web address] and use [place]'s universal password to access. If you do not have the password, please  contact the hospital operator. Locate the [facility] provider you are looking for under [facility] and page to a number that you can be directly reached. If you still have difficulty reaching the provider, please page the [facility] (Director on Call) for the Hospitalists listed on amion for assistance.  11/01/2022, 9:53 PM

## 2022-11-01 NOTE — ED Notes (Signed)
Patient transported to CT with TRN on monitor

## 2022-11-01 NOTE — Progress Notes (Signed)
Trauma Response Nurse Documentation   Tina Bryant is a 77 y.o. female arriving to [facility] ED via EMS  On Eliquis (apixaban) daily. Trauma was activated as a Level 2 by EDP based on the following trauma criteria Elderly patients > 65 with head trauma on anti-coagulation (excluding ASA).  Patient cleared for CT by Dr. Wallace Cullens EDP. Pt transported to CT with trauma response nurse present to monitor. RN remained with the patient throughout their absence from the department for clinical observation.   GCS 14.  History   Past Medical History:  Diagnosis Date   Chronic kidney disease    Diabetes mellitus without complication (HCC)    borderline   DVT (deep venous thrombosis) (HCC)    Hepatitis    b   Hypertension      History reviewed. No pertinent surgical history.     Initial Focused Assessment (If applicable, or please see trauma documentation): Alert female presents from home following a fall with unknown downtime, last seen on Monday. Police sent for welfare chest and found her down. No obvious trauma noted, pt complaining of chronic back pain  Airway patent/unobstructed, BS clear No obvious uncontrolled hemorrhage GCS 14 PERRLA sluggish 3  CT's Completed:   CT Head and CT C-Spine  CT chest/abd/pelvis w/o contrast CT T/L spine  Interventions:  IV start and trauma lab draw Portable chest XRAY CT head, C/T/L spine, chest/abd/pelvis w/o contrast EKG LR bolus CAGE AID  Plan for disposition:  Admit  Consults completed:  Consult to hospitalist  Event Summary: Presents following a fall after being found down by police at home. Unknown downtime. No obvious trauma noted. C/o chronic back pain. Alert and appropriate, but unable to elaborate on the context of the fall. Workup significant for PNA. Admit to hospitalist.  MTP Summary (If applicable): NA  Bedside handoff with ED RN Meriam Sprague  Trauma Response RN  Please call TRN at [phone] for  further assistance.

## 2022-11-01 NOTE — TOC CAGE-AID Note (Signed)
Transition of Care [facility]) - CAGE-AID Screening   Patient Details  Name: Tina Bryant MRN: [id number] Date of Birth: 07-21-1945  Transition of [facility]) CM/SW Contact:    Katha Hamming, RN Phone Number: 11/01/2022, 9:43 PM     CAGE-AID Screening:    Have You Ever Felt You Ought to Cut Down on Your Drinking or Drug Use?: No Have People Annoyed You By [employer] Your Drinking Or Drug Use?: No Have You Felt Bad Or Guilty About Your Drinking Or Drug Use?: No Have You Ever Had a Drink or Used Drugs First Thing In The Morning to Steady Your Nerves or to Get Rid of a Hangover?: No CAGE-AID Score: 0  Substance Abuse Education Offered: No

## 2022-11-02 ENCOUNTER — Inpatient Hospital Stay (HOSPITAL_COMMUNITY): Payer: Medicaid Other

## 2022-11-02 DIAGNOSIS — W19XXXA Unspecified fall, initial encounter: Secondary | ICD-10-CM

## 2022-11-02 DIAGNOSIS — Y92009 Unspecified place in unspecified non-institutional (private) residence as the place of occurrence of the external cause: Secondary | ICD-10-CM

## 2022-11-02 DIAGNOSIS — R55 Syncope and collapse: Secondary | ICD-10-CM

## 2022-11-02 DIAGNOSIS — R4182 Altered mental status, unspecified: Secondary | ICD-10-CM | POA: Diagnosis not present

## 2022-11-02 DIAGNOSIS — N179 Acute kidney failure, unspecified: Secondary | ICD-10-CM | POA: Diagnosis not present

## 2022-11-02 DIAGNOSIS — A419 Sepsis, unspecified organism: Secondary | ICD-10-CM | POA: Diagnosis not present

## 2022-11-02 DIAGNOSIS — G9341 Metabolic encephalopathy: Secondary | ICD-10-CM | POA: Diagnosis not present

## 2022-11-02 LAB — URINALYSIS, ROUTINE W REFLEX MICROSCOPIC
Bilirubin Urine: NEGATIVE
Glucose, UA: NEGATIVE mg/dL
Ketones, ur: NEGATIVE mg/dL
Nitrite: NEGATIVE
Protein, ur: NEGATIVE mg/dL
Specific Gravity, Urine: 1.009 (ref 1.005–1.030)
pH: 6 (ref 5.0–8.0)

## 2022-11-02 LAB — BLOOD CULTURE ID PANEL (REFLEXED) - BCID2

## 2022-11-02 LAB — ECHOCARDIOGRAM COMPLETE
Area-P 1/2: 4.86 cm2
Height: 66 in
MV M vel: 5.87 m/s
MV Peak grad: 137.8 mm[Hg]
S' Lateral: 1.9 cm
Weight: 3168 [oz_av]

## 2022-11-02 LAB — RESPIRATORY PANEL BY PCR

## 2022-11-02 LAB — PATHOLOGIST SMEAR REVIEW

## 2022-11-02 LAB — CBC
HCT: 26.3 % — ABNORMAL LOW (ref 36.0–46.0)
Hemoglobin: 8.5 g/dL — ABNORMAL LOW (ref 12.0–15.0)
MCH: 20.7 pg — ABNORMAL LOW (ref 26.0–34.0)
MCHC: 32.3 g/dL (ref 30.0–36.0)
MCV: 64 fL — ABNORMAL LOW (ref 80.0–100.0)
Platelets: 286 10*3/uL (ref 150–400)
RBC: 4.11 MIL/uL (ref 3.87–5.11)
RDW: 16.5 % — ABNORMAL HIGH (ref 11.5–15.5)
WBC: 20 10*3/uL — ABNORMAL HIGH (ref 4.0–10.5)
nRBC: 0 % (ref 0.0–0.2)

## 2022-11-02 LAB — COMPREHENSIVE METABOLIC PANEL
ALT: 67 U/L — ABNORMAL HIGH (ref 0–44)
AST: 55 U/L — ABNORMAL HIGH (ref 15–41)
Albumin: 2.4 g/dL — ABNORMAL LOW (ref 3.5–5.0)
Alkaline Phosphatase: 65 U/L (ref 38–126)
Anion gap: 10 (ref 5–15)
BUN: 30 mg/dL — ABNORMAL HIGH (ref 8–23)
CO2: 22 mmol/L (ref 22–32)
Calcium: 8.7 mg/dL — ABNORMAL LOW (ref 8.9–10.3)
Chloride: 103 mmol/L (ref 98–111)
Creatinine, Ser: 1.52 mg/dL — ABNORMAL HIGH (ref 0.44–1.00)
GFR, Estimated: 35 mL/min — ABNORMAL LOW (ref >60)
Glucose, Bld: 147 mg/dL — ABNORMAL HIGH (ref 70–99)
Potassium: 3.9 mmol/L (ref 3.5–5.1)
Sodium: 135 mmol/L (ref 135–145)
Total Bilirubin: 0.6 mg/dL (ref 0.3–1.2)
Total Protein: 6.3 g/dL — ABNORMAL LOW (ref 6.5–8.1)

## 2022-11-02 LAB — CBG MONITORING, ED
Glucose-Capillary: 118 mg/dL — ABNORMAL HIGH (ref 70–99)
Glucose-Capillary: 122 mg/dL — ABNORMAL HIGH (ref 70–99)
Glucose-Capillary: 137 mg/dL — ABNORMAL HIGH (ref 70–99)

## 2022-11-02 LAB — GLUCOSE, CAPILLARY
Glucose-Capillary: 142 mg/dL — ABNORMAL HIGH (ref 70–99)
Glucose-Capillary: 117 mg/dL — ABNORMAL HIGH (ref 70–99)

## 2022-11-02 LAB — I-STAT CG4 LACTIC ACID, ED: Lactic Acid, Venous: 0.6 mmol/L (ref 0.5–1.9)

## 2022-11-02 LAB — VITAMIN B12: Vitamin B-12: 3691 pg/mL — ABNORMAL HIGH (ref 180–914)

## 2022-11-02 LAB — FOLATE: Folate: 18.2 ng/mL (ref >5.9)

## 2022-11-02 LAB — TROPONIN I (HIGH SENSITIVITY)
Troponin I (High Sensitivity): 162 ng/L (ref <18)
Troponin I (High Sensitivity): 83 ng/L — ABNORMAL HIGH (ref <18)

## 2022-11-02 LAB — PROCALCITONIN: Procalcitonin: 6.04 ng/mL

## 2022-11-02 LAB — STREP PNEUMONIAE URINARY ANTIGEN: Strep Pneumo Urinary Antigen: NEGATIVE

## 2022-11-02 MED ORDER — OXYCODONE-ACETAMINOPHEN 5-325 MG PO TABS
1.0000 | ORAL_TABLET | Freq: Once | ORAL | Status: AC
  Administered 2022-11-02: 1 via ORAL
  Filled 2022-11-02: qty 1

## 2022-11-02 MED ORDER — SODIUM CHLORIDE 0.9 % IV SOLN
1.0000 g | Freq: Two times a day (BID) | INTRAVENOUS | Status: DC
  Administered 2022-11-02 – 2022-11-05 (×6): 1 g via INTRAVENOUS
  Filled 2022-11-02 (×6): qty 20

## 2022-11-02 NOTE — Progress Notes (Signed)
PHARMACY - PHYSICIAN COMMUNICATION CRITICAL VALUE ALERT - BLOOD CULTURE IDENTIFICATION (BCID)  Tina Bryant is an 77 y.o. female who presented to [facility] on 11/01/2022 with a chief complaint of fall and altered mental status. Patient was then diagnosed with severe sepsis and pneumonia.   Assessment:  BCID blood cultures positive for E Coli CTXM resistant on 10/25. Patient was on ceftriaxone at that time. MD agreed to change therapy to meropenem.   Name of physician (or Provider) Contacted: Dr. Mauro Kaufmann  Current antibiotics: Ceftriaxone, doxycycline   Changes to prescribed antibiotics recommended:  Ceftriaxone will be changed to meropenem 1 gram q12h.   Results for orders placed or performed during the hospital encounter of 11/01/22  Blood Culture ID Panel (Reflexed) (Collected: 11/01/2022  7:35 PM)  Result Value Ref Range   Enterococcus faecalis NOT DETECTED NOT DETECTED   Enterococcus Faecium NOT DETECTED NOT DETECTED   Listeria monocytogenes NOT DETECTED NOT DETECTED   Staphylococcus species NOT DETECTED NOT DETECTED   Staphylococcus aureus (BCID) NOT DETECTED NOT DETECTED   Staphylococcus epidermidis NOT DETECTED NOT DETECTED   Staphylococcus lugdunensis NOT DETECTED NOT DETECTED   Streptococcus species NOT DETECTED NOT DETECTED   Streptococcus agalactiae NOT DETECTED NOT DETECTED   Streptococcus pneumoniae NOT DETECTED NOT DETECTED   Streptococcus pyogenes NOT DETECTED NOT DETECTED   A.calcoaceticus-baumannii NOT DETECTED NOT DETECTED   Bacteroides fragilis NOT DETECTED NOT DETECTED   Enterobacterales DETECTED (A) NOT DETECTED   Enterobacter cloacae complex NOT DETECTED NOT DETECTED   Escherichia coli DETECTED (A) NOT DETECTED   Klebsiella aerogenes NOT DETECTED NOT DETECTED   Klebsiella oxytoca NOT DETECTED NOT DETECTED   Klebsiella pneumoniae NOT DETECTED NOT DETECTED   Proteus species NOT DETECTED NOT DETECTED   Salmonella species NOT DETECTED NOT DETECTED    Serratia marcescens NOT DETECTED NOT DETECTED   Haemophilus influenzae NOT DETECTED NOT DETECTED   Neisseria meningitidis NOT DETECTED NOT DETECTED   Pseudomonas aeruginosa NOT DETECTED NOT DETECTED   Stenotrophomonas maltophilia NOT DETECTED NOT DETECTED   Candida albicans NOT DETECTED NOT DETECTED   Candida auris NOT DETECTED NOT DETECTED   Candida glabrata NOT DETECTED NOT DETECTED   Candida krusei NOT DETECTED NOT DETECTED   Candida parapsilosis NOT DETECTED NOT DETECTED   Candida tropicalis NOT DETECTED NOT DETECTED   Cryptococcus neoformans/gattii NOT DETECTED NOT DETECTED   CTX-M ESBL DETECTED (A) NOT DETECTED   Carbapenem resistance IMP NOT DETECTED NOT DETECTED   Carbapenem resistance KPC NOT DETECTED NOT DETECTED   Carbapenem resistance NDM NOT DETECTED NOT DETECTED   Carbapenem resist OXA 48 LIKE NOT DETECTED NOT DETECTED   Carbapenem resistance VIM NOT DETECTED NOT DETECTED    Cedric Fishman 11/02/2022  3:07 PM

## 2022-11-02 NOTE — Progress Notes (Signed)
  Echocardiogram 2D Echocardiogram has been performed.  Delcie Roch 11/02/2022, 3:23 PM

## 2022-11-02 NOTE — ED Notes (Signed)
ED TO INPATIENT HANDOFF REPORT  ED Nurse Name and Phone #: Shirlyn Goltz  S Name/Age/Gender Tina Bryant 77 y.o. female Room/Bed: 007C/007C  Code Status   Code Status: Full Code  Home/SNF/Other Home Patient oriented to: self, place, time, and situation Is this baseline? Yes   Triage Complete: Triage complete  Chief Complaint Sepsis [facility]) [A41.9]  Triage Note Patient bib [facility] from home. Patients family saw her last on Monday and sent for a welfare check and fire forced entry in the house and found her in the floor trying to get up. Unknown of downtime, patient doesn't know either. Family reports that she is lethargic compared to her baseline.   Patient unsure of when she fell but states she took eliquis this morning. Denies LOC, has chronic back  pain, and knee pain.    Allergies Allergies  Allergen Reactions   Tramadol     States it caused bruises on her body; she also stated it causes issues with her kidneys   Lisinopril Cough    Level of Care/Admitting Diagnosis ED Disposition     ED Disposition  Admit   Condition  --   Comment  Hospital Area: [facility] [[id number]]  Level of Care: Progressive [102]  Admit to Progressive based on following criteria: Other see comments  Comments: Sepsis in the setting of pneumonia.  Possible syncopal episode  May admit patient to [facility] or [facility] if equivalent level of care is available:: No  Covid Evaluation: Asymptomatic - no recent exposure (last 10 days) testing not required  Diagnosis: Sepsis [facility]) [[id number]]  Admitting Physician: Tereasa Coop [[id number]]  Attending Physician: Tereasa Coop [[id number]]  Certification:: I certify this patient will need inpatient services for at least 2 midnights          B Medical/Surgery History Past Medical History:  Diagnosis Date   Chronic kidney disease    Diabetes mellitus without complication (HCC)    borderline   DVT (deep venous thrombosis)  (HCC)    Hepatitis    b   Hypertension    History reviewed. No pertinent surgical history.   A IV Location/Drains/Wounds Patient Lines/Drains/Airways Status     Active Line/Drains/Airways     Name Placement date Placement time Site Days   Peripheral IV 11/01/22 20 G Anterior;Left Forearm 11/01/22  1906  Forearm  1   Peripheral IV 11/01/22 20 G 1.88" Anterior;Right Forearm 11/01/22  2346  Forearm  1   External Urinary Catheter 11/02/22  0109  --  less than 1            Intake/Output Last 24 hours  Intake/Output Summary (Last 24 hours) at 11/02/2022 1208 Last data filed at 11/02/2022 0117 Gross per 24 hour  Intake 2349.96 ml  Output --  Net 2349.96 ml    Labs/Imaging Results for orders placed or performed during the hospital encounter of 11/01/22 (from the past 48 hour(s))  CBC with Differential     Status: Abnormal   Collection Time: 11/01/22  7:15 PM  Result Value Ref Range   WBC 26.8 (H) 4.0 - 10.5 K/uL   RBC 4.33 3.87 - 5.11 MIL/uL   Hemoglobin 9.0 (L) 12.0 - 15.0 g/dL   HCT 95.6 (L) 21.3 - 08.6 %   MCV 66.3 (L) 80.0 - 100.0 fL   MCH 20.8 (L) 26.0 - 34.0 pg   MCHC 31.4 30.0 - 36.0 g/dL   RDW 57.8 (H) 46.9 - 62.9 %   Platelets 276 150 -  400 K/uL    Comment: REPEATED TO VERIFY   nRBC 0.0 0.0 - 0.2 %   Neutrophils Relative % 91 %   Neutro Abs 24.4 (H) 1.7 - 7.7 K/uL   Lymphocytes Relative 2 %   Lymphs Abs 0.6 (L) 0.7 - 4.0 K/uL   Monocytes Relative 5 %   Monocytes Absolute 1.3 (H) 0.1 - 1.0 K/uL   Eosinophils Relative 0 %   Eosinophils Absolute 0.0 0.0 - 0.5 K/uL   Basophils Relative 0 %   Basophils Absolute 0.1 0.0 - 0.1 K/uL   Immature Granulocytes 2 %   Abs Immature Granulocytes 0.45 (H) 0.00 - 0.07 K/uL   Schistocytes PRESENT    Burr Cells PRESENT     Comment: Performed at [facility], [address] [place], [place] [address]  Comprehensive metabolic panel     Status: Abnormal   Collection Time: 11/01/22  7:15 PM  Result Value Ref Range    Sodium 132 (L) 135 - 145 mmol/L   Potassium 4.0 3.5 - 5.1 mmol/L   Chloride 97 (L) 98 - 111 mmol/L   CO2 17 (L) 22 - 32 mmol/L   Glucose, Bld 158 (H) 70 - 99 mg/dL    Comment: Glucose reference range applies only to samples taken after fasting for at least 8 hours.   BUN 39 (H) 8 - 23 mg/dL   Creatinine, Ser 6.38 (H) 0.44 - 1.00 mg/dL   Calcium 9.2 8.9 - 75.6 mg/dL   Total Protein 7.0 6.5 - 8.1 g/dL   Albumin 2.9 (L) 3.5 - 5.0 g/dL   AST 61 (H) 15 - 41 U/L   ALT 74 (H) 0 - 44 U/L   Alkaline Phosphatase 64 38 - 126 U/L   Total Bilirubin 1.3 (H) 0.3 - 1.2 mg/dL   GFR, Estimated 21 (L) >60 mL/min    Comment: (NOTE) Calculated using the CKD-EPI Creatinine Equation (2021)    Anion gap 18 (H) 5 - 15    Comment: Performed at [facility], [address] [place], [place] [address]  Lipase, blood     Status: None   Collection Time: 11/01/22  7:15 PM  Result Value Ref Range   Lipase 43 11 - 51 U/L    Comment: Performed at [facility], [address] [place], [place] [address]  CK     Status: None   Collection Time: 11/01/22  7:15 PM  Result Value Ref Range   Total CK 221 38 - 234 U/L    Comment: Performed at [facility], [address] [place], [place] [address]  Troponin I (High Sensitivity)     Status: Abnormal   Collection Time: 11/01/22  7:15 PM  Result Value Ref Range   Troponin I (High Sensitivity) 51 (H) <18 ng/L    Comment: (NOTE) Elevated high sensitivity troponin I (hsTnI) values and significant  changes across serial measurements may suggest ACS but many other  chronic and acute conditions are known to elevate hsTnI results.  Refer to the "Links" section for chest pain algorithms and additional  guidance. Performed at [facility], [address], [place], [place] [address]   Protime-INR     Status: Abnormal   Collection Time: 11/01/22  7:15 PM  Result Value Ref Range   Prothrombin Time 23.1 (H) 11.4 - 15.2 seconds   INR 2.0 (H) 0.8 -  1.2    Comment: (NOTE) INR goal varies based on  device and disease states. Performed at [facility], [address], [place], [place] [address]   Iron and TIBC     Status: Abnormal   Collection Time: 11/01/22  7:15 PM  Result Value Ref Range   Iron 17 (L) 28 - 170 ug/dL   TIBC 295 (L) 284 - 132 ug/dL   Saturation Ratios 7 (L) 10.4 - 31.8 %   UIBC 229 ug/dL    Comment: Performed at [facility], [address] [place], [place] [address]  Ferritin     Status: Abnormal   Collection Time: 11/01/22  7:15 PM  Result Value Ref Range   Ferritin 451 (H) 11 - 307 ng/mL    Comment: Performed at [facility], [address], [place], [place] [address]  Reticulocytes     Status: None   Collection Time: 11/01/22  7:15 PM  Result Value Ref Range   Retic Ct Pct 1.1 0.4 - 3.1 %   RBC. 4.31 3.87 - 5.11 MIL/uL   Retic Count, Absolute 48.3 19.0 - 186.0 K/uL   Immature Retic Fract 11.3 2.3 - 15.9 %    Comment: Performed at [facility], [address], [place], [place] [address]  Procalcitonin     Status: None   Collection Time: 11/01/22  7:15 PM  Result Value Ref Range   Procalcitonin 6.04 ng/mL    Comment:        Interpretation: PCT > 2 ng/mL: Systemic infection (sepsis) is likely, unless other causes are known. (NOTE)       Sepsis PCT Algorithm           Lower Respiratory Tract                                      Infection PCT Algorithm    ----------------------------     ----------------------------         PCT < 0.25 ng/mL                PCT < 0.10 ng/mL          Strongly encourage             Strongly discourage   discontinuation of antibiotics    initiation of antibiotics    ----------------------------     -----------------------------       PCT 0.25 - 0.50 ng/mL            PCT 0.10 - 0.25 ng/mL               OR       >80% decrease in PCT            Discourage initiation of                                            antibiotics      Encourage  discontinuation           of antibiotics    ----------------------------     -----------------------------         PCT >= 0.50 ng/mL              PCT 0.26 - 0.50 ng/mL               AND       <  80% decrease in PCT              Encourage initiation of                                             antibiotics       Encourage continuation           of antibiotics    ----------------------------     -----------------------------        PCT >= 0.50 ng/mL                  PCT > 0.50 ng/mL               AND         increase in PCT                  Strongly encourage                                      initiation of antibiotics    Strongly encourage escalation           of antibiotics                                     -----------------------------                                           PCT <= 0.25 ng/mL                                                 OR                                        > 80% decrease in PCT                                      Discontinue / Do not initiate                                             antibiotics  Performed at [facility], [address] [place], [place] [address]   I-stat chem 8, ED (not at [facility], [facility] or [facility])     Status: Abnormal   Collection Time: 11/01/22  7:21 PM  Result Value Ref Range   Sodium 133 (L) 135 - 145 mmol/L   Potassium 4.1 3.5 - 5.1 mmol/L   Chloride 102 98 - 111 mmol/L   BUN 36 (H) 8 - 23 mg/dL   Creatinine, Ser 2.95 (H) 0.44 - 1.00 mg/dL   Glucose, Bld 621 (H) 70 - 99 mg/dL    Comment: Glucose reference range applies only  to samples taken after fasting for at least 8 hours.   Calcium, Ion 1.09 (L) 1.15 - 1.40 mmol/L   TCO2 19 (L) 22 - 32 mmol/L   Hemoglobin 10.5 (L) 12.0 - 15.0 g/dL   HCT 40.9 (L) 81.1 - 91.4 %  I-Stat CG4 Lactic Acid     Status: Abnormal   Collection Time: 11/01/22  7:22 PM  Result Value Ref Range   Lactic Acid, Venous 2.4 (HH) 0.5 - 1.9 mmol/L   Comment NOTIFIED PHYSICIAN   Folate     Status:  None   Collection Time: 11/02/22 12:01 AM  Result Value Ref Range   Folate 18.2 >5.9 ng/mL    Comment: Performed at [facility], [address] [place], [place] [address]  Troponin I (High Sensitivity)     Status: Abnormal   Collection Time: 11/02/22 12:01 AM  Result Value Ref Range   Troponin I (High Sensitivity) 162 (HH) <18 ng/L    Comment: CRITICAL RESULT CALLED TO, READ BACK BY AND VERIFIED WITH A. COBB RN 11/02/22 @0117  BY J. WHITE (NOTE) Elevated high sensitivity troponin I (hsTnI) values and significant  changes across serial measurements may suggest ACS but many other  chronic and acute conditions are known to elevate hsTnI results.  Refer to the "Links" section for chest pain algorithms and additional  guidance. Performed at [facility], [address] [place], [place] [address]   I-Stat CG4 Lactic Acid     Status: None   Collection Time: 11/02/22 12:06 AM  Result Value Ref Range   Lactic Acid, Venous 0.6 0.5 - 1.9 mmol/L  CBG monitoring, ED     Status: Abnormal   Collection Time: 11/02/22 12:36 AM  Result Value Ref Range   Glucose-Capillary 118 (H) 70 - 99 mg/dL    Comment: Glucose reference range applies only to samples taken after fasting for at least 8 hours.  Respiratory (~20 pathogens) panel by PCR     Status: None   Collection Time: 11/02/22  2:10 AM   Specimen: Nasopharyngeal Swab; Respiratory  Result Value Ref Range   Adenovirus NOT DETECTED NOT DETECTED   Coronavirus 229E NOT DETECTED NOT DETECTED    Comment: (NOTE) The Coronavirus on the Respiratory Panel, DOES NOT test for the novel  Coronavirus (2019 nCoV)    Coronavirus HKU1 NOT DETECTED NOT DETECTED   Coronavirus NL63 NOT DETECTED NOT DETECTED   Coronavirus OC43 NOT DETECTED NOT DETECTED   Metapneumovirus NOT DETECTED NOT DETECTED   Rhinovirus / Enterovirus NOT DETECTED NOT DETECTED   Influenza A NOT DETECTED NOT DETECTED   Influenza B NOT DETECTED NOT DETECTED   Parainfluenza  Virus 1 NOT DETECTED NOT DETECTED   Parainfluenza Virus 2 NOT DETECTED NOT DETECTED   Parainfluenza Virus 3 NOT DETECTED NOT DETECTED   Parainfluenza Virus 4 NOT DETECTED NOT DETECTED   Respiratory Syncytial Virus NOT DETECTED NOT DETECTED   Bordetella pertussis NOT DETECTED NOT DETECTED   Bordetella Parapertussis NOT DETECTED NOT DETECTED   Chlamydophila pneumoniae NOT DETECTED NOT DETECTED   Mycoplasma pneumoniae NOT DETECTED NOT DETECTED    Comment: Performed at [facility], [address] [place], [place] [address]  CBG monitoring, ED     Status: Abnormal   Collection Time: 11/02/22 10:18 AM  Result Value Ref Range   Glucose-Capillary 137 (H) 70 - 99 mg/dL    Comment: Glucose reference range applies only to samples taken after fasting for at least 8 hours.  CBG monitoring, ED     Status: Abnormal   Collection Time: 11/02/22 11:53 AM  Result Value Ref Range   Glucose-Capillary 122 (H) 70 - 99 mg/dL    Comment: Glucose reference range applies only to samples taken after fasting for at least 8 hours.   DG Knee Complete 4 Views Right  Result Date: 11/01/2022 CLINICAL DATA:  Recent fall with knee pain, initial encounter EXAM: RIGHT KNEE - COMPLETE 4+ VIEW COMPARISON:  None Available. FINDINGS: Tricompartmental degenerative changes are noted. No joint effusion is seen. No acute fracture or dislocation is noted. No soft tissue changes are seen. IMPRESSION: Degenerative change without acute abnormality. Electronically Signed   By: Alcide Clever M.D.   On: 11/01/2022 21:47   DG Knee Complete 4 Views Left  Result Date: 11/01/2022 CLINICAL DATA:  Recent fall with knee pain, initial encounter EXAM: LEFT KNEE - COMPLETE 4+ VIEW COMPARISON:  None Available. FINDINGS: Tricompartmental degenerative changes are noted. No joint effusion is seen. No acute fracture or dislocation is noted. No soft tissue abnormality is seen. IMPRESSION: Degenerative change without acute abnormality.  Electronically Signed   By: Alcide Clever M.D.   On: 11/01/2022 21:46   CT CHEST ABDOMEN PELVIS WO CONTRAST  Result Date: 11/01/2022 CLINICAL DATA:  Blunt poly trauma, found down, altered mental status EXAM: CT CHEST, ABDOMEN AND PELVIS WITHOUT CONTRAST TECHNIQUE: Multidetector CT imaging of the chest, abdomen and pelvis was performed following the standard protocol without IV contrast. RADIATION DOSE REDUCTION: This exam was performed according to the departmental dose-optimization program which includes automated exposure control, adjustment of the mA and/or kV according to patient size and/or use of iterative reconstruction technique. COMPARISON:  None Available. FINDINGS: CT CHEST FINDINGS Cardiovascular: No significant coronary artery calcification. Global cardiac size within normal limits. No pericardial effusion. Central pulmonary arteries are of normal caliber. Mild atherosclerotic calcification within the thoracic aorta. No aortic aneurysm. Mediastinum/Nodes: No enlarged mediastinal, hilar, or axillary lymph nodes. Thyroid gland, trachea, and esophagus demonstrate no significant findings. Lungs/Pleura: Mild nodular ground-glass pulmonary infiltrate within the right apex is nonspecific, possibly infectious or inflammatory in nature. Mild superimposed right upper lobe atelectasis or scarring. No focal consolidation. No pneumothorax or pleural effusion. No central obstructing lesion. Musculoskeletal: Osseous structures are age-appropriate. No acute bone abnormality. No lytic or blastic bone lesion CT ABDOMEN PELVIS FINDINGS Hepatobiliary: No focal liver abnormality is seen. No gallstones, gallbladder wall thickening, or biliary dilatation. Pancreas: Unremarkable Spleen: Unremarkable Adrenals/Urinary Tract: 2.6 cm nodule within the left adrenal gland demonstrates a mean density of 8 Hounsfield units and is compatible with a benign adrenal adenoma. No follow-up imaging is recommended for this lesion. The  right adrenal gland is unremarkable. The kidneys are unremarkable on this noncontrast examination. Bladder unremarkable. Stomach/Bowel: Mild to moderate pancolonic diverticulosis, most severe within the sigmoid colon. The stomach, small bowel, and large bowel are otherwise unremarkable. No evidence of obstruction or focal inflammation. No free intraperitoneal gas or fluid. The appendix is normal. Vascular/Lymphatic: Aortic atherosclerosis. No enlarged abdominal or pelvic lymph nodes. Reproductive: Uterus and bilateral adnexa are unremarkable. Other: No abdominal wall hernia Musculoskeletal: Degenerative changes are seen throughout the lumbar spine. No acute bone abnormality. No lytic or blastic bone lesion are identified. IMPRESSION: 1. No acute intrathoracic or intra-abdominal injury. 2. Mild nodular ground-glass pulmonary infiltrate within the right apex, possibly infectious or inflammatory in nature. 3. 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 No follow-up imaging is recommended for this lesion. 4. Mild to moderate pancolonic diverticulosis,  most severe within the sigmoid colon. Aortic Atherosclerosis (ICD10-I70.0). Electronically Signed   By: Helyn Numbers M.D.   On: 11/01/2022 20:46   CT HEAD WO CONTRAST ( )  Result Date: 11/01/2022 CLINICAL DATA:  Blunt poly trauma, found down, altered mental status EXAM: CT HEAD WITHOUT CONTRAST TECHNIQUE: Contiguous axial images were obtained from the base of the skull through the vertex without intravenous contrast. RADIATION DOSE REDUCTION: This exam was performed according to the departmental dose-optimization program which includes automated exposure control, adjustment of the mA and/or kV according to patient size and/or use of iterative reconstruction technique. COMPARISON:  None Available. FINDINGS: Brain: Normal anatomic configuration. Parenchymal volume loss is commensurate with the patient's age. Mild subcortical and periventricular white matter changes are  present likely reflecting the sequela of small vessel ischemia. No abnormal intra or extra-axial mass lesion or fluid collection. No abnormal mass effect or midline shift. No evidence of acute intracranial hemorrhage or infarct. Ventricular size is normal. Cerebellum unremarkable. Vascular: No asymmetric hyperdense vasculature at the skull base. Skull: Intact Sinuses/Orbits: Paranasal sinuses are clear. Orbits are unremarkable. Other: Mastoid air cells and middle ear cavities are clear. IMPRESSION: 1. No acute intracranial hemorrhage or infarct. No calvarial fracture. 2. Mild senescent change. Electronically Signed   By: Helyn Numbers M.D.   On: 11/01/2022 20:40   CT Cervical Spine Wo Contrast  Result Date: 11/01/2022 CLINICAL DATA:  Neck trauma.  Patient was found down, unknown time. EXAM: CT CERVICAL SPINE WITHOUT CONTRAST TECHNIQUE: Multidetector CT imaging of the cervical spine was performed without intravenous contrast. Multiplanar CT image reconstructions were also generated. RADIATION DOSE REDUCTION: This exam was performed according to the departmental dose-optimization program which includes automated exposure control, adjustment of the mA and/or kV according to patient size and/or use of iterative reconstruction technique. COMPARISON:  None Available. FINDINGS: Alignment: Normal alignment. Skull base and vertebrae: Skull base appears intact. No vertebral compression deformities. No focal bone lesion or bone destruction. Bone cortex appears intact. Soft tissues and spinal canal: No prevertebral soft tissue swelling. No abnormal paraspinal soft tissue mass or infiltration. Disc levels: Degenerative changes throughout the cervical spine with narrowed interspaces and endplate osteophyte formation. Degenerative changes in the facet joints and at C1-2. Degenerative changes in the temporomandibular joints. Uncovertebral and facet joint spurring causes bone encroachment upon neural foramina at multiple  levels bilaterally. Upper chest: Mild patchy infiltrates in the right upper lung, possibly pneumonia. Other: None. IMPRESSION: 1. Normal alignment.  No acute displaced fractures identified. 2. Prominent degenerative changes throughout the cervical spine. 3. Mild patchy infiltrates are suggested in the right upper lung, possibly pneumonia. Electronically Signed   By: Burman Nieves M.D.   On: 11/01/2022 20:39   CT T-SPINE NO CHARGE  Result Date: 11/01/2022 CLINICAL DATA:  Unwitnessed fall, pain. EXAM: CT THORACIC SPINE WITHOUT CONTRAST TECHNIQUE: Multidetector CT images of the thoracic were obtained using the standard protocol without intravenous contrast. RADIATION DOSE REDUCTION: This exam was performed according to the departmental dose-optimization program which includes automated exposure control, adjustment of the mA and/or kV according to patient size and/or use of iterative reconstruction technique. COMPARISON:  None Available. FINDINGS: Alignment: Mild dextroscoliosis. Vertebrae: No acute fracture. The vertebral body heights are normal. The posterior elements are intact. Paraspinal and other soft tissues: Assessed fully on concurrent chest CT, reported separately. Disc levels: Mild diffuse disc space narrowing and anterior spurring. No high-grade canal stenosis. IMPRESSION: Mild degenerative change of the thoracic spine. No acute fracture. Electronically Signed  By: Narda Rutherford M.D.   On: 11/01/2022 20:38   CT L-SPINE NO CHARGE  Result Date: 11/01/2022 CLINICAL DATA:  Unwitnessed fall.  Pain. EXAM: CT LUMBAR SPINE WITHOUT CONTRAST TECHNIQUE: Multidetector CT imaging of the lumbar spine was performed without intravenous contrast administration. Multiplanar CT image reconstructions were also generated. RADIATION DOSE REDUCTION: This exam was performed according to the departmental dose-optimization program which includes automated exposure control, adjustment of the mA and/or kV according to  patient size and/or use of iterative reconstruction technique. COMPARISON:  None Available. FINDINGS: Segmentation: 5 lumbar type vertebrae. Alignment: Mild dextroscoliotic curvature.  No listhesis. Vertebrae: No acute fracture. Vertebral body heights are preserved. The posterior elements are intact. Paraspinal and other soft tissues: Assessed on concurrent abdominopelvic CT, reported separately. Disc levels: Advanced multilevel degenerative disc disease and facet hypertrophy with diffuse disc space narrowing, spurring, and vacuum phenomenon. Modic endplate changes at the L3-L4 level. Spinal canal stenosis at L3-L4 and L4-L5. There is multilevel neural foraminal stenosis. IMPRESSION: 1. No acute fracture or subluxation of the lumbar spine. 2. 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 Electronically Signed   By: Narda Rutherford M.D.   On: 11/01/2022 20:36   DG Chest Portable 1 View  Result Date: 11/01/2022 CLINICAL DATA:  Fall EXAM: PORTABLE CHEST 1 VIEW COMPARISON:  Chest x-ray 08/22/2014 FINDINGS: The heart size and mediastinal contours are within normal limits. Both lungs are clear. The visualized skeletal structures are unremarkable. IMPRESSION: No active disease. Electronically Signed   By: Darliss Cheney M.D.   On: 11/01/2022 19:58    Pending Labs Unresulted Labs (From admission, onward)     Start     Ordered   11/02/22 0500  CBC  Daily,   R      11/01/22 2154   11/02/22 0500  Comprehensive metabolic panel  Daily,   R      11/01/22 2154   11/01/22 2140  MRSA Next Gen by PCR, Nasal  Once,   R        11/01/22 2139   11/01/22 2135  Vitamin B12  (Anemia Panel (PNL))  Add-on,   AD        11/01/22 2135   11/01/22 2123  Expectorated Sputum Assessment w Gram Stain, Rflx to Resp Cult  (COPD / Pneumonia / Cellulitis / Lower Extremity Wound)  Once,   R        11/01/22 2122   11/01/22 2123  Legionella Pneumophila Serogp 1 Ur Ag  (COPD / Pneumonia / Cellulitis / Lower  Extremity Wound)  Once,   R        11/01/22 2122   11/01/22 2123  Strep pneumoniae urinary antigen  (COPD / Pneumonia / Cellulitis / Lower Extremity Wound)  Once,   R        11/01/22 2122   11/01/22 2054  Blood Culture (routine x 2)  (Septic presentation on arrival (screening labs, nursing and treatment orders for obvious sepsis))  BLOOD CULTURE X 2,   STAT      11/01/22 2055   11/01/22 1915  Pathologist smear review  Once,   R        11/01/22 1915   11/01/22 1854  Urinalysis, Routine w reflex microscopic -Urine, Clean Catch  Once,   URGENT       Question:  Specimen Source  Answer:  Urine, Clean Catch   11/01/22 1853            Vitals/Pain Today's Vitals  11/02/22 0900 11/02/22 0911 11/02/22 1100 11/02/22 1130  BP: (!) 94/34  (!) 100/48 (!) 91/36  Pulse: 100  79 93  Resp: (!) 24  (!) 23 15  Temp:  98.1 F (36.7 C)    TempSrc:  Oral    SpO2: 100%  100% 100%  Weight:      Height:      PainSc:        Isolation Precautions Droplet precaution  Medications Medications  lactated ringers infusion (0 mLs Intravenous Stopped 11/02/22 0719)  cefTRIAXone (ROCEPHIN) 2 g in sodium chloride 0.9 % 100 mL IVPB (0 g Intravenous Stopped 11/02/22 0117)  apixaban (ELIQUIS) tablet 5 mg (5 mg Oral Given 11/02/22 1016)  ferrous sulfate tablet 325 mg (325 mg Oral Given 11/02/22 1016)  doxycycline (VIBRAMYCIN) 100 mg in sodium chloride 0.9 % 250 mL IVPB (has no administration in time range)  sodium chloride flush (NS) 0.9 % injection 3 mL (3 mLs Intravenous Given 11/02/22 1018)  sodium chloride flush (NS) 0.9 % injection 3 mL (has no administration in time range)  0.9 %  sodium chloride infusion (has no administration in time range)  acetaminophen (TYLENOL) tablet 650 mg (has no administration in time range)    Or  acetaminophen (TYLENOL) suppository 650 mg (has no administration in time range)  senna-docusate (Senokot-S) tablet 1 tablet (has no administration in time range)  ondansetron  (ZOFRAN) tablet 4 mg (has no administration in time range)    Or  ondansetron (ZOFRAN) injection 4 mg (has no administration in time range)  insulin aspart (novoLOG) injection 0-6 Units ( Subcutaneous Not Given 11/02/22 1208)  insulin aspart (novoLOG) injection 0-5 Units ( Subcutaneous Not Given 11/02/22 0118)  feeding supplement (ENSURE ENLIVE / ENSURE PLUS) liquid 237 mL (237 mLs Oral Not Given 11/02/22 1050)  lactated ringers bolus 1,000 mL (0 mLs Intravenous Stopped 11/01/22 2358)  lactated ringers bolus 1,000 mL (0 mLs Intravenous Stopped 11/01/22 2358)    Mobility walks with device at baseline      Focused Assessments Full  Secondary assessment    R Recommendations: See Admitting Provider Note  Report given to:   Additional Notes:

## 2022-11-02 NOTE — Progress Notes (Signed)
Patient arrived at the unit,CHG bath given,CCMD notified,Vitals taken,pt oriented to the unit

## 2022-11-02 NOTE — Progress Notes (Addendum)
[facility]  PROGRESS NOTE  Tina Bryant LKG:[id number] DOB: February 27, 1945 DOA: 11/01/2022 PCP: Felix Pacini, FNP   Brief HPI:   77 y.o. female with medical history significant of DVT of the lower extremity, essential hypertension, CKD stage 3B, dyslipidemia and DM type II presented to emergency department with complaining of fall and altered mental status. Patient here via EMS secondary to fall.  She is unclear surrounding circumstances of her fall.  She remembers walking to her back room to adjust the window and then woke up on the floor, she is unsure if she had head injury and loss consciousness.  CT head: No acute intracranial hemorrhage.  CT chest abdomen: IMPRESSION: 1. No acute intrathoracic or intra-abdominal injury. 2. Mild nodular ground-glass pulmonary infiltrate within the right apex, possibly infectious or inflammatory in nature. 3. 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 No follow-up imaging is recommended for this lesion. 4. Mild to moderate pancolonic diverticulosis, most severe within the sigmoid colon. Patient admitted with working diagnosis of pneumonia/sepsis, due to leukocytosis, lactic acidosis.   Assessment/Plan:   Community-acquired pneumonia Severe sepsis secondary to pneumonia -Patient presented after a fall.  Denies passing out. -In the ED was found to have leukocytosis with WBC 26.8 -She was tachycardic with heart rate 110, hypotension 99/58, lactic acidosis 2.4, CT chest showed evidence of pneumonia -Initially started on ceftriaxone and doxycycline -Blood culture growing E. Coli -Antibiotics changed to meropenem -Procalcitonin 6.04    Mechanical fall -Patient denies passing out at home  Acute metabolic encephalo pathy -resolved  E. coli bacteremia -Blood cultures growing E. coli in all 4 bottles -Obtain UA and culture -Continue meropenem    Acute kidney injury on CKD 3B -Creatinine 2.4.  Baseline creatinine is around 1 and GFR  between 1-45. - Acute kidney injury in the setting of hypotension. - Currently on LR 150 cc/h for 20 hours. - Monitor urine output, renal function and avoid nephrotoxic agent. -Holding losartan, Aldactone and hydrochlorothiazide.   Elevated troponin secondary to demand ischemia from sepsis -High sensitive troponin 51.   -Troponin went up to 162  -Likely in setting of demand ischemia  -EKG was unremarkable except for tachycardia, no chest pain  -Continue cardiac monitoring    Microcytic anemia Chronic anemia CKD -Hemoglobin dropped 10-9 in the span of 3 months.  Low MCV 66.3.  Patient does not have any active GI bleed and no history of noticing blood in the stool.   Checking anemia panel.  Continue oral iron supplement.   DVT of lower extremity -Continue Eliquis 5 mg twice daily.  Last dose today morning 10/24.   Essential hypertension -Currently hypotensive in the setting of sepsis.  Holding losartan, Aldactone and hydrochlorothiazide.   Non-insulin-dependent DM type II -A1c 6.5.  Not on any oral regimen at this time.  Diet controlled. - Continue carb modified diet. poc blood glucose check with SSI and at bedtime insulin as needed coverage.   Hyperlipidemia Transaminitis -Elevated AST 61 and ALT 74 and elevated bilirubin 1.3 in the setting of hypotensio and sepsis -Holding Lipitor in the setting of transaminitis.  Will follow-up with liver function panel once stabilized can resume Lipitor.   Anion gap metabolic acidosis due to AKI - Low bicarb 17 and elevated anion gap 18.  Anion gap AMA due to AKI.  Continue to monitor.  Treating for AKI.     Mild hyponatremia - Low sodium 132 a      Medications     apixaban  5 mg Oral  BID   feeding supplement  237 mL Oral TID BM   ferrous sulfate  325 mg Oral Daily   insulin aspart  0-5 Units Subcutaneous QHS   insulin aspart  0-6 Units Subcutaneous TID WC   sodium chloride flush  3 mL Intravenous Q12H     Data Reviewed:    CBG:  Recent Labs  Lab 11/02/22 0036 11/02/22 1018 11/02/22 1153  GLUCAP 118* 137* 122*    SpO2: 100 % O2 Flow Rate (L/min): 0 L/min    Vitals:   11/02/22 1100 11/02/22 1130 11/02/22 1322 11/02/22 1400  BP: (!) 100/48 (!) 91/36 (!) 109/57 (!) 106/57  Pulse: 79 93 99 100  Resp: (!) 23 15 18 18   Temp:   99.5 F (37.5 C) 98.2 F (36.8 C)  TempSrc:   Oral Oral  SpO2: 100% 100% 100% 100%  Weight:      Height:          Data Reviewed:  Basic Metabolic Panel: Recent Labs  Lab 11/01/22 1915 11/01/22 1921  NA 132* 133*  K 4.0 4.1  CL 97* 102  CO2 17*  --   GLUCOSE 158* 161*  BUN 39* 36*  CREATININE 2.35* 2.40*  CALCIUM 9.2  --     CBC: Recent Labs  Lab 11/01/22 1915 11/01/22 1921 11/02/22 1438  WBC 26.8*  --  20.0*  NEUTROABS 24.4*  --   --   HGB 9.0* 10.5* 8.5*  HCT 28.7* 31.0* 26.3*  MCV 66.3*  --  64.0*  PLT 276  --  286    LFT Recent Labs  Lab 11/01/22 1915  AST 61*  ALT 74*  ALKPHOS 64  BILITOT 1.3*  PROT 7.0  ALBUMIN 2.9*     Antibiotics: Anti-infectives (From admission, onward)    Start     Dose/Rate Route Frequency Ordered Stop   11/02/22 2200  doxycycline (VIBRAMYCIN) 100 mg in sodium chloride 0.9 % 250 mL IVPB        100 mg 125 mL/hr over 120 Minutes Intravenous Every 12 hours 11/01/22 2122 11/09/22 2159   11/02/22 1600  meropenem (MERREM) 1 g in sodium chloride 0.9 % 100 mL IVPB        1 g 200 mL/hr over 30 Minutes Intravenous Every 12 hours 11/02/22 1510     11/02/22 0000  cefTRIAXone (ROCEPHIN) 2 g in sodium chloride 0.9 % 100 mL IVPB  Status:  Discontinued        2 g 200 mL/hr over 30 Minutes Intravenous Every 24 hours 11/01/22 2122 11/01/22 2128   11/01/22 2100  cefTRIAXone (ROCEPHIN) 2 g in sodium chloride 0.9 % 100 mL IVPB  Status:  Discontinued        2 g 200 mL/hr over 30 Minutes Intravenous Every 24 hours 11/01/22 2055 11/02/22 1510   11/01/22 2100  azithromycin (ZITHROMAX) 500 mg in sodium chloride 0.9 % 250 mL  IVPB  Status:  Discontinued        500 mg 250 mL/hr over 60 Minutes Intravenous Every 24 hours 11/01/22 2055 11/02/22 0833        DVT prophylaxis: Apixaban  Code Status: Full code  Family Communication: Discussed with patient's granddaughter at bedside   CONSULTS    Subjective   Denies abdominal pain or dysuria.  Denies chest pain or shortness of breath.   Objective    Physical Examination:   General-appears in no acute distress Heart-S1-S2, regular, no murmur auscultated Lungs-clear to auscultation bilaterally, no wheezing or  crackles auscultated Abdomen-soft, nontender, no organomegaly Extremities-no edema in the lower extremities Neuro-alert, oriented x3, no focal deficit noted   Status is: Inpatient:             Meredeth Ide   [facility] If 7PM-7AM, please contact night-coverage at [email or web address], Office  [phone]   11/02/2022, 3:46 PM  LOS: 1 day

## 2022-11-02 NOTE — Evaluation (Signed)
Physical Therapy Evaluation Patient Details Name: Tina Bryant MRN: [id number] DOB: 1945-10-01 Today's Date: 11/02/2022  History of Present Illness  Pt is 77 year old presented to [facility] on  11/01/22 for fall at home with unknown down time and AMS. Pt found to have severe sepsis secondary to PNA. PMH - HTN, CKD, DVT, DM. Pt to have TKA in Dec  Clinical Impression  Pt admitted with above diagnosis and presents to PT with functional limitations due to deficits listed below (See PT problem list). Pt needs skilled PT to maximize independence and safety. Pt lives alone. Per granddaughter pt's family has been trying to get pt to move in with daughter in [place]. Patient will benefit from continued inpatient follow up therapy, <3 hours/day unless family plans to take directly to [place] with family support.          If plan is discharge home, recommend the following: A little help with walking and/or transfers;A little help with bathing/dressing/bathroom;Assistance with cooking/housework;Assist for transportation;Help with stairs or ramp for entrance   Can travel by private vehicle   Yes    Equipment Recommendations None recommended by PT  Recommendations for Other Services       Functional Status Assessment Patient has had a recent decline in their functional status and demonstrates the ability to make significant improvements in function in a reasonable and predictable amount of time.     Precautions / Restrictions Precautions Precautions: Fall Precaution Comments: pt fell at home Restrictions Weight Bearing Restrictions: No      Mobility  Bed Mobility Overal bed mobility: Needs Assistance Bed Mobility: Supine to Sit, Sit to Supine     Supine to sit: Mod assist, HOB elevated Sit to supine: Mod assist, HOB elevated   General bed mobility comments: Assist to bring legs off of bed, elevate trunk into sitting and bring hips to EOB. Assist to bring legs back up into bed.     Transfers Overall transfer level: Needs assistance Equipment used: Rollator (4 wheels), 1 person hand held assist Transfers: Sit to/from Stand, Bed to chair/wheelchair/BSC Sit to Stand: Min assist, +2 physical assistance Stand pivot transfers: Min assist         General transfer comment: Cues for use of rollator and min assist to power up due to knee pain    Ambulation/Gait Ambulation/Gait assistance: Min assist Gait Distance (Feet): 80 Feet Assistive device: Rollator (4 wheels) Gait Pattern/deviations: Step-through pattern, Decreased stride length, Trunk flexed, Narrow base of support, Antalgic Gait velocity: decr Gait velocity interpretation: 1.31 - 2.62 ft/sec, indicative of limited community ambulator   General Gait Details: Assist for balance and support. Pt reports bil knee pain  Stairs            Wheelchair Mobility     Tilt Bed    Modified Rankin (Stroke Patients Only)       Balance Overall balance assessment: Needs assistance Sitting-balance support: Feet supported Sitting balance-Leahy Scale: Good     Standing balance support: Bilateral upper extremity supported, Reliant on assistive device for balance Standing balance-Leahy Scale: Poor Standing balance comment: rollator and min guard for static standing                             Pertinent Vitals/Pain Pain Assessment Pain Assessment: Faces Faces Pain Scale: Hurts little more Pain Location: back and knees Pain Descriptors / Indicators: Aching, Grimacing, Sore Pain Intervention(s): Limited activity within patient's tolerance, Monitored during session  Home Living Family/patient expects to be discharged to:: Private residence Living Arrangements: Alone Available Help at Discharge: Family;Available 24 hours/day Type of Home: Apartment Home Access: Stairs to enter Entrance Stairs-Rails: Left Entrance Stairs-Number of Steps: 2   Home Layout: One level Home Equipment:  Wheelchair - manual;Cane - single point;Rollator (4 wheels)      Prior Function Prior Level of Function : Independent/Modified Independent             Mobility Comments: use walker and rollator at home ADLs Comments: Pt mod I with all adls cooking and cleaning     Extremity/Trunk Assessment   Upper Extremity Assessment Upper Extremity Assessment: Defer to OT evaluation    Lower Extremity Assessment Lower Extremity Assessment: Generalized weakness (bilateral chronic knee pain)    Cervical / Trunk Assessment Cervical / Trunk Assessment: Kyphotic  Communication   Communication Communication: No apparent difficulties  Cognition Arousal: Alert Behavior During Therapy: WFL for tasks assessed/performed Overall Cognitive Status: Within Functional Limits for tasks assessed                                          General Comments General comments (skin integrity, edema, etc.): VSS on Ra    Exercises     Assessment/Plan    PT Assessment Patient needs continued PT services  PT Problem List Decreased strength;Decreased activity tolerance;Decreased balance;Decreased mobility;Pain       PT Treatment Interventions DME instruction;Gait training;Functional mobility training;Therapeutic activities;Therapeutic exercise;Balance training;Patient/family education    PT Goals (Current goals can be found in the Care Plan section)  Acute Rehab PT Goals Patient Stated Goal: return home PT Goal Formulation: With patient Time For Goal Achievement: 11/16/22 Potential to Achieve Goals: Good    Frequency Min 1X/week     Co-evaluation PT/OT/SLP Co-Evaluation/Treatment: Yes Reason for Co-Treatment: To address functional/ADL transfers;For patient/therapist safety PT goals addressed during session: Mobility/safety with mobility OT goals addressed during session: ADL's and self-care       AM-PAC PT "6 Clicks" Mobility  Outcome Measure Help needed turning from your  back to your side while in a flat bed without using bedrails?: A Lot Help needed moving from lying on your back to sitting on the side of a flat bed without using bedrails?: A Lot Help needed moving to and from a bed to a chair (including a wheelchair)?: A Little Help needed standing up from a chair using your arms (e.g., wheelchair or bedside chair)?: A Little Help needed to walk in hospital room?: A Little Help needed climbing 3-5 steps with a railing? : Total 6 Click Score: 14    End of Session Equipment Utilized During Treatment: Gait belt Activity Tolerance: Patient limited by pain Patient left: in bed;with call bell/phone within reach;with family/visitor present   PT Visit Diagnosis: Other abnormalities of gait and mobility (R26.89);Muscle weakness (generalized) (M62.81);History of falling (Z91.81);Pain Pain - Right/Left: Right (bilateral) Pain - part of body: Knee    Time: 1200-1230 PT Time Calculation (min) (ACUTE ONLY): 30 min   Charges:   PT Evaluation $PT Eval Moderate Complexity: 1 Mod   PT General Charges $$ ACUTE PT VISIT: 1 Visit         [facility] PT Acute Rehabilitation Services Office [phone]   Angelina Ok [facility] 11/02/2022, 1:13 PM

## 2022-11-02 NOTE — Progress Notes (Signed)
  Elevated troponin secondary to demand ischemia from sepsis -High sensitive troponin 51 trended up to 162.Marland Kitchen  EKG unremarkable except tachycardia.  Patient denies any chest pressure and chest pain.  Elevated troponin in the setting of demand ischemia from sepsis.   ACS ruled out.   -Plan to continue to trend troponin.  Continue cardiac monitoring.  Tereasa Coop, MD [facility] 11/02/2022, 1:21 AM

## 2022-11-02 NOTE — Evaluation (Signed)
Occupational Therapy Evaluation Patient Details Name: Tina Bryant MRN: [id number] DOB: 02-20-1945 Today's Date: 11/02/2022   History of Present Illness Pt is 77 year old presented [facility] on  [facility]/01/[facility] for fall at home with unknown down time and AMS. Pt found to have severe sepsis secondary to PNA. PMH - HTN, CKD, DVT, DM. Pt to have TKA in Dec   Clinical Impression   Pt admitted with the above diagnosis and has the deficits outlined below. Pt would benefit from cont OT to increase independence with basic adls and adl transfers so she can eventually d/c back home. Feel pt need someone with her 24/7 at this point.  Granddaughter lives here and her children live in [place].  Children would like her to move in with them.  If pt does not have 24/7 assist she may benefit from less than 3 hours of therapy a day prior to going home. Will continue to see with focus on fall prevention and adls.       If plan is discharge home, recommend the following: A little help with walking and/or transfers;A little help with bathing/dressing/bathroom;Assist for transportation;Help with stairs or ramp for entrance    Functional Status Assessment  Patient has had a recent decline in their functional status and demonstrates the ability to make significant improvements in function in a reasonable and predictable amount of time.  Equipment Recommendations  None recommended by OT    Recommendations for Other Services       Precautions / Restrictions Precautions Precautions: Fall Precaution Comments: pt fell at home Restrictions Weight Bearing Restrictions: No      Mobility Bed Mobility Overal bed mobility: Needs Assistance Bed Mobility: Supine to Sit, Sit to Supine     Supine to sit: Mod assist Sit to supine: Mod assist   General bed mobility comments: required assist to get legs off bed and scoot hips to side of bed.    Transfers Overall transfer level: Needs assistance Equipment used: Rollator  (4 wheels), 1 person hand held assist Transfers: Sit to/from Stand, Bed to chair/wheelchair/BSC Sit to Stand: Min assist, +2 physical assistance Stand pivot transfers: Min assist         General transfer comment: Cues for use of rollator and min assist to power up due to knee pain      Balance Overall balance assessment: Needs assistance Sitting-balance support: Feet supported Sitting balance-Leahy Scale: Good     Standing balance support: Bilateral upper extremity supported, Reliant on assistive device for balance Standing balance-Leahy Scale: Poor Standing balance comment: reliant on outside support                           ADL either performed or assessed with clinical judgement   ADL Overall ADL's : Needs assistance/impaired Eating/Feeding: Independent;Sitting   Grooming: Wash/dry hands;Wash/dry face;Supervision/safety;Standing   Upper Body Bathing: Set up;Sitting   Lower Body Bathing: Minimal assistance;Sit to/from stand;Cueing for compensatory techniques   Upper Body Dressing : Set up;Sitting   Lower Body Dressing: Minimal assistance;Sit to/from stand;Cueing for compensatory techniques   Toilet Transfer: Minimal assistance;Stand-pivot;BSC/3in1   Toileting- Clothing Manipulation and Hygiene: Supervision/safety;Sitting/lateral lean       Functional mobility during ADLs: Minimal assistance;Rollator (4 wheels) General ADL Comments: Pt moving slowly and fatigued with decreased balance. First time up since fall mobilzed well.     Vision Baseline Vision/History: 1 Wears glasses Ability to See in Adequate Light: 0 Adequate Patient Visual Report: No  change from baseline Vision Assessment?: No apparent visual deficits     Perception Perception: Within Functional Limits       Praxis Praxis: WFL       Pertinent Vitals/Pain Pain Assessment Pain Assessment: Faces Faces Pain Scale: Hurts little more Pain Location: back and knees Pain Descriptors /  Indicators: Aching, Grimacing, Sore Pain Intervention(s): Limited activity within patient's tolerance, Monitored during session, Repositioned     Extremity/Trunk Assessment Upper Extremity Assessment Upper Extremity Assessment: Generalized weakness   Lower Extremity Assessment Lower Extremity Assessment: Defer to PT evaluation   Cervical / Trunk Assessment Cervical / Trunk Assessment: Kyphotic   Communication Communication Communication: No apparent difficulties   Cognition Arousal: Alert Behavior During Therapy: WFL for tasks assessed/performed Overall Cognitive Status: Within Functional Limits for tasks assessed                                       General Comments  Pt lives alone. Will need someone with her due to new weakness at this point. Family states she may go and live with them in [place] at some point.    Exercises     Shoulder Instructions      Home Living Family/patient expects to be discharged to:: Private residence Living Arrangements: Alone Available Help at Discharge: Family;Available 24 hours/day Type of Home: Apartment Home Access: Stairs to enter Entrance Stairs-Number of Steps: 2 Entrance Stairs-Rails: Left Home Layout: One level     Bathroom Shower/Tub: IT trainer: Standard     Home Equipment: Wheelchair - manual;Cane - single point;Rollator (4 wheels)          Prior Functioning/Environment Prior Level of Function : Independent/Modified Independent             Mobility Comments: use walker and rollator at home ADLs Comments: Pt mod I with all adls cooking and cleaning        OT Problem List: Decreased strength;Impaired balance (sitting and/or standing);Decreased knowledge of use of DME or AE;Pain      OT Treatment/Interventions: Self-care/ADL training;Therapeutic activities;Balance training;DME and/or AE instruction    OT Goals(Current goals can be found in the care plan  section) Acute Rehab OT Goals Patient Stated Goal: to get strong and go home OT Goal Formulation: With patient Time For Goal Achievement: 11/16/22 Potential to Achieve Goals: Good ADL Goals Pt Will Perform Grooming: with supervision;standing Pt Will Perform Lower Body Bathing: with supervision;sit to/from stand Pt Will Perform Lower Body Dressing: with supervision;sit to/from stand Additional ADL Goal #1: pt will walk to bathroom with rollator and complete all toileting tasks with supevision  OT Frequency: Min 1X/week    Co-evaluation PT/OT/SLP Co-Evaluation/Treatment: Yes Reason for Co-Treatment: To address functional/ADL transfers PT goals addressed during session: Mobility/safety with mobility OT goals addressed during session: ADL's and self-care      AM-PAC OT "6 Clicks" Daily Activity     Outcome Measure Help from another person eating meals?: None Help from another person taking care of personal grooming?: A Little Help from another person toileting, which includes using toliet, bedpan, or urinal?: A Little Help from another person bathing (including washing, rinsing, drying)?: A Little Help from another person to put on and taking off regular upper body clothing?: A Little Help from another person to put on and taking off regular lower body clothing?: A Little 6 Click Score: 19   End of Session  Equipment Utilized During Treatment: Rollator (4 wheels);Gait belt Nurse Communication: Mobility status  Activity Tolerance: Patient tolerated treatment well Patient left: in bed;with call bell/phone within reach;with family/visitor present  OT Visit Diagnosis: Unsteadiness on feet (R26.81)                Time: 1200-1230 OT Time Calculation (min): 30 min Charges:  OT General Charges $OT Visit: 1 Visit OT Evaluation $OT Eval Moderate Complexity: 1 Mod  Hope Budds 11/02/2022, 12:44 PM

## 2022-11-03 DIAGNOSIS — R4182 Altered mental status, unspecified: Secondary | ICD-10-CM | POA: Diagnosis not present

## 2022-11-03 DIAGNOSIS — N179 Acute kidney failure, unspecified: Secondary | ICD-10-CM | POA: Diagnosis not present

## 2022-11-03 DIAGNOSIS — A419 Sepsis, unspecified organism: Secondary | ICD-10-CM | POA: Diagnosis not present

## 2022-11-03 DIAGNOSIS — W19XXXA Unspecified fall, initial encounter: Secondary | ICD-10-CM | POA: Diagnosis not present

## 2022-11-03 LAB — COMPREHENSIVE METABOLIC PANEL
ALT: 63 U/L — ABNORMAL HIGH (ref 0–44)
AST: 46 U/L — ABNORMAL HIGH (ref 15–41)
Albumin: 2 g/dL — ABNORMAL LOW (ref 3.5–5.0)
Alkaline Phosphatase: 61 U/L (ref 38–126)
Anion gap: 9 (ref 5–15)
BUN: 32 mg/dL — ABNORMAL HIGH (ref 8–23)
CO2: 23 mmol/L (ref 22–32)
Calcium: 8.9 mg/dL (ref 8.9–10.3)
Chloride: 107 mmol/L (ref 98–111)
Creatinine, Ser: 1.45 mg/dL — ABNORMAL HIGH (ref 0.44–1.00)
GFR, Estimated: 37 mL/min — ABNORMAL LOW (ref >60)
Glucose, Bld: 117 mg/dL — ABNORMAL HIGH (ref 70–99)
Potassium: 4.1 mmol/L (ref 3.5–5.1)
Sodium: 139 mmol/L (ref 135–145)
Total Bilirubin: 0.6 mg/dL (ref 0.3–1.2)
Total Protein: 5.6 g/dL — ABNORMAL LOW (ref 6.5–8.1)

## 2022-11-03 LAB — GLUCOSE, CAPILLARY
Glucose-Capillary: 103 mg/dL — ABNORMAL HIGH (ref 70–99)
Glucose-Capillary: 128 mg/dL — ABNORMAL HIGH (ref 70–99)
Glucose-Capillary: 136 mg/dL — ABNORMAL HIGH (ref 70–99)
Glucose-Capillary: 104 mg/dL — ABNORMAL HIGH (ref 70–99)

## 2022-11-03 LAB — URINE CULTURE: Culture: NO GROWTH

## 2022-11-03 LAB — CBC
HCT: 23.2 % — ABNORMAL LOW (ref 36.0–46.0)
Hemoglobin: 7.6 g/dL — ABNORMAL LOW (ref 12.0–15.0)
MCH: 20.9 pg — ABNORMAL LOW (ref 26.0–34.0)
MCHC: 32.8 g/dL (ref 30.0–36.0)
MCV: 63.9 fL — ABNORMAL LOW (ref 80.0–100.0)
Platelets: 265 10*3/uL (ref 150–400)
RBC: 3.63 MIL/uL — ABNORMAL LOW (ref 3.87–5.11)
RDW: 16.3 % — ABNORMAL HIGH (ref 11.5–15.5)
WBC: 12.7 10*3/uL — ABNORMAL HIGH (ref 4.0–10.5)
nRBC: 0.2 % (ref 0.0–0.2)

## 2022-11-03 MED ORDER — LACTATED RINGERS IV SOLN: INTRAVENOUS | Status: AC

## 2022-11-03 NOTE — Progress Notes (Signed)
Mobility Specialist Progress Note:   11/03/22 1419  Mobility  Activity Ambulated with assistance in hallway  Level of Assistance Minimal assist, patient does 75% or more  Assistive Device Front wheel walker  Distance Ambulated (ft) 140 ft  Activity Response Tolerated well  Mobility Referral Yes  $Mobility charge 1 Mobility  Mobility Specialist Start Time (ACUTE ONLY) 1355  Mobility Specialist Stop Time (ACUTE ONLY) 1415  Mobility Specialist Time Calculation (min) (ACUTE ONLY) 20 min   During Mobility: 128 HR  Post Mobility: 111 HR  Pt received in bed, agreeable to mobility. ModA bed mobility. MinA to stand. CG during ambulation. C/o L knee pain during ambulation, otherwise asymptomatic throughout. Pt returned to bed with call bell in reach and all needs met. Family present.   Leory Plowman  Mobility Specialist Please contact via [employer] office at [phone]

## 2022-11-03 NOTE — Progress Notes (Signed)
[facility]  PROGRESS NOTE  Tina Bryant ZOX:[id number] DOB: September 22, 1945 DOA: 11/01/2022 PCP: Felix Pacini, FNP   Brief HPI:   77 y.o. female with medical history significant of DVT of the lower extremity, essential hypertension, CKD stage 3B, dyslipidemia and DM type II presented to emergency department with complaining of fall and altered mental status. Patient here via EMS secondary to fall.  She is unclear surrounding circumstances of her fall.  She remembers walking to her back room to adjust the window and then woke up on the floor, she is unsure if she had head injury and loss consciousness.  CT head: No acute intracranial hemorrhage.  CT chest abdomen: IMPRESSION: 1. No acute intrathoracic or intra-abdominal injury. 2. Mild nodular ground-glass pulmonary infiltrate within the right apex, possibly infectious or inflammatory in nature. 3. 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 No follow-up imaging is recommended for this lesion. 4. Mild to moderate pancolonic diverticulosis, most severe within the sigmoid colon. Patient admitted with working diagnosis of pneumonia/sepsis, due to leukocytosis, lactic acidosis.   Assessment/Plan:   Community-acquired pneumonia Severe sepsis secondary to pneumonia -Patient presented after a fall.  Denies passing out. -In the ED was found to have leukocytosis with WBC 26.8 -She was tachycardic with heart rate 110, hypotension 99/58, lactic acidosis 2.4, CT chest showed evidence of pneumonia -Initially started on ceftriaxone and doxycycline -Blood culture growing E. Coli -Antibiotics changed to meropenem -Procalcitonin 6.04  E. coli bacteremia -Blood culture growing ESBL E. Coli -Antibiotics changed to meropenem -Follow blood culture results  Chronic back pain -Gets steroid injections at the spine clinic as outpatient -CT of the lumbar spine done on admission was unremarkable -Discussed with ID, low likelihood of discitis; due  to normal CT of the lumbar spine on admission  Mechanical fall -Patient denies passing out at home  Acute metabolic encephalo pathy -resolved  E. coli bacteremia -Blood cultures growing E. coli in all 4 bottles -Obtain UA and culture -Continue meropenem    Acute kidney injury on CKD 3B -Creatinine 2.4.  Baseline creatinine is around 1 and GFR between 1-45. - Acute kidney injury in the setting of hypotension. -Creatinine has improved to 1.45 - Will start LR at 100 mL/h for 20 hours. - Monitor urine output, renal function and avoid nephrotoxic agent. -Holding losartan, Aldactone and hydrochlorothiazide.   Elevated troponin secondary to demand ischemia from sepsis -High sensitive troponin 51.   -Troponin went up to 162  -Likely in setting of demand ischemia  -EKG was unremarkable except for tachycardia, no chest pain  -Continue cardiac monitoring    Microcytic anemia Chronic anemia CKD -Hemoglobin dropped 10-9 in the span of 3 months.  Low MCV 66.3.  Patient does not have any active GI bleed and no history of noticing blood in the stool.   Checking anemia panel.  Continue oral iron supplement.   DVT of lower extremity -Continue Eliquis 5 mg twice daily.      Essential hypertension -Currently hypotensive in the setting of sepsis.  Holding losartan, Aldactone and hydrochlorothiazide.   Non-insulin-dependent DM type II -A1c 6.5.  Not on any oral regimen at this time.  Diet controlled. - Continue carb modified diet. poc blood glucose check with SSI and at bedtime insulin as needed coverage.   Hyperlipidemia Transaminitis -Elevated AST 61 and ALT 74 and elevated bilirubin 1.3 in the setting of hypotension and sepsis -Holding Lipitor in the setting of transaminitis.    Anion gap metabolic acidosis due to AKI -Resolved -  Low bicarb 17 and elevated anion gap 18.      Mild hyponatremia - Resolved with IV fluids      Medications     apixaban  5 mg Oral BID    feeding supplement  237 mL Oral TID BM   ferrous sulfate  325 mg Oral Daily   insulin aspart  0-5 Units Subcutaneous QHS   insulin aspart  0-6 Units Subcutaneous TID WC   sodium chloride flush  3 mL Intravenous Q12H     Data Reviewed:   CBG:  Recent Labs  Lab 11/02/22 1018 11/02/22 1153 11/02/22 1603 11/02/22 2117 11/03/22 0601  GLUCAP 137* 122* 142* 117* 103*    SpO2: 100 % O2 Flow Rate (L/min): 0 L/min    Vitals:   11/02/22 2319 11/03/22 0324 11/03/22 0604 11/03/22 0750  BP: (!) 116/56 (!) 106/58  (!) 121/54  Pulse: 97 78 82 87  Resp: 20 17 17 17   Temp: 98.4 F (36.9 C) 98.1 F (36.7 C)  98 F (36.7 C)  TempSrc: Oral Oral  Oral  SpO2: 99% 99% 99% 100%  Weight:   85.4 kg   Height:          Data Reviewed:  Basic Metabolic Panel: Recent Labs  Lab 11/01/22 1915 11/01/22 1921 11/02/22 1438 11/03/22 0328  NA 132* 133* 135 139  K 4.0 4.1 3.9 4.1  CL 97* 102 103 107  CO2 17*  --  22 23  GLUCOSE 158* 161* 147* 117*  BUN 39* 36* 30* 32*  CREATININE 2.35* 2.40* 1.52* 1.45*  CALCIUM 9.2  --  8.7* 8.9    CBC: Recent Labs  Lab 11/01/22 1915 11/01/22 1921 11/02/22 1438 11/03/22 0328  WBC 26.8*  --  20.0* 12.7*  NEUTROABS 24.4*  --   --   --   HGB 9.0* 10.5* 8.5* 7.6*  HCT 28.7* 31.0* 26.3* 23.2*  MCV 66.3*  --  64.0* 63.9*  PLT 276  --  286 265    LFT Recent Labs  Lab 11/01/22 1915 11/02/22 1438 11/03/22 0328  AST 61* 55* 46*  ALT 74* 67* 63*  ALKPHOS 64 65 61  BILITOT 1.3* 0.6 0.6  PROT 7.0 6.3* 5.6*  ALBUMIN 2.9* 2.4* 2.0*     Antibiotics: Anti-infectives (From admission, onward)    Start     Dose/Rate Route Frequency Ordered Stop   11/02/22 2200  doxycycline (VIBRAMYCIN) 100 mg in sodium chloride 0.9 % 250 mL IVPB        100 mg 125 mL/hr over 120 Minutes Intravenous Every 12 hours 11/01/22 2122 11/09/22 2159   11/02/22 1600  meropenem (MERREM) 1 g in sodium chloride 0.9 % 100 mL IVPB        1 g 200 mL/hr over 30 Minutes  Intravenous Every 12 hours 11/02/22 1510     11/02/22 0000  cefTRIAXone (ROCEPHIN) 2 g in sodium chloride 0.9 % 100 mL IVPB  Status:  Discontinued        2 g 200 mL/hr over 30 Minutes Intravenous Every 24 hours 11/01/22 2122 11/01/22 2128   11/01/22 2100  cefTRIAXone (ROCEPHIN) 2 g in sodium chloride 0.9 % 100 mL IVPB  Status:  Discontinued        2 g 200 mL/hr over 30 Minutes Intravenous Every 24 hours 11/01/22 2055 11/02/22 1510   11/01/22 2100  azithromycin (ZITHROMAX) 500 mg in sodium chloride 0.9 % 250 mL IVPB  Status:  Discontinued  500 mg 250 mL/hr over 60 Minutes Intravenous Every 24 hours 11/01/22 2055 11/02/22 2956        DVT prophylaxis: Apixaban  Code Status: Full code  Family Communication: Discussed with patient's daughter at bedside   CONSULTS    Subjective   Blood culture growing ESBL E. coli.  Antibiotics changed to meropenem.  Patient did get steroid injection in lower back for chronic back pain 3 weeks ago.  CT of the lumbar spine did not show any fluid collection.   Objective    Physical Examination:   General-appears in no acute distress Heart-S1-S2, regular, no murmur auscultated Lungs-clear to auscultation bilaterally, no wheezing or crackles auscultated Abdomen-soft, nontender, no organomegaly Extremities-no edema in the lower extremities Neuro-alert, oriented x3, no focal deficit noted   Status is: Inpatient:             Meredeth Ide   [facility] If 7PM-7AM, please contact night-coverage at [email or web address], Office  [phone]   11/03/2022, 10:53 AM  LOS: 2 days

## 2022-11-04 DIAGNOSIS — N179 Acute kidney failure, unspecified: Secondary | ICD-10-CM | POA: Diagnosis not present

## 2022-11-04 DIAGNOSIS — G9341 Metabolic encephalopathy: Secondary | ICD-10-CM | POA: Diagnosis not present

## 2022-11-04 DIAGNOSIS — W19XXXA Unspecified fall, initial encounter: Secondary | ICD-10-CM | POA: Diagnosis not present

## 2022-11-04 DIAGNOSIS — A419 Sepsis, unspecified organism: Secondary | ICD-10-CM | POA: Diagnosis not present

## 2022-11-04 LAB — CULTURE, BLOOD (ROUTINE X 2)
Special Requests: ADEQUATE
Special Requests: ADEQUATE

## 2022-11-04 LAB — GLUCOSE, CAPILLARY
Glucose-Capillary: 84 mg/dL (ref 70–99)
Glucose-Capillary: 96 mg/dL (ref 70–99)
Glucose-Capillary: 100 mg/dL — ABNORMAL HIGH (ref 70–99)
Glucose-Capillary: 129 mg/dL — ABNORMAL HIGH (ref 70–99)

## 2022-11-04 LAB — COMPREHENSIVE METABOLIC PANEL
ALT: 66 U/L — ABNORMAL HIGH (ref 0–44)
AST: 44 U/L — ABNORMAL HIGH (ref 15–41)
Albumin: 2 g/dL — ABNORMAL LOW (ref 3.5–5.0)
Alkaline Phosphatase: 54 U/L (ref 38–126)
Anion gap: 7 (ref 5–15)
BUN: 31 mg/dL — ABNORMAL HIGH (ref 8–23)
CO2: 24 mmol/L (ref 22–32)
Calcium: 8.9 mg/dL (ref 8.9–10.3)
Chloride: 106 mmol/L (ref 98–111)
Creatinine, Ser: 1.09 mg/dL — ABNORMAL HIGH (ref 0.44–1.00)
GFR, Estimated: 52 mL/min — ABNORMAL LOW (ref >60)
Glucose, Bld: 109 mg/dL — ABNORMAL HIGH (ref 70–99)
Potassium: 4.3 mmol/L (ref 3.5–5.1)
Sodium: 137 mmol/L (ref 135–145)
Total Bilirubin: 0.4 mg/dL (ref 0.3–1.2)
Total Protein: 5.2 g/dL — ABNORMAL LOW (ref 6.5–8.1)

## 2022-11-04 LAB — CBC
HCT: 22.8 % — ABNORMAL LOW (ref 36.0–46.0)
Hemoglobin: 7.2 g/dL — ABNORMAL LOW (ref 12.0–15.0)
MCH: 20.1 pg — ABNORMAL LOW (ref 26.0–34.0)
MCHC: 31.6 g/dL (ref 30.0–36.0)
MCV: 63.5 fL — ABNORMAL LOW (ref 80.0–100.0)
Platelets: 270 10*3/uL (ref 150–400)
RBC: 3.59 MIL/uL — ABNORMAL LOW (ref 3.87–5.11)
RDW: 16.4 % — ABNORMAL HIGH (ref 11.5–15.5)
WBC: 9.1 10*3/uL (ref 4.0–10.5)
nRBC: 0 % (ref 0.0–0.2)

## 2022-11-04 NOTE — TOC Initial Note (Addendum)
Transition of Care [facility]) - Initial/Assessment Note    Patient Details  Name: Willoe Bryant MRN: [id number] Date of Birth: 1945-12-03  Transition [facility] Care [facility]) CM/SW Contact:    Tina Bryant, Tina Bryant Phone Number: 11/04/2022, 12:44 PM  Clinical Narrative:                  CSW met with pt at bedside along with pt's family members. Pt stated that she wanted to return home instead of SNF but she and family are going to discuss options and decide SNF versus Home.   CMRN to follow up with pt in event pt wants home and options for home services.   FL2 completed, please fax out if pt decides on SNF at discharge. Disposition plan pending.   CSW provided with following contact support for pt. CSW to reach out and follow up with [place] and pt at bedside to determine plan.   Tina Bryant, [phone] - point of contact.  Tina Bryant, [phone] Tina Bryant [phone].   TOC will continue to follow.  Expected Discharge Plan: Skilled Nursing Facility Barriers to Discharge: Continued Medical Work up, SNF Pending bed offer, Insurance Authorization   Patient Goals and CMS Choice Patient states their goals for this hospitalization and ongoing recovery are:: Want to go home          Expected Discharge Plan and Services       Living arrangements for the past 2 months: Single Family Home                                      Prior Living Arrangements/Services Living arrangements for the past 2 months: Single Family Home Lives with:: Self          Need for Family Participation in Patient Care: Yes (Comment) Care giver support system in place?: Yes (comment)      Activities of Daily Living   ADL Screening (condition at time of admission) Independently performs ADLs?: No Does the patient have a NEW difficulty with bathing/dressing/toileting/self-feeding that is expected to last >3 days?: Yes (Initiates electronic notice to provider for possible OT consult) Does the  patient have a NEW difficulty with getting in/out of bed, walking, or climbing stairs that is expected to last >3 days?: Yes (Initiates electronic notice to provider for possible PT consult) Does the patient have a NEW difficulty with communication that is expected to last >3 days?: No Is the patient deaf or have difficulty hearing?: No Does the patient have difficulty seeing, even when wearing glasses/contacts?: No Does the patient have difficulty concentrating, remembering, or making decisions?: No  Permission Sought/Granted                  Emotional Assessment Appearance:: Well-Groomed Attitude/Demeanor/Rapport:  (normal) Affect (typically observed): Quiet Orientation: : Oriented to Self, Oriented to Place, Oriented to  Time, Oriented to Situation Alcohol / Substance Use: Not Applicable Psych Involvement: No (comment)  Admission diagnosis:  AKI (acute kidney injury) (HCC) [N17.9] Fall, initial encounter [W19.XXXA] Sepsis (HCC) [A41.9] Altered mental status, unspecified altered mental status type [R41.82] Sepsis with acute renal failure, due to unspecified organism, unspecified acute renal failure type, unspecified whether septic shock present (HCC) [A41.9, R65.20, N17.9] Patient Active Problem List   Diagnosis Date Noted   DVT (deep venous thrombosis) (HCC) 11/01/2022   Fall at home, initial encounter 11/01/2022   CAP (community acquired pneumonia) 11/01/2022   Severe  sepsis (HCC) 11/01/2022   AKI (acute kidney injury) (HCC) 11/01/2022   Acute metabolic encephalopathy 11/01/2022   CKD (chronic kidney disease) stage 3, GFR 30-59 ml/min (HCC) 11/01/2022   Elevated troponin 11/01/2022   Non-insulin treated type 2 diabetes mellitus (HCC) 11/01/2022   High anion gap metabolic acidosis 11/01/2022   Acute deep vein thrombosis (DVT) of femoral vein of left lower extremity (HCC) 06/08/2022   Hyperlipidemia 07/09/2006   Microcytic anemia 07/09/2006   DEPRESSION 07/09/2006    Essential hypertension 07/09/2006   PCP:  Tina Pacini, FNP Pharmacy:   [facility] - [place], [place] - [address] [place] [address] Phone: [phone] Fax: [phone]  [facility] [address] [place] [address] Phone: [phone] Fax: [phone]     Social Determinants of Health (SDOH) Social History: SDOH Screenings   Food Insecurity: No Food Insecurity (11/02/2022)  Housing: Low Risk  (11/02/2022)  Transportation Needs: No Transportation Needs (11/02/2022)  Utilities: Not At Risk (11/02/2022)  Tobacco Use: Unknown (11/01/2022)   SDOH Interventions:     Readmission Risk Interventions     No data to display

## 2022-11-04 NOTE — [place] FL2 (Signed)
[place] MEDICAID FL2 LEVEL OF CARE FORM     IDENTIFICATION  Patient Name: Tina Bryant Birthdate: 28-May-1945 Sex: female Admission Date (Current Location): 11/01/2022  [place] and [place] Number:  Haynes Bast [id number] Facility and Address:  [facility], [address] [place], [place] [address]      Provider Number: [id number]  Attending Physician Name and Address:  Meredeth Ide, MD  Relative Name and Phone Number:  Gaspar Garbe, [phone]    Current Level of Care: Hospital Recommended Level of Care: Skilled Nursing Facility Prior Approval Number:    Date Approved/Denied:   PASRR Number: [id number]  Discharge Plan: SNF    Current Diagnoses: Patient Active Problem List   Diagnosis Date Noted   DVT (deep venous thrombosis) (HCC) 11/01/2022   Fall at home, initial encounter 11/01/2022   CAP (community acquired pneumonia) 11/01/2022   Severe sepsis (HCC) 11/01/2022   AKI (acute kidney injury) (HCC) 11/01/2022   Acute metabolic encephalopathy 11/01/2022   CKD (chronic kidney disease) stage 3, GFR 30-59 ml/min (HCC) 11/01/2022   Elevated troponin 11/01/2022   Non-insulin treated type 2 diabetes mellitus (HCC) 11/01/2022   High anion gap metabolic acidosis 11/01/2022   Acute deep vein thrombosis (DVT) of femoral vein of left lower extremity (HCC) 06/08/2022   Hyperlipidemia 07/09/2006   Microcytic anemia 07/09/2006   DEPRESSION 07/09/2006   Essential hypertension 07/09/2006    Orientation RESPIRATION BLADDER Height & Weight     Self, Time, Situation, Place  Normal Incontinent, External catheter Weight: 193 lb 9 oz (87.8 kg) Height:  5\' 6"  (167.6 cm)  BEHAVIORAL SYMPTOMS/MOOD NEUROLOGICAL BOWEL NUTRITION STATUS      Continent Diet (see DC summary)  AMBULATORY STATUS COMMUNICATION OF NEEDS Skin   Limited Assist Verbally Normal                       Personal Care Assistance Level of Assistance  Bathing, Feeding,  Dressing Bathing Assistance: Limited assistance Feeding assistance: Independent Dressing Assistance: Limited assistance     Functional Limitations Info  Sight, Hearing, Speech Sight Info: Adequate Hearing Info: Adequate Speech Info: Adequate    SPECIAL CARE FACTORS FREQUENCY  PT (By licensed PT), OT (By licensed OT)     PT Frequency: 5x/week OT Frequency: 5x/week            Contractures Contractures Info: Not present    Additional Factors Info  Code Status, Allergies Code Status Info: FULL Allergies Info: Tramadol  Lisinopril           Current Medications (11/04/2022):  This is the current hospital active medication list Current Facility-Administered Medications  Medication Dose Route Frequency Provider Last Rate Last Admin   acetaminophen (TYLENOL) tablet 650 mg  650 mg Oral Q6H PRN Janalyn Shy, Subrina, MD       Or   acetaminophen (TYLENOL) suppository 650 mg  650 mg Rectal Q6H PRN Janalyn Shy, Subrina, MD       apixaban Everlene Balls) tablet 5 mg  5 mg Oral BID Sundil, Subrina, MD   5 mg at 11/04/22 0835   feeding supplement (ENSURE ENLIVE / ENSURE PLUS) liquid 237 mL  237 mL Oral TID BM Sundil, Subrina, MD   237 mL at 11/03/22 2045   ferrous sulfate tablet 325 mg  325 mg Oral Daily Sundil, Subrina, MD   325 mg at 11/04/22 0836   insulin aspart (novoLOG) injection 0-5 Units  0-5 Units Subcutaneous QHS Tereasa Coop, MD  insulin aspart (novoLOG) injection 0-6 Units  0-6 Units Subcutaneous TID WC Sundil, Subrina, MD       meropenem (MERREM) 1 g in sodium chloride 0.9 % 100 mL IVPB  1 g Intravenous Q12H Meredeth Ide, MD 200 mL/hr at 11/04/22 0454 1 g at 11/04/22 0454   ondansetron (ZOFRAN) tablet 4 mg  4 mg Oral Q6H PRN Tereasa Coop, MD       Or   ondansetron [facility]) injection 4 mg  4 mg Intravenous Q6H PRN Janalyn Shy, Subrina, MD       senna-docusate (Senokot-S) tablet 1 tablet  1 tablet Oral QHS PRN Janalyn Shy, Subrina, MD       sodium chloride flush (NS) 0.9 % injection 3 mL   3 mL Intravenous Q12H Sundil, Subrina, MD   3 mL at 11/04/22 0836   sodium chloride flush (NS) 0.9 % injection 3 mL  3 mL Intravenous PRN Tereasa Coop, MD         Discharge Medications: Please see discharge summary for a list of discharge medications.  Relevant Imaging Results:  Relevant Lab Results:   Additional Information SSN: [id number]  Xzayvion Vaeth A [place], [place]

## 2022-11-04 NOTE — Progress Notes (Signed)
Mobility Specialist Progress Note:   11/04/22 1506  Mobility  Activity Ambulated with assistance in hallway  Level of Assistance Minimal assist, patient does 75% or more  Assistive Device Front wheel walker  Distance Ambulated (ft) 140 ft  Activity Response Tolerated well  Mobility Referral Yes  $Mobility charge 1 Mobility  Mobility Specialist Start Time (ACUTE ONLY) 1440  Mobility Specialist Stop Time (ACUTE ONLY) 1505  Mobility Specialist Time Calculation (min) (ACUTE ONLY) 25 min   Pre Mobility: 98 HR During Mobility: 123 HR   Pt received in bed, agreeable to mobility with encouragement. Pt c/o knee pain during ambulation, otherwise asymptomatic throughout. 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 Assisted NT with linen change. Pt left EOB with NT still present in room.   Leory Plowman  Mobility Specialist Please contact via [employer] office at [phone]

## 2022-11-04 NOTE — Progress Notes (Signed)
[facility]  PROGRESS NOTE  Tina Bryant XLK:[id number] DOB: Nov 18, 1945 DOA: 11/01/2022 PCP: Felix Pacini, FNP   Brief HPI:   77 y.o. female with medical history significant of DVT of the lower extremity, essential hypertension, CKD stage 3B, dyslipidemia and DM type II presented to emergency department with complaining of fall and altered mental status. Patient here via EMS secondary to fall.  She is unclear surrounding circumstances of her fall.  She remembers walking to her back room to adjust the window and then woke up on the floor, she is unsure if she had head injury and loss consciousness.  CT head: No acute intracranial hemorrhage.  CT chest abdomen: IMPRESSION: 1. No acute intrathoracic or intra-abdominal injury. 2. Mild nodular ground-glass pulmonary infiltrate within the right apex, possibly infectious or inflammatory in nature. 3. 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 No follow-up imaging is recommended for this lesion. 4. Mild to moderate pancolonic diverticulosis, most severe within the sigmoid colon. Patient admitted with working diagnosis of pneumonia/sepsis, due to leukocytosis, lactic acidosis.   Assessment/Plan:   Community-acquired pneumonia Severe sepsis secondary to pneumonia -Patient presented after a fall.  Denies passing out. -In the ED was found to have leukocytosis with WBC 26.8 -She was tachycardic with heart rate 110, hypotension 99/58, lactic acidosis 2.4, CT chest showed evidence of pneumonia -Initially started on ceftriaxone and doxycycline -Blood culture growing E. Coli -Antibiotics changed to meropenem -Procalcitonin 6.04  E. coli bacteremia -Blood culture growing ESBL E. Coli -Antibiotics changed to meropenem -Follow blood culture results -Urine culture showed no growth  Chronic back pain -Gets steroid injections at the spine clinic as outpatient -CT of the lumbar spine done on admission was unremarkable -Discussed with  ID, low likelihood of discitis; due to normal CT of the lumbar spine on admission  Mechanical fall -Patient denies passing out at home  Acute metabolic encephalo pathy -resolved  Acute kidney injury on CKD 3B -Creatinine 2.4.  Baseline creatinine is around 1 and GFR between 1-45. - Acute kidney injury in the setting of hypotension. -Creatinine has improved to 1.09 with IV fluids - Will discontinue IV fluids - Monitor urine output, renal function and avoid nephrotoxic agent. -Holding losartan, Aldactone and hydrochlorothiazide.   Elevated troponin secondary to demand ischemia from sepsis -High sensitive troponin 51.   -Troponin went up to 162  -Likely in setting of demand ischemia  -EKG was unremarkable except for tachycardia, no chest pain  -Continue cardiac monitoring    Microcytic anemia Chronic anemia CKD -Hemoglobin dropped 10-9 in the span of 3 months.  Low MCV 66.3.  Patient does not have any active GI bleed and no history of noticing blood in the stool.  Hemoglobin dropped to 7.5, likely dilutional with IV fluids -Follow CBC in a.m.   DVT of lower extremity -Continue Eliquis 5 mg twice daily.      Essential hypertension -Currently hypotensive in the setting of sepsis.  Holding losartan, Aldactone and hydrochlorothiazide.   Non-insulin-dependent DM type II -A1c 6.5.  Not on any oral regimen at this time.  Diet controlled. - Continue carb modified diet. poc blood glucose check with SSI and at bedtime insulin as needed coverage.   Hyperlipidemia Transaminitis -Elevated AST 61 and ALT 74 and elevated bilirubin 1.3 in the setting of hypotension and sepsis -Holding Lipitor in the setting of transaminitis.    Anion gap metabolic acidosis due to AKI -Resolved   Mild hyponatremia - Resolved with IV fluids      Medications  apixaban  5 mg Oral BID   feeding supplement  237 mL Oral TID BM   ferrous sulfate  325 mg Oral Daily   insulin aspart  0-5 Units  Subcutaneous QHS   insulin aspart  0-6 Units Subcutaneous TID WC   sodium chloride flush  3 mL Intravenous Q12H     Data Reviewed:   CBG:  Recent Labs  Lab 11/03/22 1108 11/03/22 1523 11/03/22 2108 11/04/22 0634 11/04/22 1118  GLUCAP 128* 136* 104* 84 96    SpO2: 97 % O2 Flow Rate (L/min): 0 L/min    Vitals:   11/03/22 1949 11/04/22 0426 11/04/22 0738 11/04/22 1118  BP: 112/68 126/69 118/67   Pulse: (!) 106 81 92   Resp: 20 16 19    Temp: 98.4 F (36.9 C) 98.2 F (36.8 C) 98.1 F (36.7 C) 98 F (36.7 C)  TempSrc: Oral Oral Oral Oral  SpO2: 97% 100% 97%   Weight:  87.8 kg    Height:          Data Reviewed:  Basic Metabolic Panel: Recent Labs  Lab 11/01/22 1915 11/01/22 1921 11/02/22 1438 11/03/22 0328 11/04/22 0350  NA 132* 133* 135 139 137  K 4.0 4.1 3.9 4.1 4.3  CL 97* 102 103 107 106  CO2 17*  --  22 23 24   GLUCOSE 158* 161* 147* 117* 109*  BUN 39* 36* 30* 32* 31*  CREATININE 2.35* 2.40* 1.52* 1.45* 1.09*  CALCIUM 9.2  --  8.7* 8.9 8.9    CBC: Recent Labs  Lab 11/01/22 1915 11/01/22 1921 11/02/22 1438 11/03/22 0328 11/04/22 0350  WBC 26.8*  --  20.0* 12.7* 9.1  NEUTROABS 24.4*  --   --   --   --   HGB 9.0* 10.5* 8.5* 7.6* 7.2*  HCT 28.7* 31.0* 26.3* 23.2* 22.8*  MCV 66.3*  --  64.0* 63.9* 63.5*  PLT 276  --  286 265 270    LFT Recent Labs  Lab 11/01/22 1915 11/02/22 1438 11/03/22 0328 11/04/22 0350  AST 61* 55* 46* 44*  ALT 74* 67* 63* 66*  ALKPHOS 64 65 61 54  BILITOT 1.3* 0.6 0.6 0.4  PROT 7.0 6.3* 5.6* 5.2*  ALBUMIN 2.9* 2.4* 2.0* 2.0*     Antibiotics: Anti-infectives (From admission, onward)    Start     Dose/Rate Route Frequency Ordered Stop   11/02/22 2200  doxycycline (VIBRAMYCIN) 100 mg in sodium chloride 0.9 % 250 mL IVPB  Status:  Discontinued        100 mg 125 mL/hr over 120 Minutes Intravenous Every 12 hours 11/01/22 2122 11/03/22 1359   11/02/22 1600  meropenem (MERREM) 1 g in sodium chloride 0.9 % 100 mL  IVPB        1 g 200 mL/hr over 30 Minutes Intravenous Every 12 hours 11/02/22 1510     11/02/22 0000  cefTRIAXone (ROCEPHIN) 2 g in sodium chloride 0.9 % 100 mL IVPB  Status:  Discontinued        2 g 200 mL/hr over 30 Minutes Intravenous Every 24 hours 11/01/22 2122 11/01/22 2128   11/01/22 2100  cefTRIAXone (ROCEPHIN) 2 g in sodium chloride 0.9 % 100 mL IVPB  Status:  Discontinued        2 g 200 mL/hr over 30 Minutes Intravenous Every 24 hours 11/01/22 2055 11/02/22 1510   11/01/22 2100  azithromycin (ZITHROMAX) 500 mg in sodium chloride 0.9 % 250 mL IVPB  Status:  Discontinued  500 mg 250 mL/hr over 60 Minutes Intravenous Every 24 hours 11/01/22 2055 11/02/22 3875        DVT prophylaxis: Apixaban  Code Status: Full code  Family Communication: Discussed with patient's daughter at bedside   CONSULTS    Subjective   Denies shortness of breath.   Objective    Physical Examination:   General-appears in no acute distress Heart-S1-S2, regular, no murmur auscultated Lungs-clear to auscultation bilaterally, no wheezing or crackles auscultated Abdomen-soft, nontender, no organomegaly Extremities-no edema in the lower extremities Neuro-alert, oriented x3, no focal deficit noted   Status is: Inpatient:             Meredeth Ide   [facility] If 7PM-7AM, please contact night-coverage at [email or web address], Office  [phone]   11/04/2022, 11:21 AM  LOS: 3 days

## 2022-11-05 DIAGNOSIS — N179 Acute kidney failure, unspecified: Secondary | ICD-10-CM | POA: Diagnosis not present

## 2022-11-05 DIAGNOSIS — A419 Sepsis, unspecified organism: Secondary | ICD-10-CM | POA: Diagnosis not present

## 2022-11-05 DIAGNOSIS — W19XXXA Unspecified fall, initial encounter: Secondary | ICD-10-CM | POA: Diagnosis not present

## 2022-11-05 DIAGNOSIS — R4182 Altered mental status, unspecified: Secondary | ICD-10-CM | POA: Diagnosis not present

## 2022-11-05 LAB — COMPREHENSIVE METABOLIC PANEL
ALT: 65 U/L — ABNORMAL HIGH (ref 0–44)
AST: 41 U/L (ref 15–41)
Albumin: 2.1 g/dL — ABNORMAL LOW (ref 3.5–5.0)
Alkaline Phosphatase: 62 U/L (ref 38–126)
Anion gap: 10 (ref 5–15)
BUN: 21 mg/dL (ref 8–23)
CO2: 26 mmol/L (ref 22–32)
Calcium: 9.9 mg/dL (ref 8.9–10.3)
Chloride: 101 mmol/L (ref 98–111)
Creatinine, Ser: 0.91 mg/dL (ref 0.44–1.00)
GFR, Estimated: >60 mL/min (ref >60)
Glucose, Bld: 93 mg/dL (ref 70–99)
Potassium: 4.1 mmol/L (ref 3.5–5.1)
Sodium: 137 mmol/L (ref 135–145)
Total Bilirubin: 0.6 mg/dL (ref 0.3–1.2)
Total Protein: 5.4 g/dL — ABNORMAL LOW (ref 6.5–8.1)

## 2022-11-05 LAB — CBC
HCT: 24.3 % — ABNORMAL LOW (ref 36.0–46.0)
Hemoglobin: 7.9 g/dL — ABNORMAL LOW (ref 12.0–15.0)
MCH: 21.2 pg — ABNORMAL LOW (ref 26.0–34.0)
MCHC: 32.5 g/dL (ref 30.0–36.0)
MCV: 65.1 fL — ABNORMAL LOW (ref 80.0–100.0)
Platelets: 298 10*3/uL (ref 150–400)
RBC: 3.73 MIL/uL — ABNORMAL LOW (ref 3.87–5.11)
RDW: 16.6 % — ABNORMAL HIGH (ref 11.5–15.5)
WBC: 9 10*3/uL (ref 4.0–10.5)
nRBC: 0 % (ref 0.0–0.2)

## 2022-11-05 LAB — GLUCOSE, CAPILLARY
Glucose-Capillary: 86 mg/dL (ref 70–99)
Glucose-Capillary: 92 mg/dL (ref 70–99)
Glucose-Capillary: 111 mg/dL — ABNORMAL HIGH (ref 70–99)
Glucose-Capillary: 97 mg/dL (ref 70–99)

## 2022-11-05 LAB — LEGIONELLA PNEUMOPHILA SEROGP 1 UR AG: L. pneumophila Serogp 1 Ur Ag: NEGATIVE

## 2022-11-05 MED ORDER — CIPROFLOXACIN HCL 500 MG PO TABS: 500.0000 mg | ORAL_TABLET | Freq: Two times a day (BID) | ORAL | 0 refills | Status: AC

## 2022-11-05 MED ORDER — SODIUM CHLORIDE 0.9 % IV SOLN
1.0000 g | Freq: Three times a day (TID) | INTRAVENOUS | Status: DC
  Administered 2022-11-05 – 2022-11-06 (×3): 1 g via INTRAVENOUS
  Filled 2022-11-05 (×5): qty 20

## 2022-11-05 MED ORDER — CIPROFLOXACIN HCL 500 MG PO TABS
500.0000 mg | ORAL_TABLET | Freq: Two times a day (BID) | ORAL | Status: DC
  Administered 2022-11-05: 500 mg via ORAL
  Filled 2022-11-05: qty 1

## 2022-11-05 NOTE — Progress Notes (Signed)
Mobility Specialist Progress Note;   11/05/22 1500  Mobility  Activity Ambulated with assistance in hallway  Level of Assistance Minimal assist, patient does 75% or more  Assistive Device Front wheel walker  Distance Ambulated (ft) 150 ft  Activity Response Tolerated well  Mobility Referral Yes  $Mobility charge 1 Mobility  Mobility Specialist Start Time (ACUTE ONLY) 1500  Mobility Specialist Stop Time (ACUTE ONLY) 1520  Mobility Specialist Time Calculation (min) (ACUTE ONLY) 20 min    During-mobility: HR 122 Post-mobility: HR 91  Pt agreeable to mobility with encouragement. Required minA for STS and minG during ambulation. Asx throughout session. C/o knee and lower back pain post ambulation but stated "it seems to be getting better". Pt back in bed with all needs met. Alarm on.  Caesar Bookman Mobility Specialist Please contact via SecureChat or Rehab Office [phone]

## 2022-11-05 NOTE — TOC Progression Note (Signed)
Transition of Care [facility]) - Progression Note    Patient Details  Name: Tina Bryant MRN: [id number] Date of Birth: 1946-01-05  Transition [facility]) CM/SW Contact  Eduard Roux, [place] Phone Number: 11/05/2022, 1:58 PM  Clinical Narrative:     CSW talked with the patient's granddaughter, Tina Bryant- informed of bed offers- she requested to have CM to call as well to informed of possible outpatient rehab options. Including, she had some medical questions, CSW updated charged nurse.    CM updated   TOC will continue to follow and assist with discharge planning.  Antony Blackbird, MSW, LCSW Clinical Social Worker    Expected Discharge Plan: Skilled Nursing Facility Barriers to Discharge: Continued Medical Work up, SNF Pending bed offer, Insurance Authorization  Expected Discharge Plan and Services In-house Referral: Clinical Social Work Discharge Planning Services: CM Consult Post Acute Care Choice: Home Health, Skilled Nursing Facility Living arrangements for the past 2 months: Single Family Home Expected Discharge Date: 11/05/22               DME Arranged: N/A DME Agency: NA         HH Agency: NA         Social Determinants of Health (SDOH) Interventions SDOH Screenings   Food Insecurity: No Food Insecurity (11/02/2022)  Housing: Low Risk  (11/02/2022)  Transportation Needs: No Transportation Needs (11/02/2022)  Utilities: Not At Risk (11/02/2022)  Tobacco Use: Unknown (11/01/2022)    Readmission Risk Interventions     No data to display

## 2022-11-05 NOTE — TOC Progression Note (Signed)
Transition of Care (TOC) - Progression Note  Donn Pierini RN, BSN Transitions of Care Unit 4E- RN Case Manager See Treatment Team for direct phone #   Patient Details  Name: Tina Bryant MRN: [id number] Date of Birth: [birth year]  [facility]) CM/SW Contact  Zenda Alpers Lenn Sink, RN Phone Number: 11/05/2022, 12:19 PM  Clinical Narrative:    Noted d/c order, pt stable for discharge.  CM in to speak with pt to discuss transition plan- SNF vs HH/outpt.  Per conversation with pt at bedside- pt confirmed she would prefer to return home. Lives alone, voiced she has a family friend that is going to come stay with her some, family also to assist intermittently.  Discussed with pt DME/HH needs. Per pt she voiced that she has rollator, cane, BSC, and shower chair at home (apartment). She has never used [facility]- discussed with pt that her insurance is not in-network with any of the [facility] providers in town and [facility] will not be able to be provided in the home- offered outpt therapy- which pt voiced she felt she might could do but transportation may be difficult.  Pt's grand-daughter to transport home- pt asked to call and speak with her about transition needs/plan.  TC was made to grand-daughter- Tina Bryant.  During phone call- explained to [facility] barriers to [facility] and outpt therapy options. Per Bless family would prefer SNF as they were hoping therapy could come to home (which they now know is not an option), and transportation to outpt would be challenging. Note SNF bed search not completed yet, family asking for SNF bed search in [facility] [place] before final decision is made on where pt is going to SNF vs Home w/ outpt follow up.    Attending MD, CSW and bedside RN updated, will hold d/c for now until SNF bed search completed and final plan determined for safe discharge.   TOC to continue to follow.    Expected Discharge Plan: Skilled Nursing Facility Barriers to  Discharge: Continued Medical Work up, SNF Pending bed offer, English as a second language teacher  Expected Discharge Plan and Services In-house Referral: Clinical Social Work Discharge Planning Services: CM Consult Post Acute Care Choice: Home Health, Skilled Nursing Facility Living arrangements for the past 2 months: Single Family Home Expected Discharge Date: 11/05/22               DME Arranged: N/A DME Agency: NA         HH Agency: NA         Social Determinants of Health (SDOH) Interventions SDOH Screenings   Food Insecurity: No Food Insecurity (11/02/2022)  Housing: Low Risk  (11/02/2022)  Transportation Needs: No Transportation Needs (11/02/2022)  Utilities: Not At Risk (11/02/2022)  Tobacco Use: Unknown (11/01/2022)    Readmission Risk Interventions     No data to display

## 2022-11-05 NOTE — Progress Notes (Signed)
Physical Therapy Treatment Patient Details Name: Tina Bryant MRN: [id number] DOB: [birth year] Today's Date: 11/05/2022   History of Present Illness Pt is 77 year old presented [facility]  [facility] for fall at home with unknown down time and AMS. Pt found to have severe sepsis secondary to PNA. PMH - HTN, CKD, DVT, DM. Pt to have TKA in Dec    PT Comments  Pt received in supine and agreeable to session Pt requires increased time for bed mobility and standing due to increased difficulty advancing BLE. Pt reports no pain, however grimaces with BLE mobility and standing. Pt able to tolerate increased gait distance this session, however continues to be limited by fatigue and DOE. Pt reports feeling more stable with RW vs rollator despite reporting use of rollator at home PTA. Pt continues to benefit from PT services to progress toward functional mobility goals.     If plan is discharge home, recommend the following: A little help with walking and/or transfers;A little help with bathing/dressing/bathroom;Assistance with cooking/housework;Assist for transportation;Help with stairs or ramp for entrance   Can travel by private vehicle     Yes  Equipment Recommendations  None recommended by PT    Recommendations for Other Services       Precautions / Restrictions Precautions Precautions: Fall Precaution Comments: pt fell at home Restrictions Weight Bearing Restrictions: No     Mobility  Bed Mobility Overal bed mobility: Needs Assistance Bed Mobility: Supine to Sit     Supine to sit: HOB elevated, Min assist     General bed mobility comments: increased time and light min A to unweight RLE for pt to advance to EOB    Transfers Overall transfer level: Needs assistance Equipment used: Rollator (4 wheels) Transfers: Sit to/from Stand Sit to Stand: Min assist           General transfer comment: light min A for power up and cues for hand placement     Ambulation/Gait Ambulation/Gait assistance: Contact guard assist Gait Distance (Feet): 150 Feet Assistive device: Rollator (4 wheels) Gait Pattern/deviations: Step-through pattern, Decreased stride length, Trunk flexed Gait velocity: decr     General Gait Details: slow gait with CGA for safety. Pt reports preferring RW over rollator for increased support. Increased instability during turns and DOE noted       Balance Overall balance assessment: Needs assistance Sitting-balance support: Feet supported Sitting balance-Leahy Scale: Good Sitting balance - Comments: sitting EOB   Standing balance support: Bilateral upper extremity supported, Reliant on assistive device for balance, During functional activity Standing balance-Leahy Scale: Poor Standing balance comment: with rollator support                            Cognition Arousal: Alert Behavior During Therapy: WFL for tasks assessed/performed Overall Cognitive Status: Within Functional Limits for tasks assessed                                          Exercises      General Comments General comments (skin integrity, edema, etc.): HR up to 130 with ambulation      Pertinent Vitals/Pain Pain Assessment Pain Assessment: No/denies pain     PT Goals (current goals can now be found in the care plan section) Acute Rehab PT Goals Patient Stated Goal: return home PT Goal Formulation: With patient Time  For Goal Achievement: 11/16/22 Progress towards PT goals: Progressing toward goals    Frequency    Min 1X/week       AM-PAC PT "6 Clicks" Mobility   Outcome Measure  Help needed turning from your back to your side while in a flat bed without using bedrails?: A Little Help needed moving from lying on your back to sitting on the side of a flat bed without using bedrails?: A Little Help needed moving to and from a bed to a chair (including a wheelchair)?: A Little Help needed standing  up from a chair using your arms (e.g., wheelchair or bedside chair)?: A Little Help needed to walk in hospital room?: A Little Help needed climbing 3-5 steps with a railing? : Total 6 Click Score: 16    End of Session Equipment Utilized During Treatment: Gait belt Activity Tolerance: Patient tolerated treatment well;Patient limited by fatigue Patient left: in chair;with call bell/phone within reach Nurse Communication: Mobility status PT Visit Diagnosis: Other abnormalities of gait and mobility (R26.89);Muscle weakness (generalized) (M62.81);History of falling (Z91.81);Pain     Time: 0272-5366 PT Time Calculation (min) (ACUTE ONLY): 26 min  Charges:    $Gait Training: 23-37 mins PT General Charges $$ ACUTE PT VISIT: 1 Visit                     Johny Shock, PTA Acute Rehabilitation Services Secure Chat Preferred  [phone]    Johny Shock 11/05/2022, 3:16 PM

## 2022-11-05 NOTE — Discharge Summary (Signed)
Physician Discharge Summary   Patient: Tina Bryant MRN: [id number] DOB: 1945-07-20  Admit date:     11/01/2022  Discharge date: 11/05/22  Discharge Physician: Meredeth Ide   PCP: Felix Pacini, FNP   Recommendations at discharge:    Follow up PCP in 2 weeks  Discharge Diagnoses: Principal Problem:   Severe sepsis [facility]) Active Problems:   Fall at home, initial encounter   CAP (community acquired pneumonia)   Acute metabolic encephalopathy   CKD (chronic kidney disease) stage 3, GFR 30-59 ml/min (HCC)   DVT (deep venous thrombosis) (HCC)   Hyperlipidemia   Microcytic anemia   Essential hypertension   AKI (acute kidney injury) (HCC)   Elevated troponin   Non-insulin treated type 2 diabetes mellitus (HCC)   High anion gap metabolic acidosis  Resolved Problems:   * No resolved hospital problems. *  Hospital Course:  77 y.o. female with medical history significant of DVT of the lower extremity, essential hypertension, CKD stage 3B, dyslipidemia and DM type II presented to emergency department with complaining of fall and altered mental status. Patient here via EMS secondary to fall.  She is unclear surrounding circumstances of her fall.  She remembers walking to her back room to adjust the window and then woke up on the floor, she is unsure if she had head injury and loss consciousness.  CT head: No acute intracranial hemorrhage.  CT chest abdomen: IMPRESSION: 1. No acute intrathoracic or intra-abdominal injury. 2. Mild nodular ground-glass pulmonary infiltrate within the right apex, possibly infectious or inflammatory in nature. 3. 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 No follow-up imaging is recommended for this lesion. 4. Mild to moderate pancolonic diverticulosis, most severe within the sigmoid colon. Patient admitted with working diagnosis of pneumonia/sepsis, due to leukocytosis, lactic acidosis  Assessment and Plan:  Community-acquired pneumonia Severe  sepsis secondary to pneumonia -Patient presented after a fall.  Denies passing out. -In the ED was found to have leukocytosis with WBC 26.8 -She was tachycardic with heart rate 110, hypotension 99/58, lactic acidosis 2.4, CT chest showed evidence of pneumonia -Initially started on ceftriaxone and doxycycline -Blood culture growing E. Coli, sensitive to Cipro -Antibiotics changed to meropenem -Procalcitonin 6.04    E. coli bacteremia -Blood culture growing ESBL E. Coli -Antibiotics changed to meropenem -Urine culture showed no growth  -Will dc on Cipro 500 mg po bid x 10 days, discussed with ID  Chronic back pain -Gets steroid injections at the spine clinic as outpatient -CT of the lumbar spine done on admission was unremarkable -Discussed with ID, low likelihood of discitis; due to normal CT of the lumbar spine on admission   Mechanical fall -Patient denies passing out at home   Acute metabolic encephalo pathy -resolved   Acute kidney injury on CKD 3B -Creatinine 2.4.  Baseline creatinine is around 1 and GFR between 1-45. - Acute kidney injury in the setting of hypotension. -Creatinine has improved to 1.09 with IV fluids - Will discontinue IV fluids - Monitor urine output, renal function and avoid nephrotoxic agent. -Holding losartan, Aldactone and hydrochlorothiazide. - Will discontinue Aldactazide   Elevated troponin secondary to demand ischemia from sepsis -High sensitive troponin 51.   -Troponin went up to 162  -Likely in setting of demand ischemia  -Troponin was down trending with level 83 -EKG was unremarkable except for tachycardia, no chest pain  -Continue cardiac monitoring    Microcytic anemia Chronic anemia CKD -Hemoglobin dropped 10-9 in the span of 3 months.  Physician Discharge Summary   Patient: Tina Bryant MRN: [id number] DOB: 1945-07-20  Admit date:     11/01/2022  Discharge date: 11/05/22  Discharge Physician: Meredeth Ide   PCP: Felix Pacini, FNP   Recommendations at discharge:    Follow up PCP in 2 weeks  Discharge Diagnoses: Principal Problem:   Severe sepsis [facility]) Active Problems:   Fall at home, initial encounter   CAP (community acquired pneumonia)   Acute metabolic encephalopathy   CKD (chronic kidney disease) stage 3, GFR 30-59 ml/min (HCC)   DVT (deep venous thrombosis) (HCC)   Hyperlipidemia   Microcytic anemia   Essential hypertension   AKI (acute kidney injury) (HCC)   Elevated troponin   Non-insulin treated type 2 diabetes mellitus (HCC)   High anion gap metabolic acidosis  Resolved Problems:   * No resolved hospital problems. *  Hospital Course:  77 y.o. female with medical history significant of DVT of the lower extremity, essential hypertension, CKD stage 3B, dyslipidemia and DM type II presented to emergency department with complaining of fall and altered mental status. Patient here via EMS secondary to fall.  She is unclear surrounding circumstances of her fall.  She remembers walking to her back room to adjust the window and then woke up on the floor, she is unsure if she had head injury and loss consciousness.  CT head: No acute intracranial hemorrhage.  CT chest abdomen: IMPRESSION: 1. No acute intrathoracic or intra-abdominal injury. 2. Mild nodular ground-glass pulmonary infiltrate within the right apex, possibly infectious or inflammatory in nature. 3. 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 No follow-up imaging is recommended for this lesion. 4. Mild to moderate pancolonic diverticulosis, most severe within the sigmoid colon. Patient admitted with working diagnosis of pneumonia/sepsis, due to leukocytosis, lactic acidosis  Assessment and Plan:  Community-acquired pneumonia Severe  sepsis secondary to pneumonia -Patient presented after a fall.  Denies passing out. -In the ED was found to have leukocytosis with WBC 26.8 -She was tachycardic with heart rate 110, hypotension 99/58, lactic acidosis 2.4, CT chest showed evidence of pneumonia -Initially started on ceftriaxone and doxycycline -Blood culture growing E. Coli, sensitive to Cipro -Antibiotics changed to meropenem -Procalcitonin 6.04    E. coli bacteremia -Blood culture growing ESBL E. Coli -Antibiotics changed to meropenem -Urine culture showed no growth  -Will dc on Cipro 500 mg po bid x 10 days, discussed with ID  Chronic back pain -Gets steroid injections at the spine clinic as outpatient -CT of the lumbar spine done on admission was unremarkable -Discussed with ID, low likelihood of discitis; due to normal CT of the lumbar spine on admission   Mechanical fall -Patient denies passing out at home   Acute metabolic encephalo pathy -resolved   Acute kidney injury on CKD 3B -Creatinine 2.4.  Baseline creatinine is around 1 and GFR between 1-45. - Acute kidney injury in the setting of hypotension. -Creatinine has improved to 1.09 with IV fluids - Will discontinue IV fluids - Monitor urine output, renal function and avoid nephrotoxic agent. -Holding losartan, Aldactone and hydrochlorothiazide. - Will discontinue Aldactazide   Elevated troponin secondary to demand ischemia from sepsis -High sensitive troponin 51.   -Troponin went up to 162  -Likely in setting of demand ischemia  -Troponin was down trending with level 83 -EKG was unremarkable except for tachycardia, no chest pain  -Continue cardiac monitoring    Microcytic anemia Chronic anemia CKD -Hemoglobin dropped 10-9 in the span of 3 months.  results of significant diagnostics from this hospitalization (including imaging, microbiology, ancillary and laboratory) are listed below for reference.   Imaging Studies: ECHOCARDIOGRAM COMPLETE  Result Date: 11/02/2022    ECHOCARDIOGRAM REPORT   Patient Name:   Tina Bryant Date of Exam: 11/02/2022 Medical Rec #:  [id number]       Height:       66.0 in Accession #:    [id number]      Weight:       198.0 lb Date of Birth:  22-Feb-1945      BSA:          1.991 m Patient Age:    76 years        BP:           106/57 mmHg Patient Gender: F               HR:           92 bpm. Exam Location:  Inpatient Procedure: 2D Echo, Cardiac Doppler and Color Doppler Indications:    syncope  History:        Patient has no prior history of Echocardiogram examinations.                 Chronic kidney disease; Risk Factors:Hypertension, Dyslipidemia                 and Diabetes.  Sonographer:    Delcie Roch RDCS Referring Phys: [id number] SUBRINA SUNDIL  Sonographer Comments: Patient is obese. Image acquisition challenging due to patient body habitus. IMPRESSIONS  1. Left ventricular ejection fraction, by estimation, is 60 to 65%. The left ventricle has normal function. The left ventricle has no regional wall motion abnormalities. Left ventricular diastolic parameters were normal.  2. Right ventricular systolic function is normal. The right ventricular size is normal.  3. The mitral valve is normal in structure. Mild mitral valve regurgitation. No evidence of mitral stenosis.  4. The aortic valve is tricuspid. Aortic valve regurgitation is not visualized. Aortic valve sclerosis is present, with no evidence of aortic valve stenosis. Comparison(s): No prior Echocardiogram. FINDINGS  Left Ventricle: Left ventricular ejection fraction, by estimation, is 60 to 65%. The left ventricle has normal function. The left ventricle has no regional wall motion abnormalities. The left ventricular internal cavity size was normal in size. There is  borderline left ventricular hypertrophy. Left ventricular diastolic parameters were normal. Right Ventricle: The right ventricular size is normal. No increase in right ventricular wall thickness. Right ventricular systolic function is normal. Left Atrium: Left atrial size was normal in size. Right Atrium: Right atrial size was normal in size. Pericardium: There is no evidence of pericardial effusion. Mitral Valve: The mitral valve is normal in structure. Mild mitral valve regurgitation. No evidence of mitral valve stenosis. Tricuspid Valve: The tricuspid valve is normal in structure. Tricuspid valve regurgitation is trivial. No evidence of tricuspid stenosis. Aortic Valve: The aortic valve is tricuspid. Aortic valve regurgitation is not visualized. Aortic valve sclerosis is present, with no evidence of aortic valve stenosis. Pulmonic Valve: The pulmonic valve was normal in structure.  Pulmonic valve regurgitation is trivial. No evidence of pulmonic stenosis. Aorta: The aortic root and ascending aorta are structurally normal, with no evidence of dilitation. IAS/Shunts: There is redundancy of the interatrial septum. The atrial septum is grossly normal.  LEFT VENTRICLE PLAX 2D LVIDd:         3.80 cm   Diastology LVIDs:  Physician Discharge Summary   Patient: Tina Bryant MRN: [id number] DOB: 1945-07-20  Admit date:     11/01/2022  Discharge date: 11/05/22  Discharge Physician: Meredeth Ide   PCP: Felix Pacini, FNP   Recommendations at discharge:    Follow up PCP in 2 weeks  Discharge Diagnoses: Principal Problem:   Severe sepsis [facility]) Active Problems:   Fall at home, initial encounter   CAP (community acquired pneumonia)   Acute metabolic encephalopathy   CKD (chronic kidney disease) stage 3, GFR 30-59 ml/min (HCC)   DVT (deep venous thrombosis) (HCC)   Hyperlipidemia   Microcytic anemia   Essential hypertension   AKI (acute kidney injury) (HCC)   Elevated troponin   Non-insulin treated type 2 diabetes mellitus (HCC)   High anion gap metabolic acidosis  Resolved Problems:   * No resolved hospital problems. *  Hospital Course:  77 y.o. female with medical history significant of DVT of the lower extremity, essential hypertension, CKD stage 3B, dyslipidemia and DM type II presented to emergency department with complaining of fall and altered mental status. Patient here via EMS secondary to fall.  She is unclear surrounding circumstances of her fall.  She remembers walking to her back room to adjust the window and then woke up on the floor, she is unsure if she had head injury and loss consciousness.  CT head: No acute intracranial hemorrhage.  CT chest abdomen: IMPRESSION: 1. No acute intrathoracic or intra-abdominal injury. 2. Mild nodular ground-glass pulmonary infiltrate within the right apex, possibly infectious or inflammatory in nature. 3. 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 No follow-up imaging is recommended for this lesion. 4. Mild to moderate pancolonic diverticulosis, most severe within the sigmoid colon. Patient admitted with working diagnosis of pneumonia/sepsis, due to leukocytosis, lactic acidosis  Assessment and Plan:  Community-acquired pneumonia Severe  sepsis secondary to pneumonia -Patient presented after a fall.  Denies passing out. -In the ED was found to have leukocytosis with WBC 26.8 -She was tachycardic with heart rate 110, hypotension 99/58, lactic acidosis 2.4, CT chest showed evidence of pneumonia -Initially started on ceftriaxone and doxycycline -Blood culture growing E. Coli, sensitive to Cipro -Antibiotics changed to meropenem -Procalcitonin 6.04    E. coli bacteremia -Blood culture growing ESBL E. Coli -Antibiotics changed to meropenem -Urine culture showed no growth  -Will dc on Cipro 500 mg po bid x 10 days, discussed with ID  Chronic back pain -Gets steroid injections at the spine clinic as outpatient -CT of the lumbar spine done on admission was unremarkable -Discussed with ID, low likelihood of discitis; due to normal CT of the lumbar spine on admission   Mechanical fall -Patient denies passing out at home   Acute metabolic encephalo pathy -resolved   Acute kidney injury on CKD 3B -Creatinine 2.4.  Baseline creatinine is around 1 and GFR between 1-45. - Acute kidney injury in the setting of hypotension. -Creatinine has improved to 1.09 with IV fluids - Will discontinue IV fluids - Monitor urine output, renal function and avoid nephrotoxic agent. -Holding losartan, Aldactone and hydrochlorothiazide. - Will discontinue Aldactazide   Elevated troponin secondary to demand ischemia from sepsis -High sensitive troponin 51.   -Troponin went up to 162  -Likely in setting of demand ischemia  -Troponin was down trending with level 83 -EKG was unremarkable except for tachycardia, no chest pain  -Continue cardiac monitoring    Microcytic anemia Chronic anemia CKD -Hemoglobin dropped 10-9 in the span of 3 months.  results of significant diagnostics from this hospitalization (including imaging, microbiology, ancillary and laboratory) are listed below for reference.   Imaging Studies: ECHOCARDIOGRAM COMPLETE  Result Date: 11/02/2022    ECHOCARDIOGRAM REPORT   Patient Name:   Tina Bryant Date of Exam: 11/02/2022 Medical Rec #:  [id number]       Height:       66.0 in Accession #:    [id number]      Weight:       198.0 lb Date of Birth:  22-Feb-1945      BSA:          1.991 m Patient Age:    76 years        BP:           106/57 mmHg Patient Gender: F               HR:           92 bpm. Exam Location:  Inpatient Procedure: 2D Echo, Cardiac Doppler and Color Doppler Indications:    syncope  History:        Patient has no prior history of Echocardiogram examinations.                 Chronic kidney disease; Risk Factors:Hypertension, Dyslipidemia                 and Diabetes.  Sonographer:    Delcie Roch RDCS Referring Phys: [id number] SUBRINA SUNDIL  Sonographer Comments: Patient is obese. Image acquisition challenging due to patient body habitus. IMPRESSIONS  1. Left ventricular ejection fraction, by estimation, is 60 to 65%. The left ventricle has normal function. The left ventricle has no regional wall motion abnormalities. Left ventricular diastolic parameters were normal.  2. Right ventricular systolic function is normal. The right ventricular size is normal.  3. The mitral valve is normal in structure. Mild mitral valve regurgitation. No evidence of mitral stenosis.  4. The aortic valve is tricuspid. Aortic valve regurgitation is not visualized. Aortic valve sclerosis is present, with no evidence of aortic valve stenosis. Comparison(s): No prior Echocardiogram. FINDINGS  Left Ventricle: Left ventricular ejection fraction, by estimation, is 60 to 65%. The left ventricle has normal function. The left ventricle has no regional wall motion abnormalities. The left ventricular internal cavity size was normal in size. There is  borderline left ventricular hypertrophy. Left ventricular diastolic parameters were normal. Right Ventricle: The right ventricular size is normal. No increase in right ventricular wall thickness. Right ventricular systolic function is normal. Left Atrium: Left atrial size was normal in size. Right Atrium: Right atrial size was normal in size. Pericardium: There is no evidence of pericardial effusion. Mitral Valve: The mitral valve is normal in structure. Mild mitral valve regurgitation. No evidence of mitral valve stenosis. Tricuspid Valve: The tricuspid valve is normal in structure. Tricuspid valve regurgitation is trivial. No evidence of tricuspid stenosis. Aortic Valve: The aortic valve is tricuspid. Aortic valve regurgitation is not visualized. Aortic valve sclerosis is present, with no evidence of aortic valve stenosis. Pulmonic Valve: The pulmonic valve was normal in structure.  Pulmonic valve regurgitation is trivial. No evidence of pulmonic stenosis. Aorta: The aortic root and ascending aorta are structurally normal, with no evidence of dilitation. IAS/Shunts: There is redundancy of the interatrial septum. The atrial septum is grossly normal.  LEFT VENTRICLE PLAX 2D LVIDd:         3.80 cm   Diastology LVIDs:  Physician Discharge Summary   Patient: Tina Bryant MRN: [id number] DOB: 1945-07-20  Admit date:     11/01/2022  Discharge date: 11/05/22  Discharge Physician: Meredeth Ide   PCP: Felix Pacini, FNP   Recommendations at discharge:    Follow up PCP in 2 weeks  Discharge Diagnoses: Principal Problem:   Severe sepsis [facility]) Active Problems:   Fall at home, initial encounter   CAP (community acquired pneumonia)   Acute metabolic encephalopathy   CKD (chronic kidney disease) stage 3, GFR 30-59 ml/min (HCC)   DVT (deep venous thrombosis) (HCC)   Hyperlipidemia   Microcytic anemia   Essential hypertension   AKI (acute kidney injury) (HCC)   Elevated troponin   Non-insulin treated type 2 diabetes mellitus (HCC)   High anion gap metabolic acidosis  Resolved Problems:   * No resolved hospital problems. *  Hospital Course:  77 y.o. female with medical history significant of DVT of the lower extremity, essential hypertension, CKD stage 3B, dyslipidemia and DM type II presented to emergency department with complaining of fall and altered mental status. Patient here via EMS secondary to fall.  She is unclear surrounding circumstances of her fall.  She remembers walking to her back room to adjust the window and then woke up on the floor, she is unsure if she had head injury and loss consciousness.  CT head: No acute intracranial hemorrhage.  CT chest abdomen: IMPRESSION: 1. No acute intrathoracic or intra-abdominal injury. 2. Mild nodular ground-glass pulmonary infiltrate within the right apex, possibly infectious or inflammatory in nature. 3. 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 No follow-up imaging is recommended for this lesion. 4. Mild to moderate pancolonic diverticulosis, most severe within the sigmoid colon. Patient admitted with working diagnosis of pneumonia/sepsis, due to leukocytosis, lactic acidosis  Assessment and Plan:  Community-acquired pneumonia Severe  sepsis secondary to pneumonia -Patient presented after a fall.  Denies passing out. -In the ED was found to have leukocytosis with WBC 26.8 -She was tachycardic with heart rate 110, hypotension 99/58, lactic acidosis 2.4, CT chest showed evidence of pneumonia -Initially started on ceftriaxone and doxycycline -Blood culture growing E. Coli, sensitive to Cipro -Antibiotics changed to meropenem -Procalcitonin 6.04    E. coli bacteremia -Blood culture growing ESBL E. Coli -Antibiotics changed to meropenem -Urine culture showed no growth  -Will dc on Cipro 500 mg po bid x 10 days, discussed with ID  Chronic back pain -Gets steroid injections at the spine clinic as outpatient -CT of the lumbar spine done on admission was unremarkable -Discussed with ID, low likelihood of discitis; due to normal CT of the lumbar spine on admission   Mechanical fall -Patient denies passing out at home   Acute metabolic encephalo pathy -resolved   Acute kidney injury on CKD 3B -Creatinine 2.4.  Baseline creatinine is around 1 and GFR between 1-45. - Acute kidney injury in the setting of hypotension. -Creatinine has improved to 1.09 with IV fluids - Will discontinue IV fluids - Monitor urine output, renal function and avoid nephrotoxic agent. -Holding losartan, Aldactone and hydrochlorothiazide. - Will discontinue Aldactazide   Elevated troponin secondary to demand ischemia from sepsis -High sensitive troponin 51.   -Troponin went up to 162  -Likely in setting of demand ischemia  -Troponin was down trending with level 83 -EKG was unremarkable except for tachycardia, no chest pain  -Continue cardiac monitoring    Microcytic anemia Chronic anemia CKD -Hemoglobin dropped 10-9 in the span of 3 months.  Physician Discharge Summary   Patient: Tina Bryant MRN: [id number] DOB: 1945-07-20  Admit date:     11/01/2022  Discharge date: 11/05/22  Discharge Physician: Meredeth Ide   PCP: Felix Pacini, FNP   Recommendations at discharge:    Follow up PCP in 2 weeks  Discharge Diagnoses: Principal Problem:   Severe sepsis [facility]) Active Problems:   Fall at home, initial encounter   CAP (community acquired pneumonia)   Acute metabolic encephalopathy   CKD (chronic kidney disease) stage 3, GFR 30-59 ml/min (HCC)   DVT (deep venous thrombosis) (HCC)   Hyperlipidemia   Microcytic anemia   Essential hypertension   AKI (acute kidney injury) (HCC)   Elevated troponin   Non-insulin treated type 2 diabetes mellitus (HCC)   High anion gap metabolic acidosis  Resolved Problems:   * No resolved hospital problems. *  Hospital Course:  77 y.o. female with medical history significant of DVT of the lower extremity, essential hypertension, CKD stage 3B, dyslipidemia and DM type II presented to emergency department with complaining of fall and altered mental status. Patient here via EMS secondary to fall.  She is unclear surrounding circumstances of her fall.  She remembers walking to her back room to adjust the window and then woke up on the floor, she is unsure if she had head injury and loss consciousness.  CT head: No acute intracranial hemorrhage.  CT chest abdomen: IMPRESSION: 1. No acute intrathoracic or intra-abdominal injury. 2. Mild nodular ground-glass pulmonary infiltrate within the right apex, possibly infectious or inflammatory in nature. 3. 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 No follow-up imaging is recommended for this lesion. 4. Mild to moderate pancolonic diverticulosis, most severe within the sigmoid colon. Patient admitted with working diagnosis of pneumonia/sepsis, due to leukocytosis, lactic acidosis  Assessment and Plan:  Community-acquired pneumonia Severe  sepsis secondary to pneumonia -Patient presented after a fall.  Denies passing out. -In the ED was found to have leukocytosis with WBC 26.8 -She was tachycardic with heart rate 110, hypotension 99/58, lactic acidosis 2.4, CT chest showed evidence of pneumonia -Initially started on ceftriaxone and doxycycline -Blood culture growing E. Coli, sensitive to Cipro -Antibiotics changed to meropenem -Procalcitonin 6.04    E. coli bacteremia -Blood culture growing ESBL E. Coli -Antibiotics changed to meropenem -Urine culture showed no growth  -Will dc on Cipro 500 mg po bid x 10 days, discussed with ID  Chronic back pain -Gets steroid injections at the spine clinic as outpatient -CT of the lumbar spine done on admission was unremarkable -Discussed with ID, low likelihood of discitis; due to normal CT of the lumbar spine on admission   Mechanical fall -Patient denies passing out at home   Acute metabolic encephalo pathy -resolved   Acute kidney injury on CKD 3B -Creatinine 2.4.  Baseline creatinine is around 1 and GFR between 1-45. - Acute kidney injury in the setting of hypotension. -Creatinine has improved to 1.09 with IV fluids - Will discontinue IV fluids - Monitor urine output, renal function and avoid nephrotoxic agent. -Holding losartan, Aldactone and hydrochlorothiazide. - Will discontinue Aldactazide   Elevated troponin secondary to demand ischemia from sepsis -High sensitive troponin 51.   -Troponin went up to 162  -Likely in setting of demand ischemia  -Troponin was down trending with level 83 -EKG was unremarkable except for tachycardia, no chest pain  -Continue cardiac monitoring    Microcytic anemia Chronic anemia CKD -Hemoglobin dropped 10-9 in the span of 3 months.  results of significant diagnostics from this hospitalization (including imaging, microbiology, ancillary and laboratory) are listed below for reference.   Imaging Studies: ECHOCARDIOGRAM COMPLETE  Result Date: 11/02/2022    ECHOCARDIOGRAM REPORT   Patient Name:   Tina Bryant Date of Exam: 11/02/2022 Medical Rec #:  [id number]       Height:       66.0 in Accession #:    [id number]      Weight:       198.0 lb Date of Birth:  22-Feb-1945      BSA:          1.991 m Patient Age:    76 years        BP:           106/57 mmHg Patient Gender: F               HR:           92 bpm. Exam Location:  Inpatient Procedure: 2D Echo, Cardiac Doppler and Color Doppler Indications:    syncope  History:        Patient has no prior history of Echocardiogram examinations.                 Chronic kidney disease; Risk Factors:Hypertension, Dyslipidemia                 and Diabetes.  Sonographer:    Delcie Roch RDCS Referring Phys: [id number] SUBRINA SUNDIL  Sonographer Comments: Patient is obese. Image acquisition challenging due to patient body habitus. IMPRESSIONS  1. Left ventricular ejection fraction, by estimation, is 60 to 65%. The left ventricle has normal function. The left ventricle has no regional wall motion abnormalities. Left ventricular diastolic parameters were normal.  2. Right ventricular systolic function is normal. The right ventricular size is normal.  3. The mitral valve is normal in structure. Mild mitral valve regurgitation. No evidence of mitral stenosis.  4. The aortic valve is tricuspid. Aortic valve regurgitation is not visualized. Aortic valve sclerosis is present, with no evidence of aortic valve stenosis. Comparison(s): No prior Echocardiogram. FINDINGS  Left Ventricle: Left ventricular ejection fraction, by estimation, is 60 to 65%. The left ventricle has normal function. The left ventricle has no regional wall motion abnormalities. The left ventricular internal cavity size was normal in size. There is  borderline left ventricular hypertrophy. Left ventricular diastolic parameters were normal. Right Ventricle: The right ventricular size is normal. No increase in right ventricular wall thickness. Right ventricular systolic function is normal. Left Atrium: Left atrial size was normal in size. Right Atrium: Right atrial size was normal in size. Pericardium: There is no evidence of pericardial effusion. Mitral Valve: The mitral valve is normal in structure. Mild mitral valve regurgitation. No evidence of mitral valve stenosis. Tricuspid Valve: The tricuspid valve is normal in structure. Tricuspid valve regurgitation is trivial. No evidence of tricuspid stenosis. Aortic Valve: The aortic valve is tricuspid. Aortic valve regurgitation is not visualized. Aortic valve sclerosis is present, with no evidence of aortic valve stenosis. Pulmonic Valve: The pulmonic valve was normal in structure.  Pulmonic valve regurgitation is trivial. No evidence of pulmonic stenosis. Aorta: The aortic root and ascending aorta are structurally normal, with no evidence of dilitation. IAS/Shunts: There is redundancy of the interatrial septum. The atrial septum is grossly normal.  LEFT VENTRICLE PLAX 2D LVIDd:         3.80 cm   Diastology LVIDs:  Physician Discharge Summary   Patient: Tina Bryant MRN: [id number] DOB: 1945-07-20  Admit date:     11/01/2022  Discharge date: 11/05/22  Discharge Physician: Meredeth Ide   PCP: Felix Pacini, FNP   Recommendations at discharge:    Follow up PCP in 2 weeks  Discharge Diagnoses: Principal Problem:   Severe sepsis [facility]) Active Problems:   Fall at home, initial encounter   CAP (community acquired pneumonia)   Acute metabolic encephalopathy   CKD (chronic kidney disease) stage 3, GFR 30-59 ml/min (HCC)   DVT (deep venous thrombosis) (HCC)   Hyperlipidemia   Microcytic anemia   Essential hypertension   AKI (acute kidney injury) (HCC)   Elevated troponin   Non-insulin treated type 2 diabetes mellitus (HCC)   High anion gap metabolic acidosis  Resolved Problems:   * No resolved hospital problems. *  Hospital Course:  77 y.o. female with medical history significant of DVT of the lower extremity, essential hypertension, CKD stage 3B, dyslipidemia and DM type II presented to emergency department with complaining of fall and altered mental status. Patient here via EMS secondary to fall.  She is unclear surrounding circumstances of her fall.  She remembers walking to her back room to adjust the window and then woke up on the floor, she is unsure if she had head injury and loss consciousness.  CT head: No acute intracranial hemorrhage.  CT chest abdomen: IMPRESSION: 1. No acute intrathoracic or intra-abdominal injury. 2. Mild nodular ground-glass pulmonary infiltrate within the right apex, possibly infectious or inflammatory in nature. 3. 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 No follow-up imaging is recommended for this lesion. 4. Mild to moderate pancolonic diverticulosis, most severe within the sigmoid colon. Patient admitted with working diagnosis of pneumonia/sepsis, due to leukocytosis, lactic acidosis  Assessment and Plan:  Community-acquired pneumonia Severe  sepsis secondary to pneumonia -Patient presented after a fall.  Denies passing out. -In the ED was found to have leukocytosis with WBC 26.8 -She was tachycardic with heart rate 110, hypotension 99/58, lactic acidosis 2.4, CT chest showed evidence of pneumonia -Initially started on ceftriaxone and doxycycline -Blood culture growing E. Coli, sensitive to Cipro -Antibiotics changed to meropenem -Procalcitonin 6.04    E. coli bacteremia -Blood culture growing ESBL E. Coli -Antibiotics changed to meropenem -Urine culture showed no growth  -Will dc on Cipro 500 mg po bid x 10 days, discussed with ID  Chronic back pain -Gets steroid injections at the spine clinic as outpatient -CT of the lumbar spine done on admission was unremarkable -Discussed with ID, low likelihood of discitis; due to normal CT of the lumbar spine on admission   Mechanical fall -Patient denies passing out at home   Acute metabolic encephalo pathy -resolved   Acute kidney injury on CKD 3B -Creatinine 2.4.  Baseline creatinine is around 1 and GFR between 1-45. - Acute kidney injury in the setting of hypotension. -Creatinine has improved to 1.09 with IV fluids - Will discontinue IV fluids - Monitor urine output, renal function and avoid nephrotoxic agent. -Holding losartan, Aldactone and hydrochlorothiazide. - Will discontinue Aldactazide   Elevated troponin secondary to demand ischemia from sepsis -High sensitive troponin 51.   -Troponin went up to 162  -Likely in setting of demand ischemia  -Troponin was down trending with level 83 -EKG was unremarkable except for tachycardia, no chest pain  -Continue cardiac monitoring    Microcytic anemia Chronic anemia CKD -Hemoglobin dropped 10-9 in the span of 3 months.

## 2022-11-05 NOTE — Progress Notes (Signed)
[facility]  PROGRESS NOTE  Tina Bryant NWG:[id number] DOB: 1945-04-29 DOA: 11/01/2022 PCP: Felix Pacini, FNP   Brief HPI:   77 y.o. female with medical history significant of DVT of the lower extremity, essential hypertension, CKD stage 3B, dyslipidemia and DM type II presented to emergency department with complaining of fall and altered mental status. Patient here via EMS secondary to fall.  She is unclear surrounding circumstances of her fall.  She remembers walking to her back room to adjust the window and then woke up on the floor, she is unsure if she had head injury and loss consciousness.  CT head: No acute intracranial hemorrhage.  CT chest abdomen: IMPRESSION: 1. No acute intrathoracic or intra-abdominal injury. 2. Mild nodular ground-glass pulmonary infiltrate within the right apex, possibly infectious or inflammatory in nature. 3. 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 No follow-up imaging is recommended for this lesion. 4. Mild to moderate pancolonic diverticulosis, most severe within the sigmoid colon. Patient admitted with working diagnosis of pneumonia/sepsis, due to leukocytosis, lactic acidosis.   Assessment/Plan:   Community-acquired pneumonia Severe sepsis secondary to pneumonia -Patient presented after a fall.  Denies passing out. -In the ED was found to have leukocytosis with WBC 26.8 -She was tachycardic with heart rate 110, hypotension 99/58, lactic acidosis 2.4, CT chest showed evidence of pneumonia -Initially started on ceftriaxone and doxycycline -Blood culture growing E. Coli -Antibiotics changed to meropenem -Procalcitonin 6.04  E. coli bacteremia -Blood culture growing ESBL E. Coli -Antibiotics changed to meropenem -Follow blood culture results -Urine culture showed no growth -Discussed with ID, will discharge on ciprofloxacin 500 mg p.o. twice daily for 10 days  Chronic back pain -Gets steroid injections at the spine clinic as  outpatient -CT of the lumbar spine done on admission was unremarkable -Discussed with ID, low likelihood of discitis; due to normal CT of the lumbar spine on admission  Mechanical fall -Patient denies passing out at home  Acute metabolic encephalo pathy -resolved  Acute kidney injury on CKD 3B -Creatinine 2.4.  Baseline creatinine is around 1 and GFR between 1-45. - Acute kidney injury in the setting of hypotension. -Creatinine has improved to 1.09 with IV fluids - Will discontinue IV fluids - Monitor urine output, renal function and avoid nephrotoxic agent. -Holding losartan, Aldactone and hydrochlorothiazide.   Elevated troponin secondary to demand ischemia from sepsis -High sensitive troponin 51.   -Troponin went up to 162  -Likely in setting of demand ischemia  -EKG was unremarkable except for tachycardia, no chest pain  -Continue cardiac monitoring    Microcytic anemia Chronic anemia CKD -Hemoglobin dropped 10-9 in the span of 3 months.  Low MCV 66.3.  Patient does not have any active GI bleed and no history of noticing blood in the stool.  Hemoglobin dropped to 7.5, likely dilutional with IV fluids -Follow CBC in a.m.   DVT of lower extremity -Continue Eliquis 5 mg twice daily.      Essential hypertension -Currently hypotensive in the setting of sepsis.  Holding losartan, Aldactone and hydrochlorothiazide.   Non-insulin-dependent DM type II -A1c 6.5.  Not on any oral regimen at this time.  Diet controlled. - Continue carb modified diet. poc blood glucose check with SSI and at bedtime insulin as needed coverage.   Hyperlipidemia Transaminitis -Elevated AST 61 and ALT 74 and elevated bilirubin 1.3 in the setting of hypotension and sepsis -Holding Lipitor in the setting of transaminitis.    Anion gap metabolic acidosis due to AKI -Resolved  Mild hyponatremia - Resolved with IV fluids      Medications     apixaban  5 mg Oral BID   ciprofloxacin  500 mg  Oral BID   feeding supplement  237 mL Oral TID BM   ferrous sulfate  325 mg Oral Daily   insulin aspart  0-5 Units Subcutaneous QHS   insulin aspart  0-6 Units Subcutaneous TID WC   sodium chloride flush  3 mL Intravenous Q12H     Data Reviewed:   CBG:  Recent Labs  Lab 11/04/22 1118 11/04/22 1638 11/04/22 2121 11/05/22 0616 11/05/22 1128  GLUCAP 96 100* 129* 86 92    SpO2: 96 % O2 Flow Rate (L/min): 0 L/min    Vitals:   11/05/22 0430 11/05/22 0617 11/05/22 0806 11/05/22 1126  BP: (!) 109/55  126/61 115/85  Pulse: 92     Resp: 18  13 15   Temp: 98.1 F (36.7 C)  98 F (36.7 C) 98 F (36.7 C)  TempSrc: Oral  Oral Oral  SpO2: 96%     Weight:  89.7 kg    Height:          Data Reviewed:  Basic Metabolic Panel: Recent Labs  Lab 11/01/22 1915 11/01/22 1921 11/02/22 1438 11/03/22 0328 11/04/22 0350 11/05/22 0442  NA 132* 133* 135 139 137 137  K 4.0 4.1 3.9 4.1 4.3 4.1  CL 97* 102 103 107 106 101  CO2 17*  --  22 23 24 26   GLUCOSE 158* 161* 147* 117* 109* 93  BUN 39* 36* 30* 32* 31* 21  CREATININE 2.35* 2.40* 1.52* 1.45* 1.09* 0.91  CALCIUM 9.2  --  8.7* 8.9 8.9 9.9    CBC: Recent Labs  Lab 11/01/22 1915 11/01/22 1921 11/02/22 1438 11/03/22 0328 11/04/22 0350 11/05/22 0442  WBC 26.8*  --  20.0* 12.7* 9.1 9.0  NEUTROABS 24.4*  --   --   --   --   --   HGB 9.0* 10.5* 8.5* 7.6* 7.2* 7.9*  HCT 28.7* 31.0* 26.3* 23.2* 22.8* 24.3*  MCV 66.3*  --  64.0* 63.9* 63.5* 65.1*  PLT 276  --  286 265 270 298    LFT Recent Labs  Lab 11/01/22 1915 11/02/22 1438 11/03/22 0328 11/04/22 0350 11/05/22 0442  AST 61* 55* 46* 44* 41  ALT 74* 67* 63* 66* 65*  ALKPHOS 64 65 61 54 62  BILITOT 1.3* 0.6 0.6 0.4 0.6  PROT 7.0 6.3* 5.6* 5.2* 5.4*  ALBUMIN 2.9* 2.4* 2.0* 2.0* 2.1*     Antibiotics: Anti-infectives (From admission, onward)    Start     Dose/Rate Route Frequency Ordered Stop   11/05/22 1400  meropenem (MERREM) 1 g in sodium chloride 0.9 % 100  mL IVPB        1 g 200 mL/hr over 30 Minutes Intravenous Every 8 hours 11/05/22 1123     11/05/22 1230  ciprofloxacin (CIPRO) tablet 500 mg        500 mg Oral 2 times daily 11/05/22 1137     11/05/22 0000  ciprofloxacin (CIPRO) 500 MG tablet        500 mg Oral 2 times daily 11/05/22 1141 11/15/22 2359   11/02/22 2200  doxycycline (VIBRAMYCIN) 100 mg in sodium chloride 0.9 % 250 mL IVPB  Status:  Discontinued        100 mg 125 mL/hr over 120 Minutes Intravenous Every 12 hours 11/01/22 2122 11/03/22 1359   11/02/22 1600  meropenem (  MERREM) 1 g in sodium chloride 0.9 % 100 mL IVPB  Status:  Discontinued        1 g 200 mL/hr over 30 Minutes Intravenous Every 12 hours 11/02/22 1510 11/05/22 1123   11/02/22 0000  cefTRIAXone (ROCEPHIN) 2 g in sodium chloride 0.9 % 100 mL IVPB  Status:  Discontinued        2 g 200 mL/hr over 30 Minutes Intravenous Every 24 hours 11/01/22 2122 11/01/22 2128   11/01/22 2100  cefTRIAXone (ROCEPHIN) 2 g in sodium chloride 0.9 % 100 mL IVPB  Status:  Discontinued        2 g 200 mL/hr over 30 Minutes Intravenous Every 24 hours 11/01/22 2055 11/02/22 1510   11/01/22 2100  azithromycin (ZITHROMAX) 500 mg in sodium chloride 0.9 % 250 mL IVPB  Status:  Discontinued        500 mg 250 mL/hr over 60 Minutes Intravenous Every 24 hours 11/01/22 2055 11/02/22 0833        DVT prophylaxis: Apixaban  Code Status: Full code  Family Communication: Discussed with patient's daughter at bedside   CONSULTS    Subjective   Denies any complaints, awaiting bed at skilled nursing facility.   Objective    Physical Examination:   Appears in no acute distress S1-S2, regular Lungs clear to auscultation bilaterally   Status is: Inpatient:             Meredeth Ide   [facility] If 7PM-7AM, please contact night-coverage at [email or web address], Office  [phone]   11/05/2022, 2:58 PM  LOS: 4 days

## 2022-11-05 NOTE — TOC Progression Note (Signed)
Transition of Care [facility]) - Progression Note    Patient Details  Name: Tina Bryant MRN: [id number] Date of Birth: [birth year]  Transition of Care [facility]) CM/SW Contact  Eduard Roux, [place] Phone Number: 11/05/2022, 12:46 PM  Clinical Narrative:     CSW informed by CM, patient and family is agreeable to short term rehab at [facility]. Referrals were sent- facility will determine if patient has SNF benefits and will be responsible for the authorization if SNF becomes the plan. Patient has Medicaid which will limit SNF options.  TOC will continue to follow and assist with discharge planning.  Antony Blackbird, MSW, LCSW Clinical Social Worker    Expected Discharge Plan: Skilled Nursing Facility Barriers to Discharge: Continued Medical Work up, SNF Pending bed offer, Insurance Authorization  Expected Discharge Plan and Services In-house Referral: Clinical Social Work Discharge Planning Services: CM Consult Post Acute Care Choice: Home Health, Skilled Nursing Facility Living arrangements for the past 2 months: Single Family Home Expected Discharge Date: 11/05/22               DME Arranged: N/A DME Agency: NA         HH Agency: NA         Social Determinants of Health (SDOH) Interventions SDOH Screenings   Food Insecurity: No Food Insecurity (11/02/2022)  Housing: Low Risk  (11/02/2022)  Transportation Needs: No Transportation Needs (11/02/2022)  Utilities: Not At Risk (11/02/2022)  Tobacco Use: Unknown (11/01/2022)    Readmission Risk Interventions     No data to display

## 2022-11-06 LAB — COMPREHENSIVE METABOLIC PANEL
ALT: 61 U/L — ABNORMAL HIGH (ref 0–44)
AST: 38 U/L (ref 15–41)
Albumin: 2.2 g/dL — ABNORMAL LOW (ref 3.5–5.0)
Alkaline Phosphatase: 57 U/L (ref 38–126)
Anion gap: 9 (ref 5–15)
BUN: 20 mg/dL (ref 8–23)
CO2: 26 mmol/L (ref 22–32)
Calcium: 9.6 mg/dL (ref 8.9–10.3)
Chloride: 103 mmol/L (ref 98–111)
Creatinine, Ser: 1 mg/dL (ref 0.44–1.00)
GFR, Estimated: 57 mL/min — ABNORMAL LOW (ref >60)
Glucose, Bld: 93 mg/dL (ref 70–99)
Potassium: 4.3 mmol/L (ref 3.5–5.1)
Sodium: 138 mmol/L (ref 135–145)
Total Bilirubin: 0.6 mg/dL (ref 0.3–1.2)
Total Protein: 5.8 g/dL — ABNORMAL LOW (ref 6.5–8.1)

## 2022-11-06 LAB — CBC
HCT: 26.7 % — ABNORMAL LOW (ref 36.0–46.0)
Hemoglobin: 8.5 g/dL — ABNORMAL LOW (ref 12.0–15.0)
MCH: 20.9 pg — ABNORMAL LOW (ref 26.0–34.0)
MCHC: 31.8 g/dL (ref 30.0–36.0)
MCV: 65.8 fL — ABNORMAL LOW (ref 80.0–100.0)
Platelets: 286 10*3/uL (ref 150–400)
RBC: 4.06 MIL/uL (ref 3.87–5.11)
RDW: 16.6 % — ABNORMAL HIGH (ref 11.5–15.5)
WBC: 8.8 10*3/uL (ref 4.0–10.5)
nRBC: 0 % (ref 0.0–0.2)

## 2022-11-06 LAB — GLUCOSE, CAPILLARY
Glucose-Capillary: 96 mg/dL (ref 70–99)
Glucose-Capillary: 99 mg/dL (ref 70–99)

## 2022-11-06 NOTE — TOC Progression Note (Addendum)
Transition of Care (TOC) - Progression Note  Donn Pierini RN, BSN Transitions of Care Unit 4E- RN Case Manager See Treatment Team for direct phone #   Patient Details  Name: Anthonia Roper MRN: [id number] Date of Birth: [birth year]  Transition of Care [facility]) CM/SW Contact  Zenda Alpers, Lenn Sink, RN Phone Number: 11/06/2022, 10:49 AM  Clinical Narrative:    Received TC from pt's son- per conversation son voiced that family has decided for pt to return home. Son indicated that they have hired someone to stay with pt 24/7 for the next month and that this person will also be able to assist with taking pt to outpt therapy. Son confirmed that they would like pt to go to outpt therapy - location [facility] outpt therapy for PT/OT needs.   Family will be prepared for pt to return home tomorrow with assistance arranged to start then.  Family will transport home.   CM will follow up in am to place outpt referral to [facility] street location for PT/OT.   Team updated on family decision for transition plan   Expected Discharge Plan: Skilled Nursing Facility Barriers to Discharge: Continued Medical Work up, SNF Pending bed offer, English as a second language teacher  Expected Discharge Plan and Services In-house Referral: Clinical Social Work Discharge Planning Services: CM Consult Post Acute Care Choice: Home Health, Skilled Nursing Facility Living arrangements for the past 2 months: Single Family Home Expected Discharge Date: 11/05/22               DME Arranged: N/A DME Agency: NA         HH Agency: NA         Social Determinants of Health (SDOH) Interventions SDOH Screenings   Food Insecurity: No Food Insecurity (11/02/2022)  Housing: Low Risk  (11/02/2022)  Transportation Needs: No Transportation Needs (11/02/2022)  Utilities: Not At Risk (11/02/2022)  Tobacco Use: Unknown (11/01/2022)    Readmission Risk Interventions     No data to display

## 2022-11-06 NOTE — TOC Transition Note (Addendum)
Transition of Care (TOC) - CM/SW Discharge Note Donn Pierini RN, BSN Transitions of Care Unit 4E- RN Case Manager See Treatment Team for direct phone #   Patient Details  Name: Tina Bryant MRN: [id number] Date of Birth: November 14, 1945  Transition of Care [facility]) CM/SW Contact:  Darrold Span, RN Phone Number: 11/06/2022, 11:07 AM   Clinical Narrative:    Pt stable for transition home today, spoke with son this am who voiced family is ready to transport pt home this am. Per TC w/ son- he voiced that he lives in [place] and is working to have pt move there to be with him. Discussed with son process to change pt's [place] to [place] and to check into Medicare benefits as well. Plan is for pt to stay here for next 30 days  until family has plan to move her to be with son.  Referral has been made to [facility] Outpt therapy - [facility]- Ref # [id number] for PT/OT as per family choice of location. HH could not be secured as no HH agency would accept. Family aware of this and agreeable to outpt therapy, son confirmed arrangements have been made for transportation.   No further TOC needs. Pt to return home w/ support of family and follow AVS instructions for further care.   Update 1400- received msg from OT- pt needs DME-RW for home, as well as family at bedside requesting assist with HCPOA paperwork- bedside RN to contact chaplain for assist with HCPOA needs,  DME order placed for RW- MD notified for co-sign.   Per family they have no preference for DME provider, state that they believe pt purchased rollator themselves. Call made to [employer] liaison for DME need- RW to be delivered to room once processed w/ insurance.    Final next level of care: OP Rehab Barriers to Discharge: Barriers Resolved   Patient Goals and CMS Choice CMS Medicare.gov Compare Post Acute Care list provided to:: Patient Choice offered to / list presented to : Patient Radio broadcast assistant)  Discharge Placement                Home          Discharge Plan and Services Additional resources added to the After Visit Summary for   In-house Referral: Clinical Social Work Discharge Planning Services: CM Consult Post Acute Care Choice: Home Health, Skilled Nursing Facility          DME Arranged: N/A DME Agency: NA         HH Agency: NA        Social Determinants of Health (SDOH) Interventions SDOH Screenings   Food Insecurity: No Food Insecurity (11/02/2022)  Housing: Low Risk  (11/02/2022)  Transportation Needs: No Transportation Needs (11/02/2022)  Utilities: Not At Risk (11/02/2022)  Tobacco Use: Unknown (11/01/2022)     Readmission Risk Interventions    11/06/2022   11:07 AM  Readmission Risk Prevention Plan  Post Dischage Appt Complete  Medication Screening Complete  Transportation Screening Complete

## 2022-11-06 NOTE — Progress Notes (Signed)
This chaplain responded to the unit page for completing Advance Directive-HCPOA. The Pt. is sitting in the bedside recliner at the time of the chaplain's visit. The Pt. friend-[name] is visiting.  T  The Pt. participated in AD education and answered clarifying questions before requesting a notary. The Pt. is completing HCPOA only. Living Will was not completed.  The Pt. named [name] as her healthcare agent. If this person is unable or unwilling to serve in this role the Pt. next choice is [name].   The chaplain is present with the Pt., [name], notary and witnesses for the notarizing of the Pt. HCPOA.  The chaplain gave the Pt. the original AD along with two copies. The chaplain scanned the Pt. AD into the Pt. EMR.  The chaplain is available for F/U spiritual care as needed.  Chaplain Stephanie Acre [phone]

## 2022-11-06 NOTE — Progress Notes (Signed)
Discharge instructions (including medications) discussed with and copy provided to patient/caregiver Patient is waiting on her son to arrange her ride.

## 2022-11-06 NOTE — Plan of Care (Signed)
  Problem: Activity: Goal: Ability to tolerate increased activity will improve Outcome: Adequate for Discharge   Problem: Clinical Measurements: Goal: Ability to maintain a body temperature in the normal range will improve Outcome: Adequate for Discharge   Problem: Respiratory: Goal: Ability to maintain adequate ventilation will improve Outcome: Adequate for Discharge Goal: Ability to maintain a clear airway will improve Outcome: Adequate for Discharge   Problem: Education: Goal: Ability to describe self-care measures that may prevent or decrease complications (Diabetes Survival Skills Education) will improve Outcome: Adequate for Discharge Goal: Individualized Educational Video(s) Outcome: Adequate for Discharge   Problem: Coping: Goal: Ability to adjust to condition or change in health will improve Outcome: Adequate for Discharge   Problem: Fluid Volume: Goal: Ability to maintain a balanced intake and output will improve Outcome: Adequate for Discharge   Problem: Health Behavior/Discharge Planning: Goal: Ability to identify and utilize available resources and services will improve Outcome: Adequate for Discharge Goal: Ability to manage health-related needs will improve Outcome: Adequate for Discharge   Problem: Metabolic: Goal: Ability to maintain appropriate glucose levels will improve Outcome: Adequate for Discharge   Problem: Nutritional: Goal: Maintenance of adequate nutrition will improve Outcome: Adequate for Discharge Goal: Progress toward achieving an optimal weight will improve Outcome: Adequate for Discharge   Problem: Skin Integrity: Goal: Risk for impaired skin integrity will decrease Outcome: Adequate for Discharge   Problem: Tissue Perfusion: Goal: Adequacy of tissue perfusion will improve Outcome: Adequate for Discharge   Problem: Acute Rehab OT Goals (only OT should resolve) Goal: Pt. Will Perform Grooming Outcome: Adequate for Discharge Goal:  Pt. Will Perform Lower Body Bathing Outcome: Adequate for Discharge Goal: Pt. Will Perform Lower Body Dressing Outcome: Adequate for Discharge Goal: OT Additional ADL Goal #1 Outcome: Adequate for Discharge   Problem: Acute Rehab PT Goals(only PT should resolve) Goal: Pt Will Go Supine/Side To Sit Outcome: Adequate for Discharge Goal: Pt Will Go Sit To Supine/Side Outcome: Adequate for Discharge Goal: Patient Will Transfer Sit To/From Stand Outcome: Adequate for Discharge Goal: Pt Will Ambulate Outcome: Adequate for Discharge   Problem: Education: Goal: Knowledge of General Education information will improve Description: Including pain rating scale, medication(s)/side effects and non-pharmacologic comfort measures Outcome: Adequate for Discharge   Problem: Health Behavior/Discharge Planning: Goal: Ability to manage health-related needs will improve Outcome: Adequate for Discharge   Problem: Clinical Measurements: Goal: Ability to maintain clinical measurements within normal limits will improve Outcome: Adequate for Discharge Goal: Will remain free from infection Outcome: Adequate for Discharge Goal: Diagnostic test results will improve Outcome: Adequate for Discharge Goal: Respiratory complications will improve Outcome: Adequate for Discharge Goal: Cardiovascular complication will be avoided Outcome: Adequate for Discharge   Problem: Activity: Goal: Risk for activity intolerance will decrease Outcome: Adequate for Discharge   Problem: Nutrition: Goal: Adequate nutrition will be maintained Outcome: Adequate for Discharge   Problem: Coping: Goal: Level of anxiety will decrease Outcome: Adequate for Discharge   Problem: Elimination: Goal: Will not experience complications related to bowel motility Outcome: Adequate for Discharge Goal: Will not experience complications related to urinary retention Outcome: Adequate for Discharge   Problem: Pain Management: Goal:  General experience of comfort will improve Outcome: Adequate for Discharge   Problem: Safety: Goal: Ability to remain free from injury will improve Outcome: Adequate for Discharge   Problem: Skin Integrity: Goal: Risk for impaired skin integrity will decrease Outcome: Adequate for Discharge

## 2022-11-06 NOTE — Progress Notes (Signed)
Occupational Therapy Treatment Patient Details Name: Tina Bryant MRN: [id number] DOB: Mar 09, 1945 Today's Date: 11/06/2022   History of present illness Pt is 77 year old presented to [facility] on  11/01/22 for fall at home with unknown down time and AMS. Pt found to have severe sepsis secondary to PNA. PMH - HTN, CKD, DVT, DM. Pt to have TKA in Dec   OT comments  Patient scheduled for discharge this date, willing to participate with toileting and dressing task for ride home.  Patient with CGA with sit to stand, and lower body ADL in sit to stand, otherwise supervision for mobility in the room.  Outpatient has been scheduled for post acute rehab, and patient would benefit from 2WRW for home use versus the 4WRW.  Patient is much more stable with the 2WRW.         If plan is discharge home, recommend the following:  A little help with walking and/or transfers;A little help with bathing/dressing/bathroom;Assist for transportation;Help with stairs or ramp for entrance   Equipment Recommendations  2 WRW   Recommendations for Other Services      Precautions / Restrictions Precautions Precaution Comments: Contact precautions Restrictions Weight Bearing Restrictions: No       Mobility Bed Mobility Overal bed mobility: Needs Assistance Bed Mobility: Supine to Sit     Supine to sit: Supervision          Transfers Overall transfer level: Needs assistance Equipment used: Rolling walker (2 wheels) Transfers: Sit to/from Stand, Bed to chair/wheelchair/BSC Sit to Stand: Contact guard assist     Step pivot transfers: Supervision           Balance Overall balance assessment: Needs assistance Sitting-balance support: Feet supported Sitting balance-Leahy Scale: Good     Standing balance support: Reliant on assistive device for balance Standing balance-Leahy Scale: Poor                             ADL either performed or assessed with clinical judgement   ADL                    Upper Body Dressing : Set up;Sitting   Lower Body Dressing: Supervision/safety;Sit to/from stand   Toilet Transfer: Supervision/safety;Rolling walker (2 wheels)                  Extremity/Trunk Assessment Upper Extremity Assessment Upper Extremity Assessment: Overall WFL for tasks assessed   Lower Extremity Assessment Lower Extremity Assessment: Defer to PT evaluation   Cervical / Trunk Assessment Cervical / Trunk Assessment: Kyphotic    Vision Baseline Vision/History: 1 Wears glasses Patient Visual Report: No change from baseline     Perception Perception Perception: Not tested   Praxis Praxis Praxis: Not tested    Cognition Arousal: Alert Behavior During Therapy: WFL for tasks assessed/performed Overall Cognitive Status: Within Functional Limits for tasks assessed                                                             Pertinent Vitals/ Pain       Pain Assessment Pain Assessment: No/denies pain  Frequency  Min 1X/week        Progress Toward Goals  OT Goals(current goals can now be found in the care plan section)  Progress towards OT goals: Progressing toward goals  Acute Rehab OT Goals OT Goal Formulation: With patient Time For Goal Achievement: 11/16/22 Potential to Achieve Goals: Good  Plan      Co-evaluation                 AM-PAC OT "6 Clicks" Daily Activity     Outcome Measure   Help from another person eating meals?: None Help from another person taking care of personal grooming?: A Little Help from another person toileting, which includes using toliet, bedpan, or urinal?: A Little Help from another person bathing (including washing, rinsing, drying)?: A Little Help from another person to put on and taking off regular upper body clothing?: None Help from another person to put on and taking off  regular lower body clothing?: A Little 6 Click Score: 20    End of Session Equipment Utilized During Treatment: Rolling walker (2 wheels)  OT Visit Diagnosis: Unsteadiness on feet (R26.81)   Activity Tolerance Patient tolerated treatment well   Patient Left in chair;with call bell/phone within reach;with family/visitor present   Nurse Communication Mobility status        Time: 5284-1324 OT Time Calculation (min): 21 min  Charges: OT General Charges $OT Visit: 1 Visit OT Treatments $Self Care/Home Management : 8-22 mins  11/06/2022  RP, OTR/L  Acute Rehabilitation Services  Office:  [phone]   Suzanna Obey 11/06/2022, 2:03 PM

## 2022-11-26 ENCOUNTER — Other Ambulatory Visit: Payer: Self-pay | Admitting: Vascular Surgery

## 2023-01-23 ENCOUNTER — Other Ambulatory Visit: Payer: Self-pay | Admitting: Orthopedic Surgery

## 2023-01-26 NOTE — Patient Instructions (Signed)
SURGICAL WAITING ROOM VISITATION Patients having surgery or a procedure may have no more than 2 support people in the waiting area - these visitors may rotate in the visitor waiting room.   Due to an increase in RSV and influenza rates and associated hospitalizations, children ages 70 and under may not visit patients in [facility] hospitals. If the patient needs to stay at the hospital during part of their recovery, the visitor guidelines for inpatient rooms apply.  PRE-OP VISITATION  Pre-op nurse will coordinate an appropriate time for 1 support person to accompany the patient in pre-op.  This support person may not rotate.  This visitor will be contacted when the time is appropriate for the visitor to come back in the pre-op area.  Please refer to the [facility] website for the visitor guidelines for Inpatients (after your surgery is over and you are in a regular room).  You are not required to quarantine at this time prior to your surgery. However, you must do this: Hand Hygiene often Do NOT share personal items Notify your provider if you are in close contact with someone who has COVID or you develop fever 100.4 or greater, new onset of sneezing, cough, sore throat, shortness of breath or body aches.  If you test positive for Covid or have been in contact with anyone that has tested positive in the last 10 days please notify you surgeon.    Your procedure is scheduled on:  MONDAY   February 04, 2023  Report to [facility] Main Entrance: [facility] entrance where the [employer] is available.   Report to admitting at: 12:15  PM  Call this number if you have any questions or problems the morning of surgery [phone]  Do not eat food after Midnight the night prior to your surgery/procedure.  After Midnight you may have the following liquids until  11:45 AM  DAY OF SURGERY  Clear Liquid Diet Water Black Coffee (sugar ok, NO MILK/CREAM OR CREAMERS)  Tea (sugar ok, NO  MILK/CREAM OR CREAMERS) regular and decaf                             Plain Jell-O  with no fruit (NO RED)                                           Fruit ices (not with fruit pulp, NO RED)                                     Popsicles (NO RED)                                                                  Juice: NO CITRUS JUICES: only apple, WHITE grape, WHITE cranberry Sports drinks like Gatorade or Powerade (NO RED)                     The day of surgery:  Drink ONE (1) Pre-Surgery G2 at    11:45 AM the morning  of surgery. Drink in one sitting. Do not sip.  This drink was given to you during your hospital pre-op appointment visit. Nothing else to drink after completing the Pre-Surgery  G2 : No candy, chewing gum or throat lozenges.    FOLLOW ANY ADDITIONAL PRE OP INSTRUCTIONS YOU RECEIVED FROM YOUR SURGEON'S OFFICE!!!   Oral Hygiene is also important to reduce your risk of infection.        Remember - BRUSH YOUR TEETH THE MORNING OF SURGERY WITH YOUR REGULAR TOOTHPASTE  Do NOT smoke after Midnight the night before surgery.  STOP TAKING all Vitamins, Herbs and supplements 1 week before your surgery.   Take ONLY these medicines the morning of surgery with A SIP OF WATER:  You may take Tylenol if needed for pain.  You may use your Eye drops if needed.                   You may not have any metal on your body including hair pins, jewelry, and body piercing  Do not wear make-up, lotions, powders, perfumes or deodorant  Do not wear nail polish including gel and S&S, artificial / acrylic nails, or any other type of covering on natural nails including finger and toenails. If you have artificial nails, gel coating, etc., that needs to be removed by a nail salon, Please have this removed prior to surgery. Not doing so may mean that your surgery could be cancelled or delayed if the Surgeon or anesthesia staff feels like they are unable to monitor you safely.   Do not shave 48 hours  prior to surgery to avoid nicks in your skin which may contribute to postoperative infections.   Contacts, Hearing Aids, dentures or bridgework may not be worn into surgery. DENTURES WILL BE REMOVED PRIOR TO SURGERY PLEASE DO NOT APPLY "Poly grip" OR ADHESIVES!!!  You may bring a small overnight bag with you on the day of surgery, only pack items that are not valuable. [facility] [place] IS NOT RESPONSIBLE   FOR VALUABLES THAT ARE LOST OR STOLEN.   Do not bring your home medications to the hospital. The Pharmacy will dispense medications listed on your medication list to you during your admission in the Hospital.  Please read over the following fact sheets you were given: IF YOU HAVE QUESTIONS ABOUT YOUR PRE-OP INSTRUCTIONS, PLEASE CALL [phone].     Pre-operative 5 CHG Bath Instructions   You can play a key role in reducing the risk of infection after surgery. Your skin needs to be as free of germs as possible. You can reduce the number of germs on your skin by washing with CHG (chlorhexidine gluconate) soap before surgery. CHG is an antiseptic soap that kills germs and continues to kill germs even after washing.   DO NOT use if you have an allergy to chlorhexidine/CHG or antibacterial soaps. If your skin becomes reddened or irritated, stop using the CHG and notify one of our RNs at [phone]  Please shower with the CHG soap starting 4 days before surgery using the following schedule: START SHOWERS ON   THURSDAY  January 31, 2023  Please keep in mind the following:  DO NOT shave, including legs and underarms, starting the day of your first shower.   You may shave your face at any point before/day of surgery.   Place clean sheets on your bed the day you start using CHG soap. Use a clean washcloth (not used since being  washed) for each shower. DO NOT sleep with pets once you start using the CHG.   CHG Shower Instructions:  If you choose to wash your hair and private area, wash first with your normal shampoo/soap.  After you use shampoo/soap, rinse your hair and body thoroughly to remove shampoo/soap residue.  Turn the water OFF and apply about 3 tablespoons (45 ml) of CHG soap to a CLEAN washcloth.  Apply CHG soap ONLY FROM YOUR NECK DOWN TO YOUR TOES (washing for 3-5 minutes)  DO NOT use CHG soap on face, private areas, open wounds, or sores.  Pay special attention to the area where your surgery is being performed.  If you are having back surgery, having someone wash your back for you may be helpful.  Wait 2 minutes after CHG soap is applied, then you may rinse off the CHG soap.  Pat dry with a clean towel  Put on clean clothes/pajamas   If you choose to wear lotion, please use ONLY the CHG-compatible lotions on the back of this paper.     Additional instructions for the day of surgery: DO NOT APPLY any lotions, deodorants, cologne, or perfumes.   Put on clean/comfortable clothes.  Brush your teeth.  Ask your nurse before applying any prescription medications to the skin.      CHG Compatible Lotions   Aveeno Moisturizing lotion  Cetaphil Moisturizing Cream  Cetaphil Moisturizing Lotion  Clairol Herbal Essence Moisturizing Lotion, Dry Skin  Clairol Herbal Essence Moisturizing Lotion, Extra Dry Skin  Clairol Herbal Essence Moisturizing Lotion, Normal Skin  Curel Age Defying Therapeutic Moisturizing Lotion with Alpha Hydroxy  Curel Extreme Care Body Lotion  Curel Soothing Hands Moisturizing Hand Lotion  Curel Therapeutic Moisturizing Cream, Fragrance-Free  Curel Therapeutic Moisturizing Lotion, Fragrance-Free  Curel Therapeutic Moisturizing Lotion, Original Formula  Eucerin Daily Replenishing Lotion  Eucerin Dry Skin Therapy Plus Alpha Hydroxy Crme  Eucerin Dry Skin Therapy Plus Alpha  Hydroxy Lotion  Eucerin Original Crme  Eucerin Original Lotion  Eucerin Plus Crme Eucerin Plus Lotion  Eucerin TriLipid Replenishing Lotion  Keri Anti-Bacterial Hand Lotion  Keri Deep Conditioning Original Lotion Dry Skin Formula Softly Scented  Keri Deep Conditioning Original Lotion, Fragrance Free Sensitive Skin Formula  Keri Lotion Fast Absorbing Fragrance Free Sensitive Skin Formula  Keri Lotion Fast Absorbing Softly Scented Dry Skin Formula  Keri Original Lotion  Keri Skin Renewal Lotion Keri Silky Smooth Lotion  Keri Silky Smooth Sensitive Skin Lotion  Nivea Body Creamy Conditioning Oil  Nivea Body Extra Enriched Lotion  Nivea Body Original Lotion  Nivea Body Sheer Moisturizing Lotion Nivea Crme  Nivea Skin Firming Lotion  NutraDerm 30 Skin Lotion  NutraDerm Skin Lotion  NutraDerm Therapeutic Skin Cream  NutraDerm Therapeutic Skin Lotion  ProShield Protective Hand Cream  Provon moisturizing lotion   FAILURE TO FOLLOW THESE INSTRUCTIONS MAY RESULT IN THE CANCELLATION OF YOUR SURGERY  PATIENT SIGNATURE_________________________________  NURSE SIGNATURE__________________________________  ________________________________________________________________________      Tina Bryant    An incentive spirometer is a tool that can help keep your lungs clear and active. This tool measures how well you are filling your lungs with each breath. Taking long  deep breaths may help reverse or decrease the chance of developing breathing (pulmonary) problems (especially infection) following: A long period of time when you are unable to move or be active. BEFORE THE PROCEDURE  If the spirometer includes an indicator to show your best effort, your nurse or respiratory therapist will set it to a desired goal. If possible, sit up straight or lean slightly forward. Try not to slouch. Hold the incentive spirometer in an upright position. INSTRUCTIONS FOR USE  Sit on the edge of  your bed if possible, or sit up as far as you can in bed or on a chair. Hold the incentive spirometer in an upright position. Breathe out normally. Place the mouthpiece in your mouth and seal your lips tightly around it. Breathe in slowly and as deeply as possible, raising the piston or the ball toward the top of the column. Hold your breath for 3-5 seconds or for as long as possible. Allow the piston or ball to fall to the bottom of the column. Remove the mouthpiece from your mouth and breathe out normally. Rest for a few seconds and repeat Steps 1 through 7 at least 10 times every 1-2 hours when you are awake. Take your time and take a few normal breaths between deep breaths. The spirometer may include an indicator to show your best effort. Use the indicator as a goal to work toward during each repetition. After each set of 10 deep breaths, practice coughing to be sure your lungs are clear. If you have an incision (the cut made at the time of surgery), support your incision when coughing by placing a pillow or rolled up towels firmly against it. Once you are able to get out of bed, walk around indoors and cough well. You may stop using the incentive spirometer when instructed by your caregiver.  RISKS AND COMPLICATIONS Take your time so you do not get dizzy or light-headed. If you are in pain, you may need to take or ask for pain medication before doing incentive spirometry. It is harder to take a deep breath if you are having pain. AFTER USE Rest and breathe slowly and easily. It can be helpful to keep track of a log of your progress. Your caregiver can provide you with a simple table to help with this. If you are using the spirometer at home, follow these instructions: SEEK MEDICAL CARE IF:  You are having difficultly using the spirometer. You have trouble using the spirometer as often as instructed. Your pain medication is not giving enough relief while using the spirometer. You develop  fever of 100.5 F (38.1 C) or higher.                                                                                                    SEEK IMMEDIATE MEDICAL CARE IF:  You cough up bloody sputum that had not been present before. You develop fever of 102 F (38.9 C) or greater. You develop worsening pain at or near the incision site. MAKE SURE YOU:  Understand these instructions. Will watch your condition. Will  get help right away if you are not doing well or get worse. Document Released: 05/07/2006 Document Revised: 03/19/2011 Document Reviewed: 07/08/2006 [facility] Patient Information 2014 [place], [place].          If you would like to see a video about joint replacement:   [email or web address]

## 2023-01-26 NOTE — Progress Notes (Signed)
COVID Vaccine received:  []  No [x]  Yes Date of any COVID positive Test in last 90 days:  PCP - Newton Pigg, FNP ??? Pain mgmt Cardiologist - none  Chest x-ray - 10-12-2022 2v  Epic EKG -  10-12-2022  Epic Stress Test - 09-12-2019 ECHO - 11-02-2022  Epic Cardiac Cath -   PCR screen: [x]  Ordered & Completed []   No Order but Needs PROFEND     []   N/A for this surgery  Surgery Plan:  []  Ambulatory   [x]  Outpatient in bed  []  Admit Anesthesia:    []  General  [x]  Spinal  []   Choice []   MAC  Pacemaker / ICD device [x]  No []  Yes   Spinal Cord Stimulator:[x]  No []  Yes       History of Sleep Apnea? [x]  No []  Yes   CPAP used?- [x]  No []  Yes    Does the patient monitor blood sugar?   []  N/A   []  No []  Yes  Patient has: []  NO Hx DM   [x]  Pre-DM   []  DM1  []   DM2 Last A1c was: 6.5 on   10-12-2022   Does patient have a [employer] or Dexacom? []  No []  Yes   Fasting Blood Sugar Ranges-  Checks Blood Sugar _____ times a day  Blood Thinner / Instructions:Eliquis-  not taking now ??? Aspirin Instructions:   nonr  ERAS Protocol Ordered: []  No  [x]  Yes PRE-SURGERY []  ENSURE  [x]  G2   Patient is to be NPO after: 11:45  Dental hx: []  Dentures:  []  N/A      []  Bridge or Partial:                   []  Loose or Damaged teeth:   Comments: Patient was given the 5 CHG shower / bath instructions for TKA  surgery along with 2 bottles of the CHG soap. Patient will start this on: 01-31-23 All questions were asked and answered, Patient voiced understanding of this process.   Activity level: Patient is able / unable to climb a flight of stairs without difficulty; []  No CP  []  No SOB, but would have ___   Patient can / can not perform ADLs without assistance.   Anesthesia review: Pre-DM- no meds, HTN, Hx DVT, CKD3, chronic anemia, Hx Hep B, ? LFTs  Patient denies shortness of breath, fever, cough and chest pain at PAT appointment.  Patient verbalized understanding and agreement to the Pre-Surgical  Instructions that were given to them at this PAT appointment. Patient was also educated of the need to review these PAT instructions again prior to her surgery.I reviewed the appropriate phone numbers to call if they have any and questions or concerns.

## 2023-01-28 ENCOUNTER — Encounter (HOSPITAL_COMMUNITY)
Admission: RE | Admit: 2023-01-28 | Discharge: 2023-01-28 | Disposition: A | Discharge status: Home / Self Care | Payer: Medicaid Other | Source: Ambulatory Visit | Attending: Anesthesiology | Admitting: Anesthesiology

## 2023-01-28 DIAGNOSIS — R7303 Prediabetes: Secondary | ICD-10-CM

## 2023-01-28 DIAGNOSIS — Z01818 Encounter for other preprocedural examination: Secondary | ICD-10-CM

## 2023-01-28 DIAGNOSIS — R7989 Other specified abnormal findings of blood chemistry: Secondary | ICD-10-CM

## 2023-01-31 ENCOUNTER — Encounter (HOSPITAL_COMMUNITY): Payer: Self-pay

## 2023-01-31 ENCOUNTER — Encounter (HOSPITAL_COMMUNITY)
Admission: RE | Admit: 2023-01-31 | Discharge: 2023-01-31 | Disposition: A | Discharge status: Home / Self Care | Payer: Medicaid Other | Source: Ambulatory Visit | Attending: Orthopedic Surgery | Admitting: Orthopedic Surgery

## 2023-01-31 ENCOUNTER — Other Ambulatory Visit: Payer: Self-pay

## 2023-01-31 DIAGNOSIS — R7303 Prediabetes: Secondary | ICD-10-CM | POA: Diagnosis not present

## 2023-01-31 DIAGNOSIS — R7989 Other specified abnormal findings of blood chemistry: Secondary | ICD-10-CM | POA: Insufficient documentation

## 2023-01-31 DIAGNOSIS — Z01818 Encounter for other preprocedural examination: Secondary | ICD-10-CM | POA: Insufficient documentation

## 2023-01-31 HISTORY — DX: Prediabetes: R73.03

## 2023-01-31 HISTORY — DX: Pneumonia, unspecified organism: J18.9

## 2023-01-31 HISTORY — DX: Unspecified osteoarthritis, unspecified site: M19.90

## 2023-01-31 LAB — COMPREHENSIVE METABOLIC PANEL
ALT: 22 U/L (ref 0–44)
AST: 32 U/L (ref 15–41)
Albumin: 3.9 g/dL (ref 3.5–5.0)
Alkaline Phosphatase: 55 U/L (ref 38–126)
Anion gap: 10 (ref 5–15)
BUN: 26 mg/dL — ABNORMAL HIGH (ref 8–23)
CO2: 22 mmol/L (ref 22–32)
Calcium: 9.3 mg/dL (ref 8.9–10.3)
Chloride: 105 mmol/L (ref 98–111)
Creatinine, Ser: 1.29 mg/dL — ABNORMAL HIGH (ref 0.44–1.00)
GFR, Estimated: 42 mL/min — ABNORMAL LOW (ref >60)
Glucose, Bld: 97 mg/dL (ref 70–99)
Potassium: 5.3 mmol/L — ABNORMAL HIGH (ref 3.5–5.1)
Sodium: 137 mmol/L (ref 135–145)
Total Bilirubin: 0.8 mg/dL (ref 0.0–1.2)
Total Protein: 7.1 g/dL (ref 6.5–8.1)

## 2023-01-31 LAB — CBC
HCT: 34.8 % — ABNORMAL LOW (ref 36.0–46.0)
Hemoglobin: 10.4 g/dL — ABNORMAL LOW (ref 12.0–15.0)
MCH: 21.6 pg — ABNORMAL LOW (ref 26.0–34.0)
MCHC: 29.9 g/dL — ABNORMAL LOW (ref 30.0–36.0)
MCV: 72.3 fL — ABNORMAL LOW (ref 80.0–100.0)
Platelets: 175 10*3/uL (ref 150–400)
RBC: 4.81 MIL/uL (ref 3.87–5.11)
RDW: 17.2 % — ABNORMAL HIGH (ref 11.5–15.5)
WBC: 6.4 10*3/uL (ref 4.0–10.5)
nRBC: 0 % (ref 0.0–0.2)

## 2023-01-31 LAB — SURGICAL PCR SCREEN
MRSA, PCR: NEGATIVE
Staphylococcus aureus: NEGATIVE

## 2023-01-31 NOTE — Patient Instructions (Signed)
SURGICAL WAITING ROOM VISITATION Patients having surgery or a procedure may have no more than 2 support people in the waiting area - these visitors may rotate in the visitor waiting room.   Due to an increase in RSV and influenza rates and associated hospitalizations, children ages 18 and under may not visit patients in [facility] hospitals. If the patient needs to stay at the hospital during part of their recovery, the visitor guidelines for inpatient rooms apply.   PRE-OP VISITATION  Pre-op nurse will coordinate an appropriate time for 1 support person to accompany the patient in pre-op.  This support person may not rotate.  This visitor will be contacted when the time is appropriate for the visitor to come back in the pre-op area.   Please refer to the [facility] website for the visitor guidelines for Inpatients (after your surgery is over and you are in a regular room).   You are not required to quarantine at this time prior to your surgery. However, you must do this: Hand Hygiene often Do NOT share personal items Notify your provider if you are in close contact with someone who has COVID or you develop fever 100.4 or greater, new onset of sneezing, cough, sore throat, shortness of breath or body aches.  If you test positive for Covid or have been in contact with anyone that has tested positive in the last 10 days please notify you surgeon.     Your procedure is scheduled on:  MONDAY   February 04, 2023   Report to [facility] Main Entrance: Leota Jacobsen entrance where the [employer] is available.    Report to admitting at: 12:15  PM   Call this number if you have any questions or problems the morning of surgery [phone]   Do not eat food after Midnight the night prior to your surgery/procedure.   After Midnight you may have the following liquids until  11:45 AM  DAY OF SURGERY   Clear Liquid Diet Water Black Coffee (sugar ok, NO MILK/CREAM OR CREAMERS)  Tea (sugar  ok, NO MILK/CREAM OR CREAMERS) regular and decaf                             Plain Jell-O  with no fruit (NO RED)                                           Fruit ices (not with fruit pulp, NO RED)                                     Popsicles (NO RED)                                                                  Juice: NO CITRUS JUICES: only apple, WHITE grape, WHITE cranberry Sports drinks like Gatorade or Powerade (NO RED)  The day of surgery:  Drink ONE (1) Pre-Surgery G2 at    11:45 AM the morning of surgery. Drink in one sitting. Do not sip.  This drink was given to you during your hospital pre-op appointment visit. Nothing else to drink after completing the Pre-Surgery  G2 : No candy, chewing gum or throat lozenges.     FOLLOW ANY ADDITIONAL PRE OP INSTRUCTIONS YOU RECEIVED FROM YOUR SURGEON'S OFFICE!!!    Oral Hygiene is also important to reduce your risk of infection.        Remember - BRUSH YOUR TEETH THE MORNING OF SURGERY WITH YOUR REGULAR TOOTHPASTE   Do NOT smoke after Midnight the night before surgery.   STOP TAKING all Vitamins, Herbs and supplements 1 week before your surgery.    Take ONLY these medicines the morning of surgery with A SIP OF WATER:  You may take Tylenol if needed for pain.  You may use your Eye drops if needed.                    You may not have any metal on your body including hair pins, jewelry, and body piercing   Do not wear make-up, lotions, powders, perfumes or deodorant   Do not wear nail polish including gel and S&S, artificial / acrylic nails, or any other type of covering on natural nails including finger and toenails. If you have artificial nails, gel coating, etc., that needs to be removed by a nail salon, Please have this removed prior to surgery. Not doing so may mean that your surgery could be cancelled or delayed if the Surgeon or anesthesia staff feels like they are unable to monitor you safely.    Do  not shave 48 hours prior to surgery to avoid nicks in your skin which may contribute to postoperative infections.    Contacts, Hearing Aids, dentures or bridgework may not be worn into surgery. DENTURES WILL BE REMOVED PRIOR TO SURGERY PLEASE DO NOT APPLY "Poly grip" OR ADHESIVES!!!   You may bring a small overnight bag with you on the day of surgery, only pack items that are not valuable. [facility] IS NOT RESPONSIBLE   FOR VALUABLES THAT ARE LOST OR STOLEN.    Do not bring your home medications to the hospital. The Pharmacy will dispense medications listed on your medication list to you during your admission in the Hospital.   Please read over the following fact sheets you were given: IF YOU HAVE QUESTIONS ABOUT YOUR PRE-OP INSTRUCTIONS, PLEASE CALL [phone].        Pre-operative 5 CHG Bath Instructions    You can play a key role in reducing the risk of infection after surgery. Your skin needs to be as free of germs as possible. You can reduce the number of germs on your skin by washing with CHG (chlorhexidine gluconate) soap before surgery. CHG is an antiseptic soap that kills germs and continues to kill germs even after washing.    DO NOT use if you have an allergy to chlorhexidine/CHG or antibacterial soaps. If your skin becomes reddened or irritated, stop using the CHG and notify one of our RNs at [phone]   Please shower with the CHG soap starting 4 days before surgery using the following schedule: START SHOWERS ON   THURSDAY  January 31, 2023  Please keep in mind the following:  DO NOT shave, including legs and underarms, starting the day of your first shower.   You may shave your face at any point before/day of surgery.    Place clean sheets on your bed the day you start using CHG soap. Use a clean  washcloth (not used since being washed) for each shower. DO NOT sleep with pets once you start using the CHG.    CHG Shower Instructions:  If you choose to wash your hair and private area, wash first with your normal shampoo/soap.  After you use shampoo/soap, rinse your hair and body thoroughly to remove shampoo/soap residue.  Turn the water OFF and apply about 3 tablespoons (45 ml) of CHG soap to a CLEAN washcloth.  Apply CHG soap ONLY FROM YOUR NECK DOWN TO YOUR TOES (washing for 3-5 minutes)  DO NOT use CHG soap on face, private areas, open wounds, or sores.  Pay special attention to the area where your surgery is being performed.  If you are having back surgery, having someone wash your back for you may be helpful.   Wait 2 minutes after CHG soap is applied, then you may rinse off the CHG soap.  Pat dry with a clean towel  Put on clean clothes/pajamas   If you choose to wear lotion, please use ONLY the CHG-compatible lotions on the back of this paper.     Additional instructions for the day of surgery: DO NOT APPLY any lotions, deodorants, cologne, or perfumes.   Put on clean/comfortable clothes.  Brush your teeth.  Ask your nurse before applying any prescription medications to the skin.          CHG Compatible Lotions    Aveeno Moisturizing lotion  Cetaphil Moisturizing Cream  Cetaphil Moisturizing Lotion  Clairol Herbal Essence Moisturizing Lotion, Dry Skin  Clairol Herbal Essence Moisturizing Lotion, Extra Dry Skin  Clairol Herbal Essence Moisturizing Lotion, Normal Skin  Curel Age Defying Therapeutic Moisturizing Lotion with Alpha Hydroxy  Curel Extreme Care Body Lotion  Curel Soothing Hands Moisturizing Hand Lotion  Curel Therapeutic Moisturizing Cream, Fragrance-Free  Curel Therapeutic Moisturizing Lotion, Fragrance-Free  Curel Therapeutic Moisturizing Lotion, Original Formula  Eucerin Daily Replenishing Lotion  Eucerin Dry Skin Therapy Plus Alpha Hydroxy Crme   Eucerin Dry Skin Therapy Plus Alpha Hydroxy Lotion  Eucerin Original Crme  Eucerin Original Lotion  Eucerin Plus Crme Eucerin Plus Lotion  Eucerin TriLipid Replenishing Lotion  Keri Anti-Bacterial Hand Lotion  Keri Deep Conditioning Original Lotion Dry Skin Formula Softly Scented  Keri Deep Conditioning Original Lotion, Fragrance Free Sensitive Skin Formula  Keri Lotion Fast Absorbing Fragrance Free Sensitive Skin Formula  Keri Lotion Fast Absorbing Softly Scented Dry Skin Formula  Keri Original Lotion  Keri Skin Renewal Lotion Keri Silky Smooth Lotion  Keri Silky Smooth Sensitive Skin Lotion  Nivea Body Creamy Conditioning Oil  Nivea Body Extra Enriched Lotion  Nivea Body Original Lotion  Nivea Body Sheer Moisturizing Lotion Nivea Crme  Nivea Skin Firming Lotion  NutraDerm 30 Skin Lotion  NutraDerm Skin Lotion  NutraDerm Therapeutic Skin Cream  NutraDerm Therapeutic Skin Lotion  ProShield Protective Hand Cream  Provon moisturizing lotion     FAILURE TO FOLLOW THESE INSTRUCTIONS MAY RESULT IN THE CANCELLATION OF YOUR SURGERY   PATIENT SIGNATURE_________________________________   NURSE SIGNATURE__________________________________   ________________________________________________________________________          Tina Bryant      An incentive spirometer is a tool that can help keep your  lungs clear and active. This tool measures how well you are filling your lungs with each breath. Taking long deep breaths may help reverse or decrease the chance of developing breathing (pulmonary) problems (especially infection) following: A long period of time when you are unable to move or be active. BEFORE THE PROCEDURE  If the spirometer includes an indicator to show your best effort, your nurse or respiratory therapist will set it to a desired goal. If possible, sit up straight or lean slightly forward. Try not to slouch. Hold the incentive spirometer in an upright  position. INSTRUCTIONS FOR USE  Sit on the edge of your bed if possible, or sit up as far as you can in bed or on a chair. Hold the incentive spirometer in an upright position. Breathe out normally. Place the mouthpiece in your mouth and seal your lips tightly around it. Breathe in slowly and as deeply as possible, raising the piston or the ball toward the top of the column. Hold your breath for 3-5 seconds or for as long as possible. Allow the piston or ball to fall to the bottom of the column. Remove the mouthpiece from your mouth and breathe out normally. Rest for a few seconds and repeat Steps 1 through 7 at least 10 times every 1-2 hours when you are awake. Take your time and take a few normal breaths between deep breaths. The spirometer may include an indicator to show your best effort. Use the indicator as a goal to work toward during each repetition. After each set of 10 deep breaths, practice coughing to be sure your lungs are clear. If you have an incision (the cut made at the time of surgery), support your incision when coughing by placing a pillow or rolled up towels firmly against it. Once you are able to get out of bed, walk around indoors and cough well. You may stop using the incentive spirometer when instructed by your caregiver.  RISKS AND COMPLICATIONS Take your time so you do not get dizzy or light-headed. If you are in pain, you may need to take or ask for pain medication before doing incentive spirometry. It is harder to take a deep breath if you are having pain. AFTER USE Rest and breathe slowly and easily. It can be helpful to keep track of a log of your progress. Your caregiver can provide you with a simple table to help with this. If you are using the spirometer at home, follow these instructions: SEEK MEDICAL CARE IF:  You are having difficultly using the spirometer. You have trouble using the spirometer as often as instructed. Your pain medication is not giving  enough relief while using the spirometer. You develop fever of 100.5 F (38.1 C) or higher.                                                                                                    SEEK IMMEDIATE MEDICAL CARE IF:  You cough up bloody sputum that had not been present before. You develop fever of 102 F (38.9 C) or greater. You develop worsening  pain at or near the incision site. MAKE SURE YOU:  Understand these instructions. Will watch your condition. Will get help right away if you are not doing well or get worse. Document Released: 05/07/2006 Document Revised: 03/19/2011 Document Reviewed: 07/08/2006 [facility] Patient Information 2014 [place], [place].                If you would like to see a video about joint replacement:    [email or web address]           Electronically signed by Sharee Pimple, RN at 01/26/2023  6:56 PM

## 2023-01-31 NOTE — Progress Notes (Signed)
COVID Vaccine received:  []  No [x]  Yes Date of any COVID positive Test in last 90 days:  None   PCP - Newton Pigg, FNP at [facility].  [phone] Cardiologist - none   Chest x-ray - 10-12-2022 2v  Epic EKG -  10-12-2022  Epic Stress Test - 09-12-2019 ECHO - 11-02-2022  Epic Cardiac Cath -    PCR screen: [x]  Ordered & Completed []   No Order but Needs PROFEND     []   N/A for this surgery   Surgery Plan:  []  Ambulatory   [x]  Outpatient in bed  []  Admit Anesthesia:    []  General  [x]  Spinal  []   Choice []   MAC   Pacemaker / ICD device [x]  No []  Yes   Spinal Cord Stimulator:[x]  No []  Yes       History of Sleep Apnea? [x]  No []  Yes   CPAP used?- [x]  No []  Yes     Does the patient monitor blood sugar?   []  N/A   [x]  No []  Yes  Patient has: []  NO Hx DM   [x]  Pre-DM   []  DM1  []   DM2 Last A1c was: 6.5 on   10-12-2022     Blood Thinner / Instructions:Eliquis-  not taking now ??? Aspirin Instructions:   none   ERAS Protocol Ordered: []  No  [x]  Yes PRE-SURGERY []  ENSURE  [x]  G2   Patient is to be NPO after: 11:45   Dental hx: [x]  Dentures:  []  N/A      []  Bridge or Partial:                   []  Loose or Damaged teeth:    Comments: Patient was given the 5 CHG shower / bath instructions for TKA  surgery along with 2 bottles of the CHG soap. Patient will start this on: 01-31-23 All questions were asked and answered, Patient voiced understanding of this process.    Activity level: Patient is able to climb a flight of stairs without difficulty; [x]  No CP  [x]  No SOB, but would have _leg pain. Patient can perform ADLs without assistance.    Anesthesia review: Pre-DM- no meds, HTN, Hx DVT, CKD3, chronic anemia, Hx Hep B, ? LFTs   Patient denies shortness of breath, fever, cough and chest pain at PAT appointment.   Patient verbalized understanding and agreement to the Pre-Surgical Instructions that were given to them at this PAT appointment. Patient was also educated of the need  to review these PAT instructions again prior to her surgery.I reviewed the appropriate phone numbers to call if they have any and questions or concerns.

## 2023-02-03 DIAGNOSIS — M1712 Unilateral primary osteoarthritis, left knee: Principal | ICD-10-CM | POA: Diagnosis present

## 2023-02-03 NOTE — H&P (Signed)
TOTAL KNEE ADMISSION H&P  Patient is being admitted for left total knee arthroplasty.  Subjective:  Chief Complaint:left knee pain.  HPI: Tina Bryant, 78 y.o. female, has a history of pain and functional disability in the left knee due to arthritis and has failed non-surgical conservative treatments for greater than 12 weeks to includeNSAID's and/or analgesics, corticosteriod injections, flexibility and strengthening excercises, supervised PT with diminished ADL's post treatment, weight reduction as appropriate, and activity modification.  Onset of symptoms was gradual, starting 5 years ago with gradually worsening course since that time. The patient noted no past surgery on the left knee(s).  Patient currently rates pain in the left knee(s) at 8 out of 10 with activity. Patient has night pain, worsening of pain with activity and weight bearing, pain that interferes with activities of daily living, pain with passive range of motion, and crepitus.  Patient has evidence of subchondral sclerosis, periarticular osteophytes, and joint space narrowing by imaging studies. . There is no active infection.  Patient Active Problem List   Diagnosis Date Noted   Primary osteoarthritis of left knee 02/03/2023   DVT (deep venous thrombosis) (HCC) 11/01/2022   Fall at home, initial encounter 11/01/2022   CAP (community acquired pneumonia) 11/01/2022   Severe sepsis (HCC) 11/01/2022   AKI (acute kidney injury) (HCC) 11/01/2022   Acute metabolic encephalopathy 11/01/2022   CKD (chronic kidney disease) stage 3, GFR 30-59 ml/min (HCC) 11/01/2022   Elevated troponin 11/01/2022   Non-insulin treated type 2 diabetes mellitus (HCC) 11/01/2022   High anion gap metabolic acidosis 11/01/2022   Acute deep vein thrombosis (DVT) of femoral vein of left lower extremity (HCC) 06/08/2022   Hyperlipidemia 07/09/2006   Microcytic anemia 07/09/2006   DEPRESSION 07/09/2006   Essential hypertension 07/09/2006   Past  Medical History:  Diagnosis Date   Arthritis    Chronic kidney disease    CKD3   DVT (deep venous thrombosis) (HCC)    Hepatitis    b   Hypertension    Pneumonia    Pre-diabetes     Past Surgical History:  Procedure Laterality Date   EYE SURGERY Left    strabismus surgery    No current facility-administered medications for this encounter.   Current Outpatient Medications  Medication Sig Dispense Refill Last Dose/Taking   acetaminophen (TYLENOL) 500 MG tablet Take 500 mg by mouth every 6 (six) hours as needed for mild pain (pain score 1-3) or moderate pain (pain score 4-6) (Take for back pain).   Taking As Needed   atorvastatin (LIPITOR) 40 MG tablet Take 40 mg by mouth daily.   Taking   carboxymethylcellulose (REFRESH PLUS) 0.5 % SOLN Place 1-2 drops into both eyes See admin instructions. Instill 2 drops in left eye daily and 1 drop into the right eye daily   Taking   Cholecalciferol (VITAMIN D-1000 MAX ST) 25 MCG (1000 UT) tablet Take 1,000 Units by mouth daily.   Taking   Flaxseed, Linseed, (FLAXSEED OIL PO) Take 1 capsule by mouth daily.   Taking   losartan (COZAAR) 25 MG tablet Take 25 mg by mouth daily with breakfast.   Taking   Multiple Vitamins-Minerals (CENTRUM SILVER PO) Take 1 tablet by mouth daily.   Taking   spironolactone-hydrochlorothiazide (ALDACTAZIDE) 25-25 MG tablet Take 1 tablet by mouth daily.   Taking   ELIQUIS 5 MG TABS tablet Take 1 tablet (5 mg total) by mouth 2 (two) times daily. Start taking after completion of starter pack. (Patient not taking: Reported  on 01/24/2023) 60 tablet 5 Not Taking   Allergies  Allergen Reactions   Tramadol     States it caused bruises on her body; she also stated it causes issues with her kidneys   Lisinopril Cough    Social History   Tobacco Use   Smoking status: Never   Smokeless tobacco: Not on file  Substance Use Topics   Alcohol use: No    No family history on file.   Review of Systems  Negative as it relates to  the HPI.  Positive for joint complaints  Objective:  There were no vitals taken for this visit.  General Appearance:  Alert, cooperative, no distress, appears stated age  Head:  Normocephalic, without obvious abnormality, atraumatic  Eyes:  PERRL, conjunctiva/corneas clear, EOM's intact, fundi benign, both eyes  Ears:  Normal TM's and external ear canals, both ears  Nose: Nares normal, septum midline,mucosa normal, no drainage or sinus tenderness  Throat: Lips, mucosa, and tongue normal; teeth and gums normal  Neck: Supple, symmetrical, trachea midline, no adenopathy;  thyroid: not enlarged, symmetric, no tenderness/mass/nodules; no carotid bruit or JVD  Back:   Symmetric, no curvature, ROM normal, no CVA tenderness  Lungs:   Clear to auscultation bilaterally, respirations unlabored     Heart:  Regular rate and rhythm, S1 and S2 normal, no murmur, rub, or gallop  Abdomen:   Soft, non-tender, bowel sounds active all four quadrants,  no masses, no organomegaly  Pelvic: Deferred  Extremities: Extremities normal, atraumatic, no cyanosis or edema  Pulses: 2+ and symmetric  Skin: Skin color, texture, turgor normal, no rashes or lesions  Lymph nodes: Cervical, supraclavicular, and axillary nodes normal  Neurologic: Normal  Left knee exam: She has crepitation through range of motion.  Mild effusion.  Pain through motion.  No ligamentous instability.  Tenderness throughout.   Vital signs in last 24 hours:    Labs:   Estimated body mass index is 30.34 kg/m as calculated from the following:   Height as of 01/31/23: 5\' 6"  (1.676 m).   Weight as of 01/31/23: 85.3 kg.   Imaging Review Plain radiographs demonstrate severe degenerative joint disease of the left knee(s). The overall alignment isneutral. The bone quality appears to be good for age and reported activity level.      Assessment/Plan:  End stage arthritis, left knee   The patient history, physical examination, clinical  judgment of the provider and imaging studies are consistent with end stage degenerative joint disease of the left knee(s) and total knee arthroplasty is deemed medically necessary. The treatment options including medical management, injection therapy arthroscopy and arthroplasty were discussed at length. The risks and benefits of total knee arthroplasty were presented and reviewed. The risks due to aseptic loosening, infection, stiffness, patella tracking problems, thromboembolic complications and other imponderables were discussed. The patient acknowledged the explanation, agreed to proceed with the plan and consent was signed. Patient is being admitted for inpatient treatment for surgery, pain control, PT, OT, prophylactic antibiotics, VTE prophylaxis, progressive ambulation and ADL's and discharge planning. The patient is planning to be discharged home with home health services

## 2023-02-04 ENCOUNTER — Observation Stay (HOSPITAL_COMMUNITY)
Admission: RE | Admit: 2023-02-04 | Discharge: 2023-02-09 | Disposition: A | Discharge status: Home / Self Care | Payer: Medicaid Other | Source: Ambulatory Visit | Attending: Orthopedic Surgery | Admitting: Orthopedic Surgery

## 2023-02-04 ENCOUNTER — Ambulatory Visit (HOSPITAL_COMMUNITY): Payer: Medicaid Other | Admitting: Anesthesiology

## 2023-02-04 ENCOUNTER — Encounter (HOSPITAL_COMMUNITY): Payer: Self-pay | Admitting: Orthopedic Surgery

## 2023-02-04 ENCOUNTER — Other Ambulatory Visit: Payer: Self-pay

## 2023-02-04 ENCOUNTER — Encounter (HOSPITAL_COMMUNITY)
Admission: RE | Disposition: A | Discharge status: Home / Self Care | Payer: Self-pay | Source: Ambulatory Visit | Attending: Orthopedic Surgery

## 2023-02-04 DIAGNOSIS — M1712 Unilateral primary osteoarthritis, left knee: Secondary | ICD-10-CM | POA: Diagnosis present

## 2023-02-04 DIAGNOSIS — N183 Chronic kidney disease, stage 3 unspecified: Secondary | ICD-10-CM | POA: Diagnosis not present

## 2023-02-04 DIAGNOSIS — I129 Hypertensive chronic kidney disease with stage 1 through stage 4 chronic kidney disease, or unspecified chronic kidney disease: Secondary | ICD-10-CM | POA: Diagnosis not present

## 2023-02-04 DIAGNOSIS — Z7901 Long term (current) use of anticoagulants: Secondary | ICD-10-CM | POA: Insufficient documentation

## 2023-02-04 DIAGNOSIS — Z86718 Personal history of other venous thrombosis and embolism: Secondary | ICD-10-CM | POA: Diagnosis not present

## 2023-02-04 DIAGNOSIS — Z79899 Other long term (current) drug therapy: Secondary | ICD-10-CM | POA: Insufficient documentation

## 2023-02-04 HISTORY — PX: TOTAL KNEE ARTHROPLASTY: SHX125

## 2023-02-04 LAB — GLUCOSE, CAPILLARY: Glucose-Capillary: 87 mg/dL (ref 70–99)

## 2023-02-04 SURGERY — ARTHROPLASTY, KNEE, TOTAL
Anesthesia: Spinal | Site: Knee | Laterality: Left

## 2023-02-04 MED ORDER — TRANEXAMIC ACID-NACL 1000-0.7 MG/100ML-% IV SOLN
1000.0000 mg | Freq: Once | INTRAVENOUS | Status: AC
  Administered 2023-02-04: 1000 mg via INTRAVENOUS
  Filled 2023-02-04: qty 100

## 2023-02-04 MED ORDER — POLYVINYL ALCOHOL 1.4 % OP SOLN
1.0000 [drp] | Freq: Every day | OPHTHALMIC | Status: DC
  Administered 2023-02-06 – 2023-02-09 (×4): 1 [drp] via OPHTHALMIC
  Filled 2023-02-04: qty 15

## 2023-02-04 MED ORDER — METHOCARBAMOL 1000 MG/10ML IJ SOLN: 500.0000 mg | Freq: Four times a day (QID) | INTRAMUSCULAR | Status: DC | PRN

## 2023-02-04 MED ORDER — HYDROCODONE-ACETAMINOPHEN 5-325 MG PO TABS
1.0000 | ORAL_TABLET | ORAL | Status: DC | PRN
  Administered 2023-02-07 – 2023-02-09 (×3): 1 via ORAL
  Filled 2023-02-04 (×4): qty 1

## 2023-02-04 MED ORDER — GLYCOPYRROLATE 0.2 MG/ML IJ SOLN
INTRAMUSCULAR | Status: DC | PRN
  Administered 2023-02-04 (×2): .1 mg via INTRAVENOUS

## 2023-02-04 MED ORDER — SODIUM CHLORIDE (PF) 0.9 % IJ SOLN
INTRAMUSCULAR | Status: DC | PRN
  Administered 2023-02-04: 100 mL

## 2023-02-04 MED ORDER — OXYCODONE HCL 5 MG PO TABS
5.0000 mg | ORAL_TABLET | Freq: Once | ORAL | Status: DC | PRN
Start: 2023-02-04 — End: 2023-02-04

## 2023-02-04 MED ORDER — DEXMEDETOMIDINE HCL IN NACL 80 MCG/20ML IV SOLN
INTRAVENOUS | Status: AC
  Filled 2023-02-04: qty 20

## 2023-02-04 MED ORDER — ACETAMINOPHEN 500 MG PO TABS
500.0000 mg | ORAL_TABLET | Freq: Four times a day (QID) | ORAL | Status: AC
  Administered 2023-02-04 – 2023-02-05 (×4): 500 mg via ORAL
  Filled 2023-02-04 (×4): qty 1

## 2023-02-04 MED ORDER — BUPIVACAINE IN DEXTROSE 0.75-8.25 % IT SOLN
INTRATHECAL | Status: DC | PRN
  Administered 2023-02-04: 1.8 mL via INTRATHECAL

## 2023-02-04 MED ORDER — HYDROCHLOROTHIAZIDE 25 MG PO TABS
25.0000 mg | ORAL_TABLET | Freq: Every day | ORAL | Status: DC
  Administered 2023-02-05 – 2023-02-09 (×5): 25 mg via ORAL
  Filled 2023-02-04 (×5): qty 1

## 2023-02-04 MED ORDER — FENTANYL CITRATE (PF) 100 MCG/2ML IJ SOLN
INTRAMUSCULAR | Status: DC | PRN
  Administered 2023-02-04 (×8): 12.5 ug via INTRAVENOUS

## 2023-02-04 MED ORDER — PROPOFOL 1000 MG/100ML IV EMUL
INTRAVENOUS | Status: AC
  Filled 2023-02-04: qty 100

## 2023-02-04 MED ORDER — ACETAMINOPHEN 325 MG PO TABS
325.0000 mg | ORAL_TABLET | Freq: Four times a day (QID) | ORAL | Status: DC | PRN
  Administered 2023-02-06 – 2023-02-07 (×3): 650 mg via ORAL
  Filled 2023-02-04 (×3): qty 2

## 2023-02-04 MED ORDER — DIPHENHYDRAMINE HCL 12.5 MG/5ML PO ELIX: 12.5000 mg | ORAL_SOLUTION | ORAL | Status: DC | PRN

## 2023-02-04 MED ORDER — SODIUM CHLORIDE 0.9 % IV SOLN: INTRAVENOUS | Status: DC

## 2023-02-04 MED ORDER — WATER FOR IRRIGATION, STERILE IR SOLN
Status: DC | PRN
  Administered 2023-02-04: 1000 mL

## 2023-02-04 MED ORDER — METOPROLOL TARTRATE 5 MG/5ML IV SOLN
INTRAVENOUS | Status: AC
  Filled 2023-02-04: qty 5

## 2023-02-04 MED ORDER — LIDOCAINE HCL (CARDIAC) PF 100 MG/5ML IV SOSY
PREFILLED_SYRINGE | INTRAVENOUS | Status: DC | PRN
  Administered 2023-02-04: 60 mg via INTRAVENOUS

## 2023-02-04 MED ORDER — POVIDONE-IODINE 10 % EX SWAB: 2.0000 | Freq: Once | CUTANEOUS | Status: DC

## 2023-02-04 MED ORDER — DEXAMETHASONE SODIUM PHOSPHATE 10 MG/ML IJ SOLN
8.0000 mg | Freq: Two times a day (BID) | INTRAMUSCULAR | Status: AC
  Administered 2023-02-04 – 2023-02-05 (×2): 8 mg via INTRAVENOUS
  Filled 2023-02-04 (×2): qty 1

## 2023-02-04 MED ORDER — 0.9 % SODIUM CHLORIDE (POUR BTL) OPTIME
TOPICAL | Status: DC | PRN
  Administered 2023-02-04: 1000 mL

## 2023-02-04 MED ORDER — CHLORHEXIDINE GLUCONATE 0.12 % MT SOLN: 15.0000 mL | Freq: Once | OROMUCOSAL | Status: DC

## 2023-02-04 MED ORDER — CEFAZOLIN SODIUM-DEXTROSE 2-4 GM/100ML-% IV SOLN
2.0000 g | Freq: Four times a day (QID) | INTRAVENOUS | Status: AC
  Administered 2023-02-05 (×2): 2 g via INTRAVENOUS
  Filled 2023-02-04 (×2): qty 100

## 2023-02-04 MED ORDER — METOPROLOL TARTRATE 5 MG/5ML IV SOLN
INTRAVENOUS | Status: DC | PRN
  Administered 2023-02-04 (×2): 1 mg via INTRAVENOUS

## 2023-02-04 MED ORDER — MORPHINE SULFATE (PF) 2 MG/ML IV SOLN: 0.5000 mg | INTRAVENOUS | Status: DC | PRN

## 2023-02-04 MED ORDER — BUPIVACAINE LIPOSOME 1.3 % IJ SUSP: 20.0000 mL | Freq: Once | INTRAMUSCULAR | Status: DC

## 2023-02-04 MED ORDER — ONDANSETRON HCL 4 MG PO TABS: 4.0000 mg | ORAL_TABLET | Freq: Four times a day (QID) | ORAL | Status: DC | PRN

## 2023-02-04 MED ORDER — FENTANYL CITRATE PF 50 MCG/ML IJ SOSY
50.0000 ug | PREFILLED_SYRINGE | INTRAMUSCULAR | Status: DC
  Administered 2023-02-04: 50 ug via INTRAVENOUS
  Filled 2023-02-04: qty 2

## 2023-02-04 MED ORDER — LACTATED RINGERS IV SOLN: INTRAVENOUS | Status: DC | PRN

## 2023-02-04 MED ORDER — METHOCARBAMOL 500 MG PO TABS
500.0000 mg | ORAL_TABLET | Freq: Four times a day (QID) | ORAL | Status: DC | PRN
  Administered 2023-02-07: 500 mg via ORAL
  Filled 2023-02-04 (×2): qty 1

## 2023-02-04 MED ORDER — DOCUSATE SODIUM 100 MG PO CAPS
100.0000 mg | ORAL_CAPSULE | Freq: Two times a day (BID) | ORAL | Status: DC
  Administered 2023-02-04 – 2023-02-09 (×8): 100 mg via ORAL
  Filled 2023-02-04 (×10): qty 1

## 2023-02-04 MED ORDER — SODIUM CHLORIDE (PF) 0.9 % IJ SOLN
INTRAMUSCULAR | Status: AC
  Filled 2023-02-04: qty 50

## 2023-02-04 MED ORDER — FENTANYL CITRATE (PF) 100 MCG/2ML IJ SOLN
INTRAMUSCULAR | Status: AC
  Filled 2023-02-04: qty 2

## 2023-02-04 MED ORDER — SPIRONOLACTONE 25 MG PO TABS
25.0000 mg | ORAL_TABLET | Freq: Every day | ORAL | Status: DC
  Administered 2023-02-05 – 2023-02-09 (×5): 25 mg via ORAL
  Filled 2023-02-04 (×5): qty 1

## 2023-02-04 MED ORDER — GLYCOPYRROLATE 0.2 MG/ML IJ SOLN
INTRAMUSCULAR | Status: AC
  Filled 2023-02-04: qty 1

## 2023-02-04 MED ORDER — HYDROMORPHONE HCL 1 MG/ML IJ SOLN
INTRAMUSCULAR | Status: DC | PRN
  Administered 2023-02-04: .2 mg via INTRAVENOUS
  Administered 2023-02-04: .4 mg via INTRAVENOUS
  Administered 2023-02-04: .2 mg via INTRAVENOUS
  Administered 2023-02-04 (×3): .4 mg via INTRAVENOUS

## 2023-02-04 MED ORDER — TRANEXAMIC ACID-NACL 1000-0.7 MG/100ML-% IV SOLN
1000.0000 mg | INTRAVENOUS | Status: DC
  Filled 2023-02-04: qty 100

## 2023-02-04 MED ORDER — POLYETHYLENE GLYCOL 3350 17 G PO PACK: 17.0000 g | PACK | Freq: Every day | ORAL | Status: DC | PRN

## 2023-02-04 MED ORDER — BUPIVACAINE LIPOSOME 1.3 % IJ SUSP
INTRAMUSCULAR | Status: AC
  Filled 2023-02-04: qty 20

## 2023-02-04 MED ORDER — HYDROMORPHONE HCL 2 MG/ML IJ SOLN
INTRAMUSCULAR | Status: AC
  Filled 2023-02-04: qty 1

## 2023-02-04 MED ORDER — MAGNESIUM CITRATE PO SOLN: 1.0000 | Freq: Once | ORAL | Status: DC | PRN

## 2023-02-04 MED ORDER — ACETAMINOPHEN 10 MG/ML IV SOLN: 1000.0000 mg | Freq: Once | INTRAVENOUS | Status: DC | PRN

## 2023-02-04 MED ORDER — FENTANYL CITRATE PF 50 MCG/ML IJ SOSY: 25.0000 ug | PREFILLED_SYRINGE | INTRAMUSCULAR | Status: DC | PRN

## 2023-02-04 MED ORDER — BISACODYL 5 MG PO TBEC: 5.0000 mg | DELAYED_RELEASE_TABLET | Freq: Every day | ORAL | Status: DC | PRN

## 2023-02-04 MED ORDER — LACTATED RINGERS IV SOLN: INTRAVENOUS | Status: DC

## 2023-02-04 MED ORDER — CARBOXYMETHYLCELLULOSE SODIUM 0.5 % OP SOLN: 1.0000 [drp] | OPHTHALMIC | Status: DC

## 2023-02-04 MED ORDER — LOSARTAN POTASSIUM 25 MG PO TABS
25.0000 mg | ORAL_TABLET | Freq: Every day | ORAL | Status: DC
  Administered 2023-02-05 – 2023-02-09 (×5): 25 mg via ORAL
  Filled 2023-02-04 (×5): qty 1

## 2023-02-04 MED ORDER — ALUM & MAG HYDROXIDE-SIMETH 200-200-20 MG/5ML PO SUSP: 30.0000 mL | ORAL | Status: DC | PRN

## 2023-02-04 MED ORDER — SPIRONOLACTONE-HCTZ 25-25 MG PO TABS: 1.0000 | ORAL_TABLET | Freq: Every day | ORAL | Status: DC

## 2023-02-04 MED ORDER — APIXABAN 2.5 MG PO TABS
2.5000 mg | ORAL_TABLET | Freq: Two times a day (BID) | ORAL | Status: DC
  Administered 2023-02-05 – 2023-02-09 (×9): 2.5 mg via ORAL
  Filled 2023-02-04 (×9): qty 1

## 2023-02-04 MED ORDER — BUPIVACAINE-EPINEPHRINE (PF) 0.5% -1:200000 IJ SOLN
INTRAMUSCULAR | Status: DC | PRN
  Administered 2023-02-04: 20 mL

## 2023-02-04 MED ORDER — MIDAZOLAM HCL 2 MG/2ML IJ SOLN
INTRAMUSCULAR | Status: DC | PRN
  Administered 2023-02-04 (×2): 1 mg via INTRAVENOUS

## 2023-02-04 MED ORDER — MIDAZOLAM HCL 2 MG/2ML IJ SOLN
INTRAMUSCULAR | Status: AC
Start: 2023-02-04 — End: ?
  Filled 2023-02-04: qty 2

## 2023-02-04 MED ORDER — PHENYLEPHRINE HCL-NACL 20-0.9 MG/250ML-% IV SOLN
INTRAVENOUS | Status: DC | PRN
  Administered 2023-02-04: 20 ug/min via INTRAVENOUS

## 2023-02-04 MED ORDER — ONDANSETRON HCL 4 MG/2ML IJ SOLN: 4.0000 mg | Freq: Four times a day (QID) | INTRAMUSCULAR | Status: DC | PRN

## 2023-02-04 MED ORDER — PROPOFOL 10 MG/ML IV BOLUS
INTRAVENOUS | Status: DC | PRN
  Administered 2023-02-04 (×2): 10 mg via INTRAVENOUS
  Administered 2023-02-04: 20 mg via INTRAVENOUS
  Administered 2023-02-04: 60 ug/kg/min via INTRAVENOUS
  Administered 2023-02-04: 70 ug/kg/min via INTRAVENOUS
  Administered 2023-02-04: 20 mg via INTRAVENOUS

## 2023-02-04 MED ORDER — CEFAZOLIN SODIUM-DEXTROSE 2-4 GM/100ML-% IV SOLN
2.0000 g | INTRAVENOUS | Status: AC
  Administered 2023-02-04: 2 g via INTRAVENOUS
  Filled 2023-02-04: qty 100

## 2023-02-04 MED ORDER — DEXMEDETOMIDINE HCL IN NACL 80 MCG/20ML IV SOLN
INTRAVENOUS | Status: DC | PRN
  Administered 2023-02-04 (×6): 4 ug via INTRAVENOUS
  Administered 2023-02-04: 8 ug via INTRAVENOUS

## 2023-02-04 MED ORDER — SODIUM CHLORIDE 0.9 % IR SOLN
Status: DC | PRN
  Administered 2023-02-04: 1000 mL

## 2023-02-04 MED ORDER — POLYVINYL ALCOHOL 1.4 % OP SOLN
2.0000 [drp] | Freq: Every day | OPHTHALMIC | Status: DC
  Administered 2023-02-06 – 2023-02-09 (×4): 2 [drp] via OPHTHALMIC
  Filled 2023-02-04: qty 15

## 2023-02-04 MED ORDER — ORAL CARE MOUTH RINSE: 15.0000 mL | Freq: Once | OROMUCOSAL | Status: DC

## 2023-02-04 MED ORDER — OXYCODONE HCL 5 MG/5ML PO SOLN: 5.0000 mg | Freq: Once | ORAL | Status: DC | PRN

## 2023-02-04 MED ORDER — BUPIVACAINE-EPINEPHRINE 0.25% -1:200000 IJ SOLN
INTRAMUSCULAR | Status: AC
  Filled 2023-02-04: qty 1

## 2023-02-04 MED ORDER — ONDANSETRON HCL 4 MG/2ML IJ SOLN
4.0000 mg | Freq: Once | INTRAMUSCULAR | Status: DC | PRN
Start: 2023-02-04 — End: 2023-02-04

## 2023-02-04 MED ORDER — ATORVASTATIN CALCIUM 20 MG PO TABS
40.0000 mg | ORAL_TABLET | Freq: Every day | ORAL | Status: DC
  Administered 2023-02-05 – 2023-02-09 (×5): 40 mg via ORAL
  Filled 2023-02-04 (×5): qty 2

## 2023-02-04 SURGICAL SUPPLY — 51 items
ATTUNE PSFEM LTSZ4 NARCEM KNEE (Femur) IMPLANT
ATTUNE PSRP INSR SZ4 8 KNEE (Insert) IMPLANT
BAG COUNTER SPONGE SURGICOUNT (BAG) IMPLANT
BAG ZIPLOCK 12X15 (MISCELLANEOUS) ×1 IMPLANT
BASEPLATE TIBIAL ROTATING SZ 4 (Knees) IMPLANT
BENZOIN TINCTURE PRP APPL 2/3 (GAUZE/BANDAGES/DRESSINGS) ×1 IMPLANT
BLADE SAGITTAL 25.0X1.19X90 (BLADE) ×1 IMPLANT
BLADE SAW SGTL 13.0X1.19X90.0M (BLADE) ×1 IMPLANT
BLADE SURG SZ10 CARB STEEL (BLADE) ×2 IMPLANT
BNDG ELASTIC 6INX 5YD STR LF (GAUZE/BANDAGES/DRESSINGS) ×1 IMPLANT
BOOTIES KNEE HIGH SLOAN (MISCELLANEOUS) ×1 IMPLANT
BOWL SMART MIX CTS (DISPOSABLE) ×1 IMPLANT
CEMENT HV SMART SET (Cement) ×2 IMPLANT
COVER SURGICAL LIGHT HANDLE (MISCELLANEOUS) ×1 IMPLANT
CUFF TRNQT CYL 34X4.125X (TOURNIQUET CUFF) ×1 IMPLANT
DERMABOND ADVANCED .7 DNX12 (GAUZE/BANDAGES/DRESSINGS) IMPLANT
DRAPE INCISE IOBAN 66X45 STRL (DRAPES) ×1 IMPLANT
DRAPE U-SHAPE 47X51 STRL (DRAPES) ×1 IMPLANT
DRSG AQUACEL AG ADV 3.5X10 (GAUZE/BANDAGES/DRESSINGS) ×1 IMPLANT
DURAPREP 26ML APPLICATOR (WOUND CARE) ×1 IMPLANT
ELECT REM PT RETURN 15FT ADLT (MISCELLANEOUS) ×1 IMPLANT
GLOVE BIOGEL PI IND STRL 8 (GLOVE) ×2 IMPLANT
GLOVE ECLIPSE 7.5 STRL STRAW (GLOVE) ×2 IMPLANT
GOWN STRL REUS W/ TWL XL LVL3 (GOWN DISPOSABLE) ×2 IMPLANT
HOLDER FOLEY CATH W/STRAP (MISCELLANEOUS) IMPLANT
HOOD PEEL AWAY T7 (MISCELLANEOUS) ×3 IMPLANT
KIT TURNOVER KIT A (KITS) IMPLANT
MANIFOLD NEPTUNE II (INSTRUMENTS) ×1 IMPLANT
NDL HYPO 22X1.5 SAFETY MO (MISCELLANEOUS) ×2 IMPLANT
NEEDLE HYPO 22X1.5 SAFETY MO (MISCELLANEOUS) ×2
NS IRRIG 1000ML POUR BTL (IV SOLUTION) ×1 IMPLANT
PACK TOTAL KNEE CUSTOM (KITS) ×1 IMPLANT
PADDING CAST COTTON 6X4 STRL (CAST SUPPLIES) ×1 IMPLANT
PASSER SUT SWANSON 36MM LOOP (INSTRUMENTS) IMPLANT
PATELLA MEDIAL ATTUN 35MM KNEE (Knees) IMPLANT
PIN STEINMAN FIXATION KNEE (PIN) IMPLANT
PROTECTOR NERVE ULNAR (MISCELLANEOUS) ×1 IMPLANT
RETRIEVER SUT HEWSON (MISCELLANEOUS) IMPLANT
SET HNDPC FAN SPRY TIP SCT (DISPOSABLE) ×1 IMPLANT
SPIKE FLUID TRANSFER (MISCELLANEOUS) ×2 IMPLANT
STRIP CLOSURE SKIN 1/2X4 (GAUZE/BANDAGES/DRESSINGS) IMPLANT
SUT MNCRL AB 3-0 PS2 18 (SUTURE) ×1 IMPLANT
SUT VIC AB 0 CT1 36 (SUTURE) ×1 IMPLANT
SUT VIC AB 1 CT1 36 (SUTURE) ×2 IMPLANT
SUT VIC AB 2-0 CT1 TAPERPNT 27 (SUTURE) ×1 IMPLANT
SYR CONTROL 10ML LL (SYRINGE) ×2 IMPLANT
TAPE FIBER 2MM 7IN #2 BLUE (SUTURE) IMPLANT
TRAY CATH INTERMITTENT SS 16FR (CATHETERS) IMPLANT
TUBE SUCTION HIGH CAP CLEAR NV (SUCTIONS) ×1 IMPLANT
WATER STERILE IRR 1000ML POUR (IV SOLUTION) ×2 IMPLANT
WRAP KNEE MAXI GEL POST OP (GAUZE/BANDAGES/DRESSINGS) ×1 IMPLANT

## 2023-02-04 NOTE — Transfer of Care (Signed)
Immediate Anesthesia Transfer of Care Note  Patient: Aaleah Hirsch  Procedure(s) Performed: TOTAL KNEE ARTHROPLASTY, OPEN REDUCTION INTERNAL FIXATION OF MEDIAL FEMORAL CONDYLE (Left: Knee)  Patient Location: PACU  Anesthesia Type:MAC, Regional, and Spinal  Level of Consciousness: awake, alert , and oriented  Airway & Oxygen Therapy: Patient Spontanous Breathing  Post-op Assessment: Report given to RN and Post -op Vital signs reviewed and stable  Post vital signs: Reviewed and stable  Last Vitals:  Vitals Value Taken Time  BP 125/72 02/04/23 1715  Temp 36.3 C 02/04/23 1715  Pulse 79 02/04/23 1719  Resp 10 02/04/23 1719  SpO2 94 % 02/04/23 1719  Vitals shown include unfiled device data.  Last Pain:  Vitals:   02/04/23 1715  TempSrc:   PainSc: 9          Complications: No notable events documented.

## 2023-02-04 NOTE — Interval H&P Note (Signed)
History and Physical Interval Note:  02/04/2023 1:32 PM  Tina Bryant  has presented today for surgery, with the diagnosis of LEFT KNEE OSTEOARTHRITIS.  The various methods of treatment have been discussed with the patient and family. After consideration of risks, benefits and other options for treatment, the patient has consented to  Procedure(s): TOTAL KNEE ARTHROPLASTY (Left) as a surgical intervention.  The patient's history has been reviewed, patient examined, no change in status, stable for surgery.  I have reviewed the patient's chart and labs.  Questions were answered to the patient's satisfaction.     Harvie Junior

## 2023-02-04 NOTE — Op Note (Signed)
PATIENT ID:      Tina Bryant  MRN:     [id number] DOB/AGE:    Oct 31, 1945 / 78 y.o.       OPERATIVE REPORT   DATE OF PROCEDURE:  02/04/2023      PREOPERATIVE DIAGNOSIS:   LEFT KNEE OSTEOARTHRITIS      Estimated body mass index is 30.25 kg/m as calculated from the following:   Height as of this encounter: 5\' 6"  (1.676 m).   Weight as of this encounter: 85 kg.                                                       POSTOPERATIVE DIAGNOSIS: #1 left knee osteoarthritis severe end-stage.  #2 intraoperative medial femoral condyle fracture.                                                                 PROCEDURE:  Procedure(s): TOTAL KNEE ARTHROPLASTY, Using DepuyAttune RP implants #4N Femur, #4Tibia, 8 mm Attune RP bearing, 35 Patella #2 OPEN REDUCTION INTERNAL FIXATION OF MEDIAL FEMORAL CONDYLE fracture with fiber tape from the box cut to the medial condyle.    SURGEON: Harvie Junior  ASSISTANT:   Gus Puma PA-C   (Present and scrubbed throughout the case, critical for assistance with exposure, retraction, instrumentation, and closure.)        ANESTHESIA: spinal, 20cc Exparel, 50cc 0.25% Marcaine EBL: min cc FLUID REPLACEMENT: unk cc crystaloid TOURNIQUET: DRAINS: None TRANEXAMIC ACID: 1gm IV, 2gm topical COMPLICATIONS:  None         INDICATIONS FOR PROCEDURE: The patient has  LEFT KNEE OSTEOARTHRITIS, significant valgus deformities, XR shows bone on bone arthritis, lateral subluxation of tibia. Patient has failed all conservative measures including anti-inflammatory medicines, narcotics, attempts at exercise and weight loss, cortisone injections and viscosupplementation.  Risks and benefits of surgery have been discussed, questions answered.   DESCRIPTION OF PROCEDURE: The patient identified by armband, received  IV antibiotics, in the holding area at [facility]. Patient taken to the operating room, appropriate anesthetic monitors were attached, and spinal anesthesia  was  induced. IV Tranexamic acid was given. Lateral post and 2 surefoot positioners applied to the table, the lower extremity was then prepped and draped in usual sterile fashion from the toes to the high thigh. Time-out procedure was performed.  Gus Puma PAC, was present and scrubbed throughout the case, critical for assistance with, positioning, exposure, retraction, instrumentation, and closure.The skin and subcutaneous tissue along the incision was injected with 20 cc of a mixture of 20cc Exparel and 30cc Marcaine 50cc saline solution, using a 21-gauge by 1-1/2 inch needle. We began the operation, with the knee flexed 130 degrees, by making the anterior midline incision starting at handbreadth above the patella going over the patella 1 cm medial to and 4 cm distal to the tibial tubercle. Small bleeders in the skin and the subcutaneous tissue identified and cauterized. Transverse retinaculum was incised and reflected medially and a medial parapatellar arthrotomy was accomplished. the patella was everted and theprepatellar fat pad resected. The superficial medial collateral ligament was then  elevated from anterior to posterior along the proximal flare of the tibia and anterior half of the menisci resected. The knee was hyperflexed exposing bone on bone arthritis. Peripheral and notch osteophytes as well as the cruciate ligaments were then resected. We continued to work our way around posteriorly along the proximal tibia, and externally rotated the tibia subluxing it out from underneath the femur. A McHale PCL retractor was placed through the notch, a lateral Hohmann retractor, and anterolateral small homan retractor placed. We then entered the proximal tibia with the Depuy starter drill in line with the axis of the tibia followed by an intramedullary guide rod and 3-degree posterior slope cutting guide. The tibial cutting guide, was pinned into place allowing resection of 2 mm of bone medially and 10 mm of bone  laterally. Satisfied with the tibial resection, we then entered the distal femur 2 mm anterior to the PCL origin with the starter drill, followed by the intramedullary guide rod and applied the distal femoral cutting guide set at 9 mm, with 5 degrees of valgus. This was pinned along the epicondylar axis. At this point, the distal femoral cut was accomplished without difficulty. We then sized for a #4N femoral component and pinned the chamfer guide in 3 degrees of external rotation. The anterior, posterior, and chamfer cuts were accomplished without difficulty followed by the Attune RP box cutting guide and the box cut.  Unfortunately after making the distal femoral cut and we were flexing up for the anterior and posterior cuts with a box in place the medial femoral condyle fractured in essence rupturing the medial collateral ligament and it was about half of the medial femoral condyle.  At that point I felt that we would definitely need to repair the medial femoral condyle but we continued on with the surgical intervention we also removed posterior osteophytes from the posterior femoral condyles. The posterior capsule was injected with Exparel solution. The knee was brought into full extension. We checked our extension gap and fit a 8 mm trial lollipop after releasing the iliotibial band and posterolateral capsule which was all tight and causing her to be in a lateral position. Distracting in extension with a lamina spreader,  bleeders in the posterior capsule, Posterior medial and posterior lateral gutter were cauterized.  The transexamic acid-soaked sponge was then placed in the gap of the knee in extension. The knee was flexed 30. The posterior patella cut was accomplished with the 9.5 mm Attune cutting guide, sized for a 35 mm dome, and the fixation pegs drilled.The knee was then once again hyperflexed exposing the proximal tibia. We sized for a # 4 tibial base plate, applied the smokestack and the conical  reamer followed by the the Delta fin keel punch. We then hammered into place the Attune RP trial femoral component, drilled the lugs, inserted a  8 mm trial bearing, trial patellar button, and took the knee through range of motion from 0-130 degrees. Medial and lateral ligamentous stability was checked. No thumb pressure was required for patellar Tracking.  All trial components were removed, mating surfaces irrigated with pulse lavage, and dried with suction and sponges. 10 cc of the Exparel solution was applied to the cancellus bone of the patella distal femur and proximal tibia.  At this point I opened a fiber tape and we held the medial condyle in an anatomically reduced position and then drilled from the medial condyle into the box area.  Care was taken to not go through the hole  for the pegs on the femur.  We drilled outside and then passed the suture from inside out and then drilled from outside and used a suture passer to bring a suture back through and then palpable that suture into the proximal area.  I then tied it freehand getting an anatomic reduction of the medial femoral condyle fracture and as importantly getting excellent tension on the medial collateral ligament.  I put the knee through a range of motion at this point and really felt that there was excellent fixation achieved.  After waiting 30 seconds, the bony surfaces were again, dried with sponges. A double batch of DePuy HV cement was mixed and applied to all bony metallic mating surfaces except for the posterior condyles of the femur itself. In order, we hammered into place the tibial tray and removed excess cement, the femoral component and removed excess cement. The final Attune RP bearing was inserted, and the knee brought to full extension with compression. The patellar button was clamped into place, and excess cement removed. The knee was held at 30 flexion with compression using the second surefoot, while the cement cured. The wound was  irrigated out with normal saline solution pulse lavage. The rest of the Exparel was injected into the parapatellar arthrotomy, subcutaneous tissues, and periosteal tissues. The parapatellar arthrotomy was closed with running #1 Vicryl suture. The subcutaneous tissue with 3-0 undyed Vicryl suture, and the skin with running 3-0 SQ vicryl. An Aquacil dressing and Ace wrap were applied. The patient was taken to recovery room without difficulty.   Harvie Junior 02/04/2023, 4:33 PM

## 2023-02-04 NOTE — Anesthesia Procedure Notes (Signed)
Anesthesia Regional Block: Adductor canal block   Pre-Anesthetic Checklist: , timeout performed,  Correct Patient, Correct Site, Correct Laterality,  Correct Procedure, Correct Position, site marked,  Risks and benefits discussed,  Surgical consent,  Pre-op evaluation,  At surgeon's request and post-op pain management  Laterality: Left  Prep: Maximum Sterile Barrier Precautions used, chloraprep       Needles:  Injection technique: Single-shot  Needle Type: Echogenic Needle      Needle Gauge: 20     Additional Needles:   Procedures:,,,, ultrasound used (permanent image in chart),,    Narrative:  Start time: 02/04/2023 2:15 PM End time: 02/04/2023 2:17 PM Injection made incrementally with aspirations every 5 mL.  Performed by: Personally  Anesthesiologist: [name], MD

## 2023-02-04 NOTE — Anesthesia Preprocedure Evaluation (Signed)
Anesthesia Evaluation  Patient identified by MRN, date of birth, ID band Patient awake    Reviewed: Allergy & Precautions, NPO status , Patient's Chart, lab work & pertinent test results, reviewed documented beta blocker date and time   History of Anesthesia Complications Negative for: history of anesthetic complications  Airway Mallampati: II  TM Distance: >3 FB     Dental  (+) Upper Dentures   Pulmonary pneumonia Prior DVT   breath sounds clear to auscultation       Cardiovascular hypertension, (-) CAD, (-) Past MI, (-) Cardiac Stents and (-) CABG  Rhythm:Regular Rate:Normal     Neuro/Psych  PSYCHIATRIC DISORDERS  Depression       GI/Hepatic ,,,(+) Hepatitis -  Endo/Other  diabetes, Type 2    Renal/GU CRFRenal disease     Musculoskeletal  (+) Arthritis ,    Abdominal   Peds  Hematology  (+) Blood dyscrasia, anemia Previously on eliquis, reports she has not taken in months   Anesthesia Other Findings   Reproductive/Obstetrics                             Anesthesia Physical Anesthesia Plan  ASA: 2  Anesthesia Plan: Spinal   Post-op Pain Management: Regional block*   Induction:   PONV Risk Score and Plan: 2 and Ondansetron and Propofol infusion  Airway Management Planned: Nasal Cannula and Natural Airway  Additional Equipment:   Intra-op Plan:   Post-operative Plan:   Informed Consent: I have reviewed the patients History and Physical, chart, labs and discussed the procedure including the risks, benefits and alternatives for the proposed anesthesia with the patient or authorized representative who has indicated his/her understanding and acceptance.     Dental advisory given  Plan Discussed with: CRNA  Anesthesia Plan Comments:        Anesthesia Quick Evaluation

## 2023-02-04 NOTE — Discharge Instructions (Signed)

## 2023-02-04 NOTE — Anesthesia Procedure Notes (Signed)
Spinal  Patient location during procedure: OR Start time: 02/04/2023 2:43 PM End time: 02/04/2023 2:46 PM Reason for block: surgical anesthesia Staffing Anesthesiologist: [name], MD Performed by: [name], MD Authorized by: [name], MD   Preanesthetic Checklist Completed: patient identified, IV checked, site marked, risks and benefits discussed, surgical consent, monitors and equipment checked, pre-op evaluation and timeout performed Spinal Block Patient position: sitting Prep: DuraPrep Patient monitoring: heart rate, cardiac monitor, continuous pulse ox and blood pressure Approach: midline Location: L3-4 Injection technique: single-shot Needle Needle type: Sprotte  Needle gauge: 24 G Needle length: 9 cm Assessment Sensory level: T4 Events: CSF return

## 2023-02-05 ENCOUNTER — Encounter (HOSPITAL_COMMUNITY): Payer: Self-pay | Admitting: Orthopedic Surgery

## 2023-02-05 DIAGNOSIS — M1712 Unilateral primary osteoarthritis, left knee: Secondary | ICD-10-CM | POA: Diagnosis not present

## 2023-02-05 MED ORDER — PHENYLEPHRINE HCL-NACL 20-0.9 MG/250ML-% IV SOLN
INTRAVENOUS | Status: AC
  Filled 2023-02-05: qty 500

## 2023-02-05 NOTE — Progress Notes (Signed)
Physical Therapy Treatment Patient Details Name: Tina Bryant MRN: [id number] DOB: 1945/04/30 Today's Date: 02/05/2023   History of Present Illness Lequisha Wohlfarth is a 78 y.o. female L TKA and ORIF medial femoral condyle 02/04/23. PMH: CKD, DVT, hepatitis B, HTN    PT Comments  Pt needing BSC to void bladder at beginning of session, mod A to power up, again verbal cues for hand placement and LE activation to assist in powering up. Pt amb 70 ft x2 with RW, step to gait pattern, no knee buckling noted, appears to not fully extend either leg in stance phase and with RLE increased external rotation, recliner follow per pt's request due to high pain and needing seated rest break. Upon return to room, pt's lunch tray arrived and pt wishing to eat; all needs in reach and daughter in room to assist as needed. Pt reports 9/10 with mobility. Plan to continue progressing mobility and complete stair training prior to return home with cousin supporting.   If plan is discharge home, recommend the following: Assistance with cooking/housework;Assist for transportation;Help with stairs or ramp for entrance;A little help with walking and/or transfers;A little help with bathing/dressing/bathroom   Can travel by private vehicle        Equipment Recommendations  BSC/3in1    Recommendations for Other Services       Precautions / Restrictions Precautions Precautions: Fall;Knee Restrictions Weight Bearing Restrictions Per Provider Order: No LLE Weight Bearing Per Provider Order: Weight bearing as tolerated     Mobility  Bed Mobility General bed mobility comments: in recliner upon arrival    Transfers Overall transfer level: Needs assistance Equipment used: Rolling walker (2 wheels) Transfers: Sit to/from Stand Sit to Stand: Mod assist   Step pivot transfers: Mod assist       General transfer comment: mod A to power to stand from low seated recliner and BSC, LLE slightly extended for comfort,  slow to power up and slide LLE back, verbal cues for hand placement with transfers    Ambulation/Gait Ambulation/Gait assistance: Contact guard assist, +2 safety/equipment Gait Distance (Feet): 70 Feet (x2) Assistive device: Rolling walker (2 wheels) Gait Pattern/deviations: Step-to pattern, Decreased stride length, Decreased stance time - left, Decreased weight shift to left Gait velocity: decreased     General Gait Details: step-to gait pattern with decreased LLE stance time and weight-bearing, no knee buckling noted, lacking full knee extension bilaterally and RLE with increased external rotation, seated rest break due to high pain reports; chair follow for safety as pt needing seated rest break   Stairs             Wheelchair Mobility     Tilt Bed    Modified Rankin (Stroke Patients Only)       Balance Overall balance assessment: Needs assistance Sitting-balance support: Feet supported Sitting balance-Leahy Scale: Good     Standing balance support: Reliant on assistive device for balance, During functional activity, Bilateral upper extremity supported Standing balance-Leahy Scale: Poor                              Cognition Arousal: Alert Behavior During Therapy: WFL for tasks assessed/performed Overall Cognitive Status: Within Functional Limits for tasks assessed  Exercises      General Comments General comments (skin integrity, edema, etc.): VSS on RA      Pertinent Vitals/Pain Pain Assessment Pain Assessment: 0-10 Pain Score: 9  Faces Pain Scale: Hurts even more Pain Location: L knee Pain Descriptors / Indicators: Grimacing, Guarding, Sore Pain Intervention(s): Limited activity within patient's tolerance, Monitored during session, Premedicated before session, Repositioned, Ice applied    Home Living Family/patient expects to be discharged to:: Private residence Living  Arrangements: Alone Available Help at Discharge: Family;Available 24 hours/day Type of Home: Apartment Home Access: Stairs to enter Entrance Stairs-Rails: Left Entrance Stairs-Number of Steps: 2   Home Layout: One level Home Equipment: Wheelchair - manual;Cane - single point;Rollator (4 wheels);Rolling Walker (2 wheels)      Prior Function            PT Goals (current goals can now be found in the care plan section) Acute Rehab PT Goals Patient Stated Goal: return home with cousin supporting, HHPT PT Goal Formulation: With patient/family Time For Goal Achievement: 02/19/23 Potential to Achieve Goals: Good Progress towards PT goals: Progressing toward goals    Frequency    7X/week      PT Plan      Co-evaluation              AM-PAC PT "6 Clicks" Mobility   Outcome Measure  Help needed turning from your back to your side while in a flat bed without using bedrails?: A Little Help needed moving from lying on your back to sitting on the side of a flat bed without using bedrails?: A Little Help needed moving to and from a bed to a chair (including a wheelchair)?: A Little Help needed standing up from a chair using your arms (e.g., wheelchair or bedside chair)?: A Little Help needed to walk in hospital room?: A Little Help needed climbing 3-5 steps with a railing? : Total 6 Click Score: 16    End of Session Equipment Utilized During Treatment: Gait belt Activity Tolerance: Patient tolerated treatment well;Patient limited by pain Patient left: in chair;with call bell/phone within reach;with chair alarm set;with family/visitor present Nurse Communication: Mobility status (voided bladder in [facility] hat) PT Visit Diagnosis: Muscle weakness (generalized) (M62.81);Pain;Difficulty in walking, not elsewhere classified (R26.2) Pain - Right/Left: Left Pain - part of body: Knee     Time: 1610-9604 PT Time Calculation (min) (ACUTE ONLY): 27 min  Charges:    $Gait  Training: 8-22 mins $Therapeutic Activity: 8-22 mins PT General Charges $$ ACUTE PT VISIT: 1 Visit                      Tori Jayliana Valencia PT, DPT 02/05/23, 1:23 PM

## 2023-02-05 NOTE — Evaluation (Signed)
Physical Therapy Evaluation Patient Details Name: Tina Bryant MRN: [id number] DOB: [birth year] Today's Date: 02/05/2023  History of Present Illness  Tina Bryant is a 78 y.o. female L TKA and ORIF medial femoral condyle 02/04/23. PMH: CKD, DVT, hepatitis B, HTN  Clinical Impression  Pt is s/p TKA resulting in the deficits listed below (see PT Problem List). Pt ind with RW or rollator prior to surgery, ind with self care and household chores, lives with cousin who is the same age, daughter present on eval lives in [place]. Pt reports 2 steps to enter apartment with handrail and low seated toilets making it difficult to power up off of them. On eval, pt needing mod A with bed mobility, slow to mobilize to EOB, using bedrail and elevated HOB. Pt needing in A to power up from elevated bedside, strong urinary urge so transferred to [employer]; mod A to power up from lower seated BSC, able to take step pivot back to EOB. Pt with pain and wanting to eat breakfast to step pivot EOB to recliner, min A to power up from elevated bed and step pivot to recliner. Plan to return for ambulation and HEP education with hopes to d/c home with family support once pt able to safely ascend/descend 2 steps.  Pt will benefit from acute skilled PT to increase their independence and safety with mobility to allow discharge.          If plan is discharge home, recommend the following: A lot of help with walking and/or transfers;A lot of help with bathing/dressing/bathroom;Assistance with cooking/housework;Assist for transportation;Help with stairs or ramp for entrance   Can travel by private vehicle        Equipment Recommendations BSC/3in1  Recommendations for Other Services       Functional Status Assessment Patient has had a recent decline in their functional status and demonstrates the ability to make significant improvements in function in a reasonable and predictable amount of time.     Precautions /  Restrictions Precautions Precautions: Fall;Knee Restrictions Weight Bearing Restrictions Per Provider Order: No LLE Weight Bearing Per Provider Order: Weight bearing as tolerated      Mobility  Bed Mobility Overal bed mobility: Needs Assistance Bed Mobility: Supine to Sit     Supine to sit: Mod assist, HOB elevated, Used rails     General bed mobility comments: increased time and effort, therapist assisting to slowly mobilize LLE to EOB, pt using bedrails and elevated HOB to assist trunk to EOB    Transfers Overall transfer level: Needs assistance Equipment used: Rolling walker (2 wheels) Transfers: Sit to/from Stand, Bed to chair/wheelchair/BSC Sit to Stand: Min assist, From elevated surface, Mod assist   Step pivot transfers: Min assist, Mod assist       General transfer comment: min A to power up from elevated bed and mod A to power up from lower seated BSC, verbal cues for hand placement to assist in powering up, cues for LE engagement to assist in rising, pt slow with step pivot from bed to [facility] then BSC to bed and lastly bed to recliner, no LE buckling noted with step pivot, pt able to clear each foot with steps with UE use on RW to decrease weight in LLE    Ambulation/Gait               General Gait Details: not attempted- needing restroom then breakfast tray arrived  [employer]  Tilt Bed    Modified Rankin (Stroke Patients Only)       Balance Overall balance assessment: Needs assistance Sitting-balance support: Feet supported Sitting balance-Leahy Scale: Good     Standing balance support: Reliant on assistive device for balance, During functional activity, Bilateral upper extremity supported Standing balance-Leahy Scale: Poor                               Pertinent Vitals/Pain Pain Assessment Pain Assessment: Faces Faces Pain Scale: Hurts even more Pain Location: L knee Pain Descriptors /  Indicators: Grimacing, Guarding, Sore Pain Intervention(s): Limited activity within patient's tolerance, Monitored during session, Premedicated before session, Repositioned, Ice applied    Home Living Family/patient expects to be discharged to:: Private residence Living Arrangements: Alone Available Help at Discharge: Family;Available 24 hours/day Type of Home: Apartment Home Access: Stairs to enter Entrance Stairs-Rails: Left Entrance Stairs-Number of Steps: 2   Home Layout: One level Home Equipment: Wheelchair - manual;Cane - single point;Rollator (4 wheels);Rolling Walker (2 wheels)      Prior Function Prior Level of Function : Independent/Modified Independent             Mobility Comments: pt reports ind with RW or rollator at home ADLs Comments: pt reports ind with self care and household chores     Extremity/Trunk Assessment   Upper Extremity Assessment Upper Extremity Assessment: Overall WFL for tasks assessed    Lower Extremity Assessment Lower Extremity Assessment: LLE deficits/detail LLE Deficits / Details: ankle AROM WFL, quad set noted, unable to perform SLR without assistance LLE Sensation: WNL LLE Coordination: WNL    Cervical / Trunk Assessment Cervical / Trunk Assessment: Normal  Communication   Communication Communication: No apparent difficulties  Cognition Arousal: Alert Behavior During Therapy: WFL for tasks assessed/performed Overall Cognitive Status: Within Functional Limits for tasks assessed                                          General Comments General comments (skin integrity, edema, etc.): VSS on RA    Exercises     Assessment/Plan    PT Assessment Patient needs continued PT services  PT Problem List Decreased strength;Decreased range of motion;Decreased activity tolerance;Decreased balance;Decreased mobility;Decreased knowledge of use of DME;Pain       PT Treatment Interventions DME instruction;Gait  training;Stair training;Functional mobility training;Therapeutic activities;Therapeutic exercise;Balance training;Neuromuscular re-education;Patient/family education;Modalities    PT Goals (Current goals can be found in the Care Plan section)  Acute Rehab PT Goals Patient Stated Goal: return home with cousin supporting, HHPT PT Goal Formulation: With patient/family Time For Goal Achievement: 02/19/23 Potential to Achieve Goals: Good    Frequency 7X/week     Co-evaluation               AM-PAC PT "6 Clicks" Mobility  Outcome Measure Help needed turning from your back to your side while in a flat bed without using bedrails?: A Little Help needed moving from lying on your back to sitting on the side of a flat bed without using bedrails?: A Lot Help needed moving to and from a bed to a chair (including a wheelchair)?: A Lot Help needed standing up from a chair using your arms (e.g., wheelchair or bedside chair)?: A Lot Help needed to walk in hospital room?: A Lot Help needed climbing 3-5 steps with  a railing? : Total 6 Click Score: 12    End of Session Equipment Utilized During Treatment: Gait belt Activity Tolerance: Patient tolerated treatment well;Patient limited by pain Patient left: in chair;with call bell/phone within reach;with chair alarm set;with family/visitor present Nurse Communication: Mobility status PT Visit Diagnosis: Muscle weakness (generalized) (M62.81);Pain;Difficulty in walking, not elsewhere classified (R26.2) Pain - Right/Left: Left Pain - part of body: Knee    Time: 0454-0981 PT Time Calculation (min) (ACUTE ONLY): 35 min   Charges:   PT Evaluation $PT Eval Low Complexity: 1 Low PT Treatments $Therapeutic Activity: 8-22 mins PT General Charges $$ ACUTE PT VISIT: 1 Visit         Tori Markeshia Giebel PT, DPT 02/05/23, 11:10 AM

## 2023-02-05 NOTE — Progress Notes (Signed)
Subjective: 1 Day Post-Op Procedure(s) (LRB): TOTAL KNEE ARTHROPLASTY, OPEN REDUCTION INTERNAL FIXATION OF MEDIAL FEMORAL CONDYLE (Left) Patient reports pain as moderate.  Foley out this morning.  Taking fluids okay.  Daughter at bedside.  Has not been out of bed yet.  Objective: Vital signs in last 24 hours: Temp:  [97.3 F (36.3 C)-98.2 F (36.8 C)] 97.7 F (36.5 C) (01/28 0409) Pulse Rate:  [51-93] 65 (01/28 0409) Resp:  [12-18] 16 (01/28 0409) BP: (109-172)/(63-86) 172/85 (01/28 0409) SpO2:  [98 %-100 %] 100 % (01/28 0409) Weight:  [85 kg] 85 kg (01/27 1302)  Intake/Output from previous day: 01/27 0701 - 01/28 0700 In: 2270.8 [P.O.:480; I.V.:1390.8; IV Piggyback:400] Out: 1270 [Urine:1250; Blood:20] Intake/Output this shift: No intake/output data recorded.  No results for input(s): "HGB" in the last 72 hours. No results for input(s): "WBC", "RBC", "HCT", "PLT" in the last 72 hours. No results for input(s): "NA", "K", "CL", "CO2", "BUN", "CREATININE", "GLUCOSE", "CALCIUM" in the last 72 hours. No results for input(s): "LABPT", "INR" in the last 72 hours. Left knee exam: Neurovascular intact Sensation intact distally Intact pulses distally Dorsiflexion/Plantar flexion intact Incision: dressing C/D/I No cellulitis present Compartment soft   Assessment/Plan: 1 Day Post-Op Procedure(s) (LRB): TOTAL KNEE ARTHROPLASTY, OPEN REDUCTION INTERNAL FIXATION OF MEDIAL FEMORAL CONDYLE (Left) Plan: Up with therapy Discharge home with home health tomorrow. Weight-bear as tolerated on left lower extremity. Will start Eliquis 2.5 mg twice daily x 3 weeks postop since patient has a history of clot.  Matthew Folks 02/05/2023, 8:18 AM

## 2023-02-06 DIAGNOSIS — M1712 Unilateral primary osteoarthritis, left knee: Secondary | ICD-10-CM | POA: Diagnosis not present

## 2023-02-06 MED ORDER — APIXABAN 2.5 MG PO TABS: 2.5000 mg | ORAL_TABLET | Freq: Two times a day (BID) | ORAL | 0 refills | Status: AC

## 2023-02-06 MED ORDER — OXYCODONE-ACETAMINOPHEN 5-325 MG PO TABS: 1.0000 | ORAL_TABLET | ORAL | Status: AC | PRN

## 2023-02-06 MED ORDER — DOCUSATE SODIUM 100 MG PO CAPS: 100.0000 mg | ORAL_CAPSULE | Freq: Two times a day (BID) | ORAL | 0 refills | Status: AC

## 2023-02-06 MED ORDER — TIZANIDINE HCL 2 MG PO TABS: 2.0000 mg | ORAL_TABLET | Freq: Three times a day (TID) | ORAL | 0 refills | Status: AC | PRN

## 2023-02-06 NOTE — Plan of Care (Signed)
Problem: Education: Goal: Knowledge of General Education information will improve Description Including pain rating scale, medication(s)/side effects and non-pharmacologic comfort measures Outcome: Progressing

## 2023-02-06 NOTE — Progress Notes (Addendum)
Subjective: 2 Days Post-Op Procedure(s) (LRB): TOTAL KNEE ARTHROPLASTY, OPEN REDUCTION INTERNAL FIXATION OF MEDIAL FEMORAL CONDYLE (Left) Patient reports pain as moderate.  Taking by mouth and voiding okay.  She is getting up to the bathroom without difficulty.  Progressing with physical therapy.  She states she wants to go home today.  Objective: Vital signs in last 24 hours: Temp:  [98 F (36.7 C)-98.5 F (36.9 C)] 98.5 F (36.9 C) (01/29 0805) Pulse Rate:  [77-88] 85 (01/29 0805) Resp:  [16-20] 16 (01/29 0805) BP: (153-182)/(70-94) 182/94 (01/29 0805) SpO2:  [99 %-100 %] 99 % (01/29 0805)  Intake/Output from previous day: 01/28 0701 - 01/29 0700 In: 370 [P.O.:370] Out: 400 [Urine:400] Intake/Output this shift: No intake/output data recorded.  No results for input(s): "HGB" in the last 72 hours. No results for input(s): "WBC", "RBC", "HCT", "PLT" in the last 72 hours. No results for input(s): "NA", "K", "CL", "CO2", "BUN", "CREATININE", "GLUCOSE", "CALCIUM" in the last 72 hours. No results for input(s): "LABPT", "INR" in the last 72 hours. Left knee exam: Neurovascular intact Sensation intact distally Intact pulses distally No cellulitis present Compartment soft   Assessment/Plan: 2 Days Post-Op Procedure(s) (LRB): TOTAL KNEE ARTHROPLASTY, OPEN REDUCTION INTERNAL FIXATION OF MEDIAL FEMORAL CONDYLE (Left) Plan: Eliquis 2.5 mg twice daily x 3 weeks postop for DVT prophylaxis. Discharge home with home health Weight-bear as tolerated on left lower extremity. Follow-up with Dr. Luiz Blare in 2 weeks. Home health physical therapy will start in 3 to 5 days.  We are aware of this.  Anticipated LOS equal to or greater than 2 midnights due to - Age 80 and older with one or more of the following:  - Obesity  - Expected need for hospital services (PT, OT, Nursing) required for safe  discharge  - Anticipated need for postoperative skilled nursing care or inpatient rehab  - Active  co-morbidities: History of DVT OR   - Unanticipated findings during/Post Surgery: Slow post-op progression: GI, pain control, mobility  - Patient is a high risk of re-admission due to: Barriers to post-acute care (logistical, no family support in home)   Matthew Folks 02/06/2023, 12:48 PM

## 2023-02-06 NOTE — Anesthesia Postprocedure Evaluation (Addendum)
Anesthesia Post Note  Patient: Tina Bryant  Procedure(s) Performed: TOTAL KNEE ARTHROPLASTY, OPEN REDUCTION INTERNAL FIXATION OF MEDIAL FEMORAL CONDYLE (Left: Knee)     Patient location during evaluation: PACU Anesthesia Type: Spinal Level of consciousness: awake and alert Pain management: pain level controlled Vital Signs Assessment: post-procedure vital signs reviewed and stable Respiratory status: spontaneous breathing, nonlabored ventilation, respiratory function stable and patient connected to nasal cannula oxygen Cardiovascular status: stable and blood pressure returned to baseline Postop Assessment: no apparent nausea or vomiting Anesthetic complications: no   No notable events documented.  Last Vitals:  Vitals:   02/06/23 0617 02/06/23 0805  BP: (!) 153/78 (!) 182/94  Pulse: 77 85  Resp: 20 16  Temp: 36.7 C 36.9 C  SpO2: 100% 99%    Last Pain:  Vitals:   02/06/23 0805  TempSrc: Oral  PainSc:                  [name]

## 2023-02-06 NOTE — TOC Progression Note (Signed)
Transition of Care [facility]) - Progression Note    Patient Details  Name: Tina Bryant MRN: [id number] Date of Birth: June 10, 1945  [facility]) CM/SW Contact  Amada Jupiter, LCSW Phone Number: 02/06/2023, 2:56 PM  Clinical Narrative:     Met with pt and daughter yesterday to review dc needs and confirming need for 3n1 commode and HHPT follow up.  No agency preferences.  3n1 commode ordered via [employer] and HHPT set up with [facility] with a known delay of start care of 3-5 days. This afternoon, alerted by PT that pt does need a RW as the one pt planned to use at home actually belongs to her 58 yr old cousin WHO IS STILL USING IT.  Spoke with pt's daughter again today and she is expressing concern about pt discharging home at current functional levels.  Notes that the only person in the home with pt is the elderly cousin who cannot provide physical assistance.  Per PT session this afternoon, PT in agreement that pt would need a capable caregiver in the home.  Have reached out to Gus Puma, [place] to discuss and awaiting return call.        Expected Discharge Plan and Services         Expected Discharge Date: 02/06/23                                     Social Determinants of Health (SDOH) Interventions SDOH Screenings   Food Insecurity: No Food Insecurity (02/05/2023)  Housing: Low Risk  (02/05/2023)  Transportation Needs: No Transportation Needs (02/05/2023)  Utilities: Not At Risk (02/05/2023)  Social Connections: Patient Declined (02/05/2023)  Tobacco Use: Unknown (02/04/2023)    Readmission Risk Interventions    11/06/2022   11:07 AM  Readmission Risk Prevention Plan  Post Dischage Appt Complete  Medication Screening Complete  Transportation Screening Complete

## 2023-02-06 NOTE — Progress Notes (Signed)
Physical Therapy Treatment Patient Details Name: Tina Bryant MRN: [id number] DOB: 1945-03-29 Today's Date: 02/06/2023   History of Present Illness Tina Bryant is a 78 y.o. female L TKA and ORIF medial femoral condyle 02/04/23. PMH: CKD, DVT, hepatitis B, HTN    PT Comments  Pt is progressing , however continues to require min-mod assist for transfers and bed mobility. Pt son reports her 30 yo caregiver cannot physically provide any assistance.  Pt was able to ascend 2 steps with RW and min assist using posterior technique. Discussed with RN and TOC, TOC reaching out to MD/PA regardign family concerns regarding d/c, they are requesting SNF. Will follow    If plan is discharge home, recommend the following: Assistance with cooking/housework;Assist for transportation;Help with stairs or ramp for entrance;A little help with walking and/or transfers;A little help with bathing/dressing/bathroom   Can travel by private vehicle        Equipment Recommendations  Other (comment) (delivered)    Recommendations for Other Services       Precautions / Restrictions Precautions Precautions: Fall;Knee Restrictions LLE Weight Bearing Per Provider Order: Weight bearing as tolerated     Mobility  Bed Mobility Overal bed mobility: Needs Assistance Bed Mobility: Supine to Sit     Supine to sit: Min assist, HOB elevated, Used rails     General bed mobility comments: assist to progress LEs off bed    Transfers Overall transfer level: Needs assistance Equipment used: Rolling walker (2 wheels) Transfers: Sit to/from Stand Sit to Stand: Min assist, Mod assist           General transfer comment: min to mod assist for anterior superior wt shift/prevent posterior translation  bed and chair    Ambulation/Gait Ambulation/Gait assistance: Min assist Gait Distance (Feet): 45 Feet Assistive device: Rolling walker (2 wheels) Gait Pattern/deviations: Step-to pattern, Decreased stride  length, Decreased stance time - left, Decreased weight shift to left Gait velocity: decreased     General Gait Details: step-to gait pattern with decreased LLE stance time and weight-bearing, no knee buckling noted, lacking full knee extension bilaterally and RLE with increased external rotation, seated rest break due to fatigue; chair follow for safety as pt needing seated rest break   Stairs Stairs: Yes Stairs assistance: Min assist Stair Management: Step to pattern, Backwards, With walker Number of Stairs: 2 General stair comments: cues for seqeunce, technique. assist to balance, maneuver RW   Wheelchair Mobility     Tilt Bed    Modified Rankin (Stroke Patients Only)       Balance   Sitting-balance support: Feet supported Sitting balance-Leahy Scale: Good     Standing balance support: Reliant on assistive device for balance, During functional activity, Bilateral upper extremity supported Standing balance-Leahy Scale: Poor                              Cognition Arousal: Alert Behavior During Therapy: WFL for tasks assessed/performed Overall Cognitive Status: Within Functional Limits for tasks assessed                                          Exercises      General Comments        Pertinent Vitals/Pain Pain Assessment Pain Assessment: Faces Faces Pain Scale: Hurts even more Pain Location: L knee Pain Descriptors / Indicators: Grimacing, Guarding,  Sore Pain Intervention(s): Limited activity within patient's tolerance, Monitored during session, Premedicated before session, Repositioned    Home Living                          Prior Function            PT Goals (current goals can now be found in the care plan section) Acute Rehab PT Goals Patient Stated Goal: return home with cousin supporting, HHPT PT Goal Formulation: With patient/family Time For Goal Achievement: 02/19/23 Potential to Achieve Goals:  Good Progress towards PT goals: Progressing toward goals    Frequency    7X/week      PT Plan      Co-evaluation              AM-PAC PT "6 Clicks" Mobility   Outcome Measure  Help needed turning from your back to your side while in a flat bed without using bedrails?: A Little Help needed moving from lying on your back to sitting on the side of a flat bed without using bedrails?: A Little Help needed moving to and from a bed to a chair (including a wheelchair)?: A Little Help needed standing up from a chair using your arms (e.g., wheelchair or bedside chair)?: A Little Help needed to walk in hospital room?: A Little   6 Click Score: 15    End of Session Equipment Utilized During Treatment: Gait belt Activity Tolerance: Patient tolerated treatment well;Patient limited by fatigue Patient left: in chair;with call bell/phone within reach;with chair alarm set Nurse Communication: Mobility status PT Visit Diagnosis: Muscle weakness (generalized) (M62.81);Pain;Difficulty in walking, not elsewhere classified (R26.2) Pain - Right/Left: Left Pain - part of body: Knee     Time: 1610-9604 PT Time Calculation (min) (ACUTE ONLY): 34 min  Charges:    $Gait Training: 23-37 mins PT General Charges $$ ACUTE PT VISIT: 1 Visit                     Kimiah Hibner, PT  Acute Rehab Dept ([facility]) [phone]  02/06/2023    [facility] 02/06/2023, 3:03 PM

## 2023-02-06 NOTE — Discharge Summary (Addendum)
Patient ID: Tina Bryant MRN: [id number] DOB/AGE: [birth year] 78 y.o.  Admit date: 02/04/2023 Discharge date: 02/09/2023  Admission Diagnoses:  Principal Problem:   Primary osteoarthritis of left knee   Discharge Diagnoses:  Same  Past Medical History:  Diagnosis Date   Arthritis    Chronic kidney disease    CKD3   DVT (deep venous thrombosis) (HCC)    Hepatitis    b   Hypertension    Pneumonia    Pre-diabetes     Surgeries: Procedure(s): Left TOTAL KNEE ARTHROPLASTY, OPEN REDUCTION INTERNAL FIXATION OF MEDIAL FEMORAL CONDYLE on 02/04/2023   Consultants:   Discharged Condition: Improved  Hospital Course: Tina Bryant is an 78 y.o. female who was admitted 02/04/2023 for operative treatment ofPrimary osteoarthritis of left knee. Patient has severe unremitting pain that affects sleep, daily activities, and work/hobbies. After pre-op clearance the patient was taken to the operating room on 02/04/2023 and underwent  Procedure(s): Left TOTAL KNEE ARTHROPLASTY, OPEN REDUCTION INTERNAL FIXATION OF MEDIAL FEMORAL CONDYLE.    Patient was given perioperative antibiotics:  Anti-infectives (From admission, onward)    Start     Dose/Rate Route Frequency Ordered Stop   02/04/23 2300  ceFAZolin (ANCEF) IVPB 2g/100 mL premix        2 g 200 mL/hr over 30 Minutes Intravenous Every 6 hours 02/04/23 2155 02/05/23 0719   02/04/23 1245  ceFAZolin (ANCEF) IVPB 2g/100 mL premix        2 g 200 mL/hr over 30 Minutes Intravenous On call to O.R. 02/04/23 1238 02/04/23 1511        Patient was given sequential compression devices, early ambulation, and chemoprophylaxis to prevent DVT.  The patient made slow progress with physical therapy and had difficulty moving about independently and was kept in the hospital for additional therapy for safe discharge home.  Patient benefited maximally from hospital stay and there were no complications.    Recent vital signs: Patient Vitals for the past 24  hrs:  BP Temp Temp src Pulse Resp SpO2  02/06/23 0805 (!) 182/94 98.5 F (36.9 C) Oral 85 16 99 %  02/06/23 0617 (!) 153/78 98 F (36.7 C) -- 77 20 100 %  02/06/23 0220 (!) 154/70 98.2 F (36.8 C) -- 78 20 100 %  02/05/23 2129 (!) 153/81 98.4 F (36.9 C) -- 88 20 100 %     Recent laboratory studies: No results for input(s): "WBC", "HGB", "HCT", "PLT", "NA", "K", "CL", "CO2", "BUN", "CREATININE", "GLUCOSE", "INR", "CALCIUM" in the last 72 hours.  Invalid input(s): "PT", "2"   Discharge Medications:   Allergies as of 02/06/2023       Reactions   Tramadol    States it caused bruises on her body; she also stated it causes issues with her kidneys   Lisinopril Cough        Medication List     STOP taking these medications    FLAXSEED OIL PO       TAKE these medications    acetaminophen 500 MG tablet Commonly known as: TYLENOL Take 500 mg by mouth every 6 (six) hours as needed for mild pain (pain score 1-3) or moderate pain (pain score 4-6) (Take for back pain).   apixaban 2.5 MG Tabs tablet Commonly known as: Eliquis Take 1 tablet (2.5 mg total) by mouth 2 (two) times daily. What changed:  medication strength See the new instructions.   atorvastatin 40 MG tablet Commonly known as: LIPITOR Take 40 mg by mouth daily.  carboxymethylcellulose 0.5 % Soln Commonly known as: REFRESH PLUS Place 1-2 drops into both eyes See admin instructions. Instill 2 drops in left eye daily and 1 drop into the right eye daily   CENTRUM SILVER PO Take 1 tablet by mouth daily.   docusate sodium 100 MG capsule Commonly known as: Colace Take 1 capsule (100 mg total) by mouth 2 (two) times daily.   losartan 25 MG tablet Commonly known as: COZAAR Take 25 mg by mouth daily with breakfast.   oxyCODONE-acetaminophen 5-325 MG tablet Commonly known as: PERCOCET/ROXICET Take 1 tablet by mouth every 4 (four) hours as needed for severe pain (pain score 7-10).    spironolactone-hydrochlorothiazide 25-25 MG tablet Commonly known as: ALDACTAZIDE Take 1 tablet by mouth daily.   tiZANidine 2 MG tablet Commonly known as: ZANAFLEX Take 1 tablet (2 mg total) by mouth every 8 (eight) hours as needed for muscle spasms.   Vitamin D-1000 Max St 25 MCG (1000 UT) tablet Generic drug: Cholecalciferol Take 1,000 Units by mouth daily.               Durable Medical Equipment  (From admission, onward)           Start     Ordered   02/04/23 2156  DME Walker rolling  Once       Question:  Patient needs a walker to treat with the following condition  Answer:  Primary osteoarthritis of left knee   02/04/23 2155   02/04/23 2156  DME 3 n 1  Once        02/04/23 2155              Discharge Care Instructions  (From admission, onward)           Start     Ordered   02/06/23 0000  Weight bearing as tolerated       Question Answer Comment  Laterality left   Extremity Lower      02/06/23 1300            Diagnostic Studies: No results found.  Disposition: Discharge disposition: 01-Home or Self Care     The patient will start home health physical therapy in 3 to 5 days.  Discharge Instructions     Call MD / Call 911   Complete by: As directed    If you experience chest pain or shortness of breath, CALL 911 and be transported to the hospital emergency room.  If you develope a fever above 101 F, pus (white drainage) or increased drainage or redness at the wound, or calf pain, call your surgeon's office.   Constipation Prevention   Complete by: As directed    Drink plenty of fluids.  Prune juice may be helpful.  You may use a stool softener, such as Colace (over the counter) 100 mg twice a day.  Use MiraLax (over the counter) for constipation as needed.   Diet general   Complete by: As directed    Increase activity slowly as tolerated   Complete by: As directed    Post-operative opioid taper instructions:   Complete by: As  directed    POST-OPERATIVE OPIOID TAPER INSTRUCTIONS: It is important to wean off of your opioid medication as soon as possible. If you do not need pain medication after your surgery it is ok to stop day one. Opioids include: Codeine, Hydrocodone(Norco, Vicodin), Oxycodone(Percocet, oxycontin) and hydromorphone amongst others.  Long term and even short term use of opiods can cause: Increased  pain response Dependence Constipation Depression Respiratory depression And more.  Withdrawal symptoms can include Flu like symptoms Nausea, vomiting And more Techniques to manage these symptoms Hydrate well Eat regular healthy meals Stay active Use relaxation techniques(deep breathing, meditating, yoga) Do Not substitute Alcohol to help with tapering If you have been on opioids for less than two weeks and do not have pain than it is ok to stop all together.  Plan to wean off of opioids This plan should start within one week post op of your joint replacement. Maintain the same interval or time between taking each dose and first decrease the dose.  Cut the total daily intake of opioids by one tablet each day Next start to increase the time between doses. The last dose that should be eliminated is the evening dose.      Weight bearing as tolerated   Complete by: As directed    Laterality: left   Extremity: Lower        Follow-up Information     Jodi Geralds, MD. Schedule an appointment as soon as possible for a visit in 2 week(s).   Specialty: Orthopedic Surgery Contact information: [address] [place] [address] [phone]                  Signed: Matthew Folks 02/06/2023, 1:02 PM

## 2023-02-07 DIAGNOSIS — M1712 Unilateral primary osteoarthritis, left knee: Secondary | ICD-10-CM | POA: Diagnosis not present

## 2023-02-07 NOTE — Progress Notes (Signed)
PT TX NOTE  02/07/23 1300  PT Visit Information  Last PT Received On 02/07/23  Assistance Needed Pt requesting to sit d/t fatigue after standing, pt able to continue with encouragement and amb ~ 25' with RW  and min assist.   Pt is progressing slowly, not PT meeting goals for safe d/c. Continues to require incr assist for bed mobility and transfers; pt family report that her 80 yo cousin/caregiver cannot provide this level of assist.  Patient would benefit from continued  follow up therapy, <3 hours/day at d/c Continue PT in acute setting   History of Present Illness Jonnie Blacklock is a 78 y.o. female L TKA and ORIF medial femoral condyle 02/04/23. PMH: CKD, DVT, hepatitis B, HTN  Subjective Data  Patient Stated Goal return home with cousin supporting, HHPT  Precautions  Precautions Fall;Knee  Restrictions  LLE Weight Bearing Per Provider Order WBAT  Pain Assessment  Pain Assessment Faces  Faces Pain Scale 6  Pain Location L knee  Pain Descriptors / Indicators Grimacing;Guarding;Sore  Pain Intervention(s) Limited activity within patient's tolerance;Monitored during session;Premedicated before session;Repositioned  Cognition  Arousal Alert  Behavior During Therapy WFL for tasks assessed/performed  Overall Cognitive Status Within Functional Limits for tasks assessed  Transfers  Overall transfer level Needs assistance  Equipment used Rolling walker (2 wheels)  Transfers Sit to/from Stand  Sit to Stand Min assist  General transfer comment min to mod assist for anterior superior wt shift/prevent posterior translation; incr time needed  Ambulation/Gait  Ambulation/Gait assistance Min assist  Gait Distance (Feet) 25 Feet  Assistive device Rolling walker (2 wheels)  Gait Pattern/deviations Step-to pattern;Decreased stride length;Decreased stance time - left;Decreased weight shift to left  General Gait Details step-to gait pattern with decreased LLE stance time d/t pain, no knee buckling  noted,   seated rest break due to fatigue; chair follow for safety  Gait velocity decreased   Exercises--ankle pumps x 10 bil, quad sets bil x 5; AAROM knee flexion ~ 85 degrees  in sitting   Sitting-balance support Feet supported  Sitting balance-Leahy Scale Good  Standing balance support Reliant on assistive device for balance;During functional activity;Bilateral upper extremity supported  Standing balance-Leahy Scale Poor  PT - End of Session  Equipment Utilized During Treatment Gait belt  Activity Tolerance Patient tolerated treatment well;Patient limited by fatigue  Patient left with call bell/phone within reach  Nurse Communication Mobility status   PT - Assessment/Plan  PT Visit Diagnosis Muscle weakness (generalized) (M62.81);Pain;Difficulty in walking, not elsewhere classified (R26.2)  Pain - Right/Left Left  Pain - part of body Knee  PT Frequency (ACUTE ONLY) 7X/week  Follow Up Recommendations Follow physician's recommendations for discharge plan and follow up therapies  Patient can return home with the following Assistance with cooking/housework;Assist for transportation;Help with stairs or ramp for entrance;A little help with walking and/or transfers;A little help with bathing/dressing/bathroom  PT equipment Other (comment) (delivered- [facility])  AM-PAC PT "6 Clicks" Mobility Outcome Measure (Version 2)  Help needed turning from your back to your side while in a flat bed without using bedrails? 3  Help needed moving from lying on your back to sitting on the side of a flat bed without using bedrails? 3  Help needed moving to and from a bed to a chair (including a wheelchair)? 3  Help needed standing up from a chair using your arms (e.g., wheelchair or bedside chair)? 3  Help needed to walk in hospital room? 2  Help needed climbing 3-5 steps  with a railing?  2  6 Click Score 16  Consider Recommendation of Discharge To: Home with [facility]  PT Goal Progression  Progress towards PT goals  Progressing toward goals (slowly)  Acute Rehab PT Goals  PT Goal Formulation With patient/family  Time For Goal Achievement 02/19/23  Potential to Achieve Goals Good  PT Time Calculation  PT Start Time (ACUTE ONLY) 1151  PT Stop Time (ACUTE ONLY) 1206  PT Time Calculation (min) (ACUTE ONLY) 15 min  PT General Charges  $$ ACUTE PT VISIT 1 Visit  PT Treatments  $Gait Training 8-22 mins

## 2023-02-07 NOTE — Progress Notes (Signed)
Physical Therapy Treatment Patient Details Name: Tina Bryant MRN: [id number] DOB: 05-Nov-1945 Today's Date: 02/07/2023   History of Present Illness Tina Bryant is a 78 y.o. female L TKA and ORIF medial femoral condyle 02/04/23. PMH: CKD, DVT, hepatitis B, HTN    PT Comments  Pt progressing slowly toward goals.  Requires excessive time to complete basic functional tasks (10 minutes to come to sitting EOB), 6 minutes to stand and complete stand pivot bed to [facility]. Pt continues to require assist with bed mobility and transfers; post acute rehab remains appropriate.   Patient will benefit from continued inpatient follow up therapy, <3 hours/day    If plan is discharge home, recommend the following: Assistance with cooking/housework;Assist for transportation;Help with stairs or ramp for entrance;A little help with walking and/or transfers;A little help with bathing/dressing/bathroom   Can travel by private vehicle        Equipment Recommendations  Other (comment) (delivered- [facility])    Recommendations for Other Services       Precautions / Restrictions Precautions Precautions: Fall;Knee Restrictions Weight Bearing Restrictions Per Provider Order: Yes LLE Weight Bearing Per Provider Order: Weight bearing as tolerated     Mobility  Bed Mobility Overal bed mobility: Needs Assistance Bed Mobility: Supine to Sit     Supine to sit: Min assist, HOB elevated     General bed mobility comments: excessive time required, cues to self assist to progress bil LEs off bed, to scoot toward EOB; pt using gait belt to assist LLE, requires assist to completely move LEs off bed    Transfers Overall transfer level: Needs assistance Equipment used: Rolling walker (2 wheels) Transfers: Sit to/from Stand Sit to Stand: Min assist, From elevated surface   Step pivot transfers: Min assist, Mod assist       General transfer comment: min to mod assist for anterior superior wt shift/prevent  posterior translation; incr time needed and assist to steady for step pivot to [facility]    Ambulation/Gait                   Stairs             Wheelchair Mobility     Tilt Bed    Modified Rankin (Stroke Patients Only)       Balance   Sitting-balance support: Feet supported Sitting balance-Leahy Scale: Good     Standing balance support: Reliant on assistive device for balance, During functional activity, Bilateral upper extremity supported Standing balance-Leahy Scale: Poor                              Cognition Arousal: Alert Behavior During Therapy: WFL for tasks assessed/performed Overall Cognitive Status: Within Functional Limits for tasks assessed                                          Exercises      General Comments        Pertinent Vitals/Pain Pain Assessment Pain Assessment: Faces Faces Pain Scale: Hurts even more Pain Location: L knee Pain Descriptors / Indicators: Grimacing, Guarding, Sore Pain Intervention(s): Limited activity within patient's tolerance, Monitored during session, Premedicated before session, Repositioned    Home Living  Prior Function            PT Goals (current goals can now be found in the care plan section) Acute Rehab PT Goals Patient Stated Goal: return home with cousin supporting, HHPT PT Goal Formulation: With patient/family Time For Goal Achievement: 02/19/23 Potential to Achieve Goals: Good Progress towards PT goals: Progressing toward goals    Frequency    7X/week      PT Plan      Co-evaluation              AM-PAC PT "6 Clicks" Mobility   Outcome Measure  Help needed turning from your back to your side while in a flat bed without using bedrails?: A Little Help needed moving from lying on your back to sitting on the side of a flat bed without using bedrails?: A Little Help needed moving to and from a bed to a chair  (including a wheelchair)?: A Little Help needed standing up from a chair using your arms (e.g., wheelchair or bedside chair)?: A Little Help needed to walk in hospital room?: A Little Help needed climbing 3-5 steps with a railing? : A Lot 6 Click Score: 17    End of Session Equipment Utilized During Treatment: Gait belt Activity Tolerance: Patient tolerated treatment well;Patient limited by fatigue Patient left: with call bell/phone within reach;Other (comment) ([facility], left per pt request) Nurse Communication: Mobility status PT Visit Diagnosis: Muscle weakness (generalized) (M62.81);Pain;Difficulty in walking, not elsewhere classified (R26.2) Pain - Right/Left: Left Pain - part of body: Knee     Time: 1120-1136 PT Time Calculation (min) (ACUTE ONLY): 16 min  Charges:    $Therapeutic Activity: 8-22 mins PT General Charges $$ ACUTE PT VISIT: 1 Visit                     Javonne Louissaint, PT  Acute Rehab Dept [facility]) [phone]  02/07/2023    [facility] 02/07/2023, 11:46 AM

## 2023-02-07 NOTE — Plan of Care (Signed)
Problem: Pain Managment: Goal: General experience of comfort will improve and/or be controlled Outcome: Progressing

## 2023-02-07 NOTE — Progress Notes (Signed)
Subjective: 3 Days Post-Op Procedure(s) (LRB): TOTAL KNEE ARTHROPLASTY, OPEN REDUCTION INTERNAL FIXATION OF MEDIAL FEMORAL CONDYLE (Left) Patient reports pain as moderate.  She needed 1 additional day of physical therapy in order to ambulate safely.  She is doing better today.  Still complains of pain in her left knee.  She is taking by mouth and voiding okay.  Objective: Vital signs in last 24 hours: Temp:  [98 F (36.7 C)-99.2 F (37.3 C)] 98 F (36.7 C) (01/30 0830) Pulse Rate:  [88-106] 94 (01/30 0830) Resp:  [14-18] 16 (01/30 0830) BP: (130-144)/(67-85) 144/85 (01/30 0830) SpO2:  [98 %-100 %] 100 % (01/30 0830)  Intake/Output from previous day: 01/29 0701 - 01/30 0700 In: 920 [P.O.:920] Out: 400 [Urine:400] Intake/Output this shift: Total I/O In: 480 [P.O.:480] Out: -   No results for input(s): "HGB" in the last 72 hours. No results for input(s): "WBC", "RBC", "HCT", "PLT" in the last 72 hours. No results for input(s): "NA", "K", "CL", "CO2", "BUN", "CREATININE", "GLUCOSE", "CALCIUM" in the last 72 hours. No results for input(s): "LABPT", "INR" in the last 72 hours. Left knee exam: Neurovascular intact Sensation intact distally Intact pulses distally Dorsiflexion/Plantar flexion intact Incision: dressing C/D/I No cellulitis present Compartment soft   Assessment/Plan: 3 Days Post-Op Procedure(s) (LRB): TOTAL KNEE ARTHROPLASTY, OPEN REDUCTION INTERNAL FIXATION OF MEDIAL FEMORAL CONDYLE (Left) Plan: Discharge home with home health Follow-up with Dr. Luiz Blare in 10 days.   Anticipated LOS equal to or greater than 2 midnights due to - Age 80 and older with one or more of the following:  - Obesity  - Expected need for hospital services (PT, OT, Nursing) required for safe  discharge  - Anticipated need for postoperative skilled nursing care or inpatient rehab  -History of DVT. OR   - Unanticipated findings during/Post Surgery: Slow post-op progression: GI, pain  control, mobility  - Patient is a high risk of re-admission due to: Barriers to post-acute care (logistical, no family support in home)   Matthew Folks 02/07/2023, 1:10 PM

## 2023-02-08 DIAGNOSIS — M1712 Unilateral primary osteoarthritis, left knee: Secondary | ICD-10-CM | POA: Diagnosis not present

## 2023-02-08 NOTE — Progress Notes (Signed)
Physical Therapy Treatment Patient Details Name: Tina Bryant MRN: [id number] DOB: 12-05-1945 Today's Date: 02/08/2023   History of Present Illness Tina Bryant is a 78 y.o. female L TKA and ORIF medial femoral condyle 02/04/23. PMH: CKD, DVT, hepatitis B, HTN    PT Comments   Pt admitted with above diagnosis.  Pt currently with functional limitations due to the deficits listed below (see PT Problem List). PT returned for PM session and pt seated in recliner, pt reported 9.10 L knee pain, PT made nursing staff aware whom provided pain medication. Pt proceeded to call son per d/c planing, PT unable to provide specific information son was requesting and directed him to call social worker. PT was however able to inquire if pt would have more assist in home setting and son reported she would and in addition he is flying in on Saturday. PT able to communicate to pt and son pt progress with am session. Pt agreeable to therapy intervention, pt required increased time and cues for sit to stand  from recliner with use of armrests CGA, gait tasks with RW, CGA and cues for posture, proper distance from RW and safety, pt able to amb 60 antalgic pattern, flexed posture and slow cadence. D/c planning HH vs SNF at this time. Pt will benefit from acute skilled PT to increase their independence and safety with mobility to allow discharge.      If plan is discharge home, recommend the following: Assistance with cooking/housework;Assist for transportation;Help with stairs or ramp for entrance;A little help with walking and/or transfers;A little help with bathing/dressing/bathroom   Can travel by private vehicle        Equipment Recommendations  Other (comment) (delivered- [facility])    Recommendations for Other Services       Precautions / Restrictions Precautions Precautions: Fall;Knee Restrictions Weight Bearing Restrictions Per Provider Order: Yes LLE Weight Bearing Per Provider Order: Weight bearing as  tolerated     Mobility  Bed Mobility Overal bed mobility: Needs Assistance Bed Mobility: Supine to Sit     Supine to sit: Min assist, HOB elevated, Used rails     General bed mobility comments: pt seated in recliner when PT arrived and returned to recliner at end of therapy session    Transfers Overall transfer level: Needs assistance Equipment used: Rolling walker (2 wheels) Transfers: Sit to/from Stand Sit to Stand: Contact guard assist Stand pivot transfers: Contact guard assist         General transfer comment: cues and pt able to push to stand with B UE from recliner  increased time and cues,    Ambulation/Gait Ambulation/Gait assistance: Contact guard assist Gait Distance (Feet): 60 Feet Assistive device: Rolling walker (2 wheels) Gait Pattern/deviations: Step-to pattern, Decreased stride length, Decreased stance time - left, Decreased weight shift to left, Antalgic Gait velocity: decreased     General Gait Details: pt required min cues for posture proper distance from RW step to pattern with slow cadence   Stairs             Wheelchair Mobility     Tilt Bed    Modified Rankin (Stroke Patients Only)       Balance Overall balance assessment: Needs assistance Sitting-balance support: Feet supported Sitting balance-Leahy Scale: Good     Standing balance support: Reliant on assistive device for balance, During functional activity, Bilateral upper extremity supported Standing balance-Leahy Scale: Fair Standing balance comment: static standing no UE support  Cognition Arousal: Alert Behavior During Therapy: WFL for tasks assessed/performed Overall Cognitive Status: Within Functional Limits for tasks assessed                                          Exercises      General Comments        Pertinent Vitals/Pain Pain Assessment Pain Assessment: 0-10 Pain Score: 9  Faces Pain Scale:  Hurts little more Pain Location: L knee Pain Descriptors / Indicators: Grimacing, Guarding, Sore, Discomfort, Constant Pain Intervention(s): Limited activity within patient's tolerance, Monitored during session, Premedicated before session, Repositioned, Patient requesting pain meds-RN notified, RN gave pain meds during session, Ice applied    Home Living                          Prior Function            PT Goals (current goals can now be found in the care plan section) Acute Rehab PT Goals Patient Stated Goal: return home with cousin supporting, HHPT PT Goal Formulation: With patient/family Time For Goal Achievement: 02/19/23 Potential to Achieve Goals: Good Progress towards PT goals: Progressing toward goals    Frequency    7X/week      PT Plan      Co-evaluation              AM-PAC PT "6 Clicks" Mobility   Outcome Measure  Help needed turning from your back to your side while in a flat bed without using bedrails?: A Little Help needed moving from lying on your back to sitting on the side of a flat bed without using bedrails?: A Little Help needed moving to and from a bed to a chair (including a wheelchair)?: A Little Help needed standing up from a chair using your arms (e.g., wheelchair or bedside chair)?: A Little Help needed to walk in hospital room?: A Little Help needed climbing 3-5 steps with a railing? : A Lot 6 Click Score: 17    End of Session Equipment Utilized During Treatment: Gait belt Activity Tolerance: Patient tolerated treatment well Patient left: with call bell/phone within reach;in chair;with chair alarm set Nurse Communication: Mobility status PT Visit Diagnosis: Muscle weakness (generalized) (M62.81);Pain;Difficulty in walking, not elsewhere classified (R26.2) Pain - Right/Left: Left Pain - part of body: Knee     Time: 7253-6644 PT Time Calculation (min) (ACUTE ONLY): 24 min  Charges:    $Gait Training: 8-22  mins $Therapeutic Activity: 8-22 mins PT General Charges $$ ACUTE PT VISIT: 1 Visit                     Johnny Bridge, PT Acute Rehab    Tina Bryant 02/08/2023, 2:03 PM

## 2023-02-08 NOTE — TOC Progression Note (Signed)
Transition of Care [facility]) - Progression Note    Patient Details  Name: Tina Bryant MRN: [id number] Date of Birth: [birth year]  [facility]) CM/SW Contact  Amada Jupiter, LCSW Phone Number: 02/08/2023, 8:52 AM  Clinical Narrative:     Spoke yesterday with pt and family about discharge needs.  As noted in prior TOC note, pt living with an 80 yr old cousin who family reports cannot provide assistance to pt herself.  Son and daughter very concerned about pt returning home at this current functional level.  MD/PA aware and have begun SNF bed search with plan that PT will continue to follow and hope that pt will continue making gains and might return home.  Pt will need to be at a supervision level to return.  Have stressed to family that we need them to, also, see if there are any other family members or friends who might be able to stay with pt on a temp basis to avoid need for SNF.        Expected Discharge Plan and Services         Expected Discharge Date: 02/07/23                                     Social Determinants of Health (SDOH) Interventions SDOH Screenings   Food Insecurity: No Food Insecurity (02/05/2023)  Housing: Low Risk  (02/05/2023)  Transportation Needs: No Transportation Needs (02/05/2023)  Utilities: Not At Risk (02/05/2023)  Social Connections: Patient Declined (02/05/2023)  Tobacco Use: Unknown (02/04/2023)    Readmission Risk Interventions    11/06/2022   11:07 AM  Readmission Risk Prevention Plan  Post Dischage Appt Complete  Medication Screening Complete  Transportation Screening Complete

## 2023-02-08 NOTE — Plan of Care (Signed)
   Problem: Pain Managment: Goal: General experience of comfort will improve and/or be controlled Outcome: Progressing

## 2023-02-08 NOTE — Progress Notes (Signed)
Subjective: 4 Days Post-Op Procedure(s) (LRB): TOTAL KNEE ARTHROPLASTY, OPEN REDUCTION INTERNAL FIXATION OF MEDIAL FEMORAL CONDYLE (Left) Patient reports pain as mild.  The patient ambulated 60 feet with physical therapy today.  She is mobilizing much better.  She is taking by mouth and voiding okay.  I spoke with her son on the telephone who stated that a another family member was coming into town tomorrow and can take her home and stay with her initially.  He 100% agreed that going home tomorrow was appropriate.  The patient was kept in the hospital because of slow progress with mobility and ambulation with physical therapy.  Disposition for this patient has been difficult.  Objective: Vital signs in last 24 hours: Temp:  [98 F (36.7 C)-98.3 F (36.8 C)] 98.1 F (36.7 C) (01/31 1326) Pulse Rate:  [92-99] 99 (01/31 1326) Resp:  [16-18] 18 (01/31 1326) BP: (138-151)/(74-122) 145/122 (01/31 1326) SpO2:  [100 %] 100 % (01/31 1326)  Intake/Output from previous day: 01/30 0701 - 01/31 0700 In: 1580 [P.O.:1580] Out: 250 [Urine:250] Intake/Output this shift: Total I/O In: 240 [P.O.:240] Out: 200 [Urine:200]  No results for input(s): "HGB" in the last 72 hours. No results for input(s): "WBC", "RBC", "HCT", "PLT" in the last 72 hours. No results for input(s): "NA", "K", "CL", "CO2", "BUN", "CREATININE", "GLUCOSE", "CALCIUM" in the last 72 hours. No results for input(s): "LABPT", "INR" in the last 72 hours. Left knee exam Neurovascular intact Sensation intact distally Intact pulses distally Dorsiflexion/Plantar flexion intact Incision: dressing C/D/I No cellulitis present Compartment soft   Assessment/Plan: 4 Days Post-Op Procedure(s) (LRB): TOTAL KNEE ARTHROPLASTY, OPEN REDUCTION INTERNAL FIXATION OF MEDIAL FEMORAL CONDYLE (Left) Plan: After significant discussion with the patient's son and the patient herself we all agree that discharge home tomorrow was appropriate.  Apparently  home health physical therapy has been set up.  Her prescriptions have all been sent into her pharmacy earlier in the week. She will use Eliquis 2.5 mg twice daily for DVT prophylaxis. She will follow-up with Dr. Luiz Blare in 10 days.   Anticipated LOS equal to or greater than 2 midnights due to - Age 80 and older with one or more of the following:  - Obesity  - Expected need for hospital services (PT, OT, Nursing) required for safe  discharge  - Anticipated need for postoperative skilled nursing care or inpatient rehab  - Active co-morbidities: None OR   - Unanticipated findings during/Post Surgery: Slow post-op progression: GI, pain control, mobility  - Patient is a high risk of re-admission due to: Barriers to post-acute care (logistical, no family support in home)   Matthew Folks 02/08/2023, 4:08 PM

## 2023-02-08 NOTE — Progress Notes (Signed)
Physical Therapy Treatment Patient Details Name: Tina Bryant MRN: [id number] DOB: 08-19-1945 Today's Date: 02/08/2023   History of Present Illness Tina Bryant is a 78 y.o. female L TKA and ORIF medial femoral condyle 02/04/23. PMH: CKD, DVT, hepatitis B, HTN    PT Comments   Pt admitted with above diagnosis and is currently POD 4.  Pt currently with functional limitations due to the deficits listed below (see PT Problem List). Pt in bed when PT arrived. Pt agreeable to transitioning from bed to recliner for breakfast. Pt required increased time and min A for supine to sit with use of hospital bed. Pt required CGA for sit to stand  and to complete SPT at RW to recliner, pt demonstrated good recall for proper UE placement and improved RW management. Pt is slowly progressing with therapy intervention. Pt indicates will have increased family support in home setting and d/c planning home with family assist vs SNF for short term rehab. PT anticipates returning later in the day to assess safety, IND, stability and tolerance with gait tasks. Pt will benefit from acute skilled PT to increase their independence and safety with mobility to allow discharge.      If plan is discharge home, recommend the following: Assistance with cooking/housework;Assist for transportation;Help with stairs or ramp for entrance;A little help with walking and/or transfers;A little help with bathing/dressing/bathroom   Can travel by private vehicle        Equipment Recommendations  Other (comment) (delivered- [facility])    Recommendations for Other Services       Precautions / Restrictions Precautions Precautions: Fall;Knee Restrictions Weight Bearing Restrictions Per Provider Order: Yes LLE Weight Bearing Per Provider Order: Weight bearing as tolerated     Mobility  Bed Mobility Overal bed mobility: Needs Assistance Bed Mobility: Supine to Sit     Supine to sit: Min assist, HOB elevated, Used rails      General bed mobility comments: increased time, A for L LE to EOB and cues    Transfers Overall transfer level: Needs assistance Equipment used: Rolling walker (2 wheels) Transfers: Sit to/from Stand, Bed to chair/wheelchair/BSC Sit to Stand: Contact guard assist Stand pivot transfers: Contact guard assist         General transfer comment: cues and pt able to push to stand with one UE and other on RW, increased time and cues, pt demonstrated improved abiltiy to weight shift and manage RW    Ambulation/Gait               General Gait Details: pt agreeable to getting OOB to sit in recliner for breakfast this am   Stairs             Wheelchair Mobility     Tilt Bed    Modified Rankin (Stroke Patients Only)       Balance Overall balance assessment: Needs assistance Sitting-balance support: Feet supported Sitting balance-Leahy Scale: Good     Standing balance support: Reliant on assistive device for balance, During functional activity, Bilateral upper extremity supported Standing balance-Leahy Scale: Poor                              Cognition Arousal: Alert Behavior During Therapy: WFL for tasks assessed/performed Overall Cognitive Status: Within Functional Limits for tasks assessed  Exercises      General Comments        Pertinent Vitals/Pain Pain Assessment Pain Assessment: Faces Faces Pain Scale: Hurts little more Pain Location: L knee Pain Descriptors / Indicators: Grimacing, Guarding, Sore Pain Intervention(s): Limited activity within patient's tolerance, Monitored during session, Premedicated before session, Repositioned, Ice applied    Home Living                          Prior Function            PT Goals (current goals can now be found in the care plan section) Acute Rehab PT Goals Patient Stated Goal: return home with cousin supporting, HHPT PT  Goal Formulation: With patient/family Time For Goal Achievement: 02/19/23 Potential to Achieve Goals: Good Progress towards PT goals: Progressing toward goals (slow)    Frequency    7X/week      PT Plan      Co-evaluation              AM-PAC PT "6 Clicks" Mobility   Outcome Measure  Help needed turning from your back to your side while in a flat bed without using bedrails?: A Little Help needed moving from lying on your back to sitting on the side of a flat bed without using bedrails?: A Little Help needed moving to and from a bed to a chair (including a wheelchair)?: A Little Help needed standing up from a chair using your arms (e.g., wheelchair or bedside chair)?: A Little Help needed to walk in hospital room?: A Lot Help needed climbing 3-5 steps with a railing? : A Lot 6 Click Score: 16    End of Session Equipment Utilized During Treatment: Gait belt Activity Tolerance: Patient limited by fatigue Patient left: with call bell/phone within reach;in chair;with chair alarm set Nurse Communication: Mobility status;Patient requests pain meds PT Visit Diagnosis: Muscle weakness (generalized) (M62.81);Pain;Difficulty in walking, not elsewhere classified (R26.2) Pain - Right/Left: Left Pain - part of body: Knee     Time: 1610-9604 PT Time Calculation (min) (ACUTE ONLY): 20 min  Charges:    $Therapeutic Activity: 8-22 mins PT General Charges $$ ACUTE PT VISIT: 1 Visit                     Johnny Bridge, PT Acute Rehab    Tina Bryant 02/08/2023, 11:34 AM

## 2023-02-08 NOTE — NC FL2 (Signed)
[place] MEDICAID FL2 LEVEL OF CARE FORM     IDENTIFICATION  Patient Name: Tina Bryant Birthdate: 08/17/1945 Sex: female Admission Date (Current Location): 02/04/2023  [place] and [place] Number:  Haynes Bast [id number] Facility and Address:  [facility],  [address] [place], [place] [address]      Provider Number: [id number]  Attending Physician Name and Address:  Jodi Geralds, MD  Relative Name and Phone Number:  son, [place] [phone]    Current Level of Care: Hospital Recommended Level of Care: Skilled Nursing Facility Prior Approval Number:    Date Approved/Denied:   PASRR Number: [id number]  Discharge Plan: SNF    Current Diagnoses: Patient Active Problem List   Diagnosis Date Noted   Primary osteoarthritis of left knee 02/03/2023   DVT (deep venous thrombosis) (HCC) 11/01/2022   Fall at home, initial encounter 11/01/2022   CAP (community acquired pneumonia) 11/01/2022   Severe sepsis (HCC) 11/01/2022   AKI (acute kidney injury) (HCC) 11/01/2022   Acute metabolic encephalopathy 11/01/2022   CKD (chronic kidney disease) stage 3, GFR 30-59 ml/min (HCC) 11/01/2022   Elevated troponin 11/01/2022   Non-insulin treated type 2 diabetes mellitus (HCC) 11/01/2022   High anion gap metabolic acidosis 11/01/2022   Acute deep vein thrombosis (DVT) of femoral vein of left lower extremity (HCC) 06/08/2022   Hyperlipidemia 07/09/2006   Microcytic anemia 07/09/2006   DEPRESSION 07/09/2006   Essential hypertension 07/09/2006    Orientation RESPIRATION BLADDER Height & Weight     Self, Time, Situation, Place  Normal Continent Weight: 187 lb 6.3 oz (85 kg) Height:  5\' 6"  (167.6 cm)  BEHAVIORAL SYMPTOMS/MOOD NEUROLOGICAL BOWEL NUTRITION STATUS      Continent Diet (Regular)  AMBULATORY STATUS COMMUNICATION OF NEEDS Skin   Limited Assist   Normal                       Personal Care Assistance Level of Assistance  Bathing, Dressing  Bathing Assistance: Limited assistance   Dressing Assistance: Limited assistance     Functional Limitations Info  Sight, Hearing, Speech Sight Info: Adequate Hearing Info: Adequate Speech Info: Adequate    SPECIAL CARE FACTORS FREQUENCY  PT (By licensed PT), OT (By licensed OT)     PT Frequency: 5x/wk OT Frequency: 5x/wk            Contractures Contractures Info: Not present    Additional Factors Info  Code Status, Allergies Code Status Info: Full Allergies Info: Tramadol, Lisinopril           Current Medications (02/08/2023):  This is the current hospital active medication list Current Facility-Administered Medications  Medication Dose Route Frequency Provider Last Rate Last Admin   acetaminophen (TYLENOL) tablet 325-650 mg  325-650 mg Oral Q6H PRN Marshia Ly, PA-C   650 mg at 02/07/23 2126   alum & mag hydroxide-simeth (MAALOX/MYLANTA) 200-200-20 MG/5ML suspension 30 mL  30 mL Oral Q4H PRN Marshia Ly, PA-C       apixaban Everlene Balls) tablet 2.5 mg  2.5 mg Oral Q12H Marshia Ly, PA-C   2.5 mg at 02/07/23 2127   atorvastatin (LIPITOR) tablet 40 mg  40 mg Oral Daily Marshia Ly, PA-C   40 mg at 02/07/23 3244   bisacodyl (DULCOLAX) EC tablet 5 mg  5 mg Oral Daily PRN Marshia Ly, PA-C       diphenhydrAMINE (BENADRYL) 12.5 MG/5ML elixir 12.5-25 mg  12.5-25 mg Oral Q4H PRN Marshia Ly, PA-C  docusate sodium (COLACE) capsule 100 mg  100 mg Oral BID Marshia Ly, PA-C   100 mg at 02/07/23 1610   spironolactone (ALDACTONE) tablet 25 mg  25 mg Oral Daily Jodi Geralds, MD   25 mg at 02/07/23 9604   And   hydrochlorothiazide (HYDRODIURIL) tablet 25 mg  25 mg Oral Daily Jodi Geralds, MD   25 mg at 02/07/23 5409   HYDROcodone-acetaminophen (NORCO/VICODIN) 5-325 MG per tablet 1-2 tablet  1-2 tablet Oral Q4H PRN Marshia Ly, PA-C   1 tablet at 02/07/23 1228   losartan (COZAAR) tablet 25 mg  25 mg Oral Q breakfast Marshia Ly, PA-C   25 mg at 02/07/23 8119    magnesium citrate solution 1 Bottle  1 Bottle Oral Once PRN Marshia Ly, PA-C       methocarbamol (ROBAXIN) tablet 500 mg  500 mg Oral Q6H PRN Marshia Ly, PA-C   500 mg at 02/07/23 0126   Or   methocarbamol (ROBAXIN) injection 500 mg  500 mg Intravenous Q6H PRN Marshia Ly, PA-C       morphine (PF) 2 MG/ML injection 0.5-1 mg  0.5-1 mg Intravenous Q2H PRN Marshia Ly, PA-C       ondansetron [facility]) tablet 4 mg  4 mg Oral Q6H PRN Marshia Ly, PA-C       Or   ondansetron [facility]) injection 4 mg  4 mg Intravenous Q6H PRN Marshia Ly, PA-C       polyethylene glycol (MIRALAX / GLYCOLAX) packet 17 g  17 g Oral Daily PRN Marshia Ly, PA-C       polyvinyl alcohol (LIQUIFILM TEARS) 1.4 % ophthalmic solution 1 drop  1 drop Right Eye Daily Jodi Geralds, MD   1 drop at 02/07/23 1478   polyvinyl alcohol (LIQUIFILM TEARS) 1.4 % ophthalmic solution 2 drop  2 drop Left Eye Daily Jodi Geralds, MD   2 drop at 02/07/23 2956     Discharge Medications: Please see discharge summary for a list of discharge medications.  Relevant Imaging Results:  Relevant Lab Results:   Additional Information SSN: [id number]  Amada Jupiter, LCSW

## 2023-02-08 NOTE — TOC Transition Note (Signed)
Transition of Care [facility]) - Discharge Note   Patient Details  Name: Tina Bryant MRN: [id number] Date of Birth: 08-24-1945  Transition of Care [facility]) CM/SW Contact:  Amada Jupiter, LCSW Phone Number: 02/08/2023, 4:30 PM   Clinical Narrative:    Left VM for pt's son today but have spoken with Gus Puma, PA and PT who note pt has continued to make good progress with mobility and feel that a home discharge is now reasonable.  PA has spoken with pt and son and they are agreed with plan to dc tomorrow.  HHPT has been set up already with [facility].  Bedside commode delivered to room via [employer] earlier in the week and have placed order for RW to be delivered prior to dc tomorrow.  No further TOC needs.   Final next level of care: Home w Home Health Services Barriers to Discharge: Barriers Resolved   Patient Goals and CMS Choice Patient states their goals for this hospitalization and ongoing recovery are:: go home          Discharge Placement                       Discharge Plan and Services Additional resources added to the After Visit Summary for                  DME Arranged: 3-N-1, Walker rolling DME Agency: [employer] Date DME Agency Contacted: 02/08/23     HH Arranged: PT HH Agency: [facility] Date [facility] Agency Contacted: 02/06/23   Representative spoke with at [facility] Agency: Kandee Keen  Social Drivers of Health (SDOH) Interventions SDOH Screenings   Food Insecurity: No Food Insecurity (02/05/2023)  Housing: Low Risk  (02/05/2023)  Transportation Needs: No Transportation Needs (02/05/2023)  Utilities: Not At Risk (02/05/2023)  Social Connections: Patient Declined (02/05/2023)  Tobacco Use: Unknown (02/04/2023)     Readmission Risk Interventions    11/06/2022   11:07 AM  Readmission Risk Prevention Plan  Post Dischage Appt Complete  Medication Screening Complete  Transportation Screening Complete

## 2023-02-09 DIAGNOSIS — M1712 Unilateral primary osteoarthritis, left knee: Secondary | ICD-10-CM | POA: Diagnosis not present

## 2023-02-09 NOTE — Progress Notes (Signed)
Physical Therapy Treatment Patient Details Name: Tina Bryant MRN: [id number] DOB: [birth year] Today's Date: 02/09/2023   History of Present Illness Tina Bryant is a 78 y.o. female L TKA and ORIF medial femoral condyle 02/04/23. PMH: CKD, DVT, hepatitis B, HTN    PT Comments  Pt progressing well this session. Meeting goals and feels ready to d/c home  with family assist. Pt overall supervision level. Much improved from last sessions.   If plan is discharge home, recommend the following: Assistance with cooking/housework;Assist for transportation;Help with stairs or ramp for entrance;A little help with walking and/or transfers;A little help with bathing/dressing/bathroom   Can travel by private vehicle        Equipment Recommendations       Recommendations for Other Services       Precautions / Restrictions Precautions Precautions: Fall;Knee Restrictions Weight Bearing Restrictions Per Provider Order: Yes LLE Weight Bearing Per Provider Order: Weight bearing as tolerated     Mobility  Bed Mobility   Bed Mobility: Supine to Sit, Sit to Supine     Supine to sit: Supervision Sit to supine: Supervision   General bed mobility comments: for safety, no physical assist, able to use gait belt to self assist    Transfers Overall transfer level: Needs assistance Equipment used: Rolling walker (2 wheels) Transfers: Sit to/from Stand Sit to Stand: Supervision, Contact guard assist           General transfer comment: ongoing cues adn education on proper hand placement and LLE position    Ambulation/Gait Ambulation/Gait assistance: Contact guard assist, Supervision Gait Distance (Feet): 140 Feet Assistive device: Rolling walker (2 wheels) Gait Pattern/deviations: Step-through pattern, Decreased stance time - left, Knee flexed in stance - right, Knee flexed in stance - left       General Gait Details: progression to step through pattern with improved wt shift to LLE,  no LOB   Stairs             Wheelchair Mobility     Tilt Bed    Modified Rankin (Stroke Patients Only)       Balance     Sitting balance-Leahy Scale: Good     Standing balance support: Reliant on assistive device for balance, During functional activity, Bilateral upper extremity supported Standing balance-Leahy Scale: Fair Standing balance comment: static standing no UE support                            Cognition Arousal: Alert Behavior During Therapy: WFL for tasks assessed/performed Overall Cognitive Status: Within Functional Limits for tasks assessed                                          Exercises  SLR L x 10 AAROM Heelslides x 5 AAROM    General Comments        Pertinent Vitals/Pain Pain Assessment Pain Assessment: 0-10 Pain Score: 6  Pain Location: L knee Pain Descriptors / Indicators: Grimacing, Guarding, Sore, Discomfort, Constant Pain Intervention(s): Limited activity within patient's tolerance, Monitored during session, Premedicated before session, Repositioned    Home Living                          Prior Function            PT Goals (current goals can now  be found in the care plan section) Acute Rehab PT Goals Patient Stated Goal: return home with cousin supporting, HHPT PT Goal Formulation: With patient/family Time For Goal Achievement: 02/19/23 Potential to Achieve Goals: Good Progress towards PT goals: Progressing toward goals    Frequency    7X/week      PT Plan      Co-evaluation              AM-PAC PT "6 Clicks" Mobility   Outcome Measure  Help needed turning from your back to your side while in a flat bed without using bedrails?: None Help needed moving from lying on your back to sitting on the side of a flat bed without using bedrails?: None Help needed moving to and from a bed to a chair (including a wheelchair)?: None Help needed standing up from a chair using  your arms (e.g., wheelchair or bedside chair)?: None Help needed to walk in hospital room?: A Little Help needed climbing 3-5 steps with a railing? : A Little 6 Click Score: 22    End of Session Equipment Utilized During Treatment: Gait belt Activity Tolerance: Patient tolerated treatment well Patient left: in bed;with call bell/phone within reach;with bed alarm set   PT Visit Diagnosis: Muscle weakness (generalized) (M62.81);Pain;Difficulty in walking, not elsewhere classified (R26.2) Pain - Right/Left: Left Pain - part of body: Knee     Time: 1610-9604 PT Time Calculation (min) (ACUTE ONLY): 23 min  Charges:    $Gait Training: 23-37 mins PT General Charges $$ ACUTE PT VISIT: 1 Visit                     Clayson Riling, PT  Acute Rehab Dept [facility]) [phone]  02/09/2023    [facility] 02/09/2023, 11:58 AM

## 2023-02-09 NOTE — TOC Progression Note (Addendum)
Transition of Care [facility]) - Progression Note    Patient Details  Name: Tina Bryant MRN: [id number] Date of Birth: 29-Oct-1945  Transition of Care [facility]) CM/SW Contact  Adrian Prows, RN Phone Number: 02/09/2023, 11:00 AM  Clinical Narrative:    Sherron Monday w/ pt in room; she says RW has not been delivered; notified Jermaine at [facility]; he says RW will be delivered to pt's room; pt notified; Cory at [facility] notified d/c orders received.     Barriers to Discharge: Barriers Resolved  Expected Discharge Plan and Services         Expected Discharge Date: 02/09/23               DME Arranged: 3-N-1, Walker rolling DME Agency: [employer] Date DME Agency Contacted: 02/08/23     HH Arranged: PT HH Agency: [facility] Date [facility] Agency Contacted: 02/06/23   Representative spoke with at [facility] Agency: Kandee Keen   Social Determinants of Health (SDOH) Interventions SDOH Screenings   Food Insecurity: No Food Insecurity (02/05/2023)  Housing: Low Risk  (02/05/2023)  Transportation Needs: No Transportation Needs (02/05/2023)  Utilities: Not At Risk (02/05/2023)  Social Connections: Patient Declined (02/05/2023)  Tobacco Use: Unknown (02/04/2023)    Readmission Risk Interventions    11/06/2022   11:07 AM  Readmission Risk Prevention Plan  Post Dischage Appt Complete  Medication Screening Complete  Transportation Screening Complete

## 2023-02-09 NOTE — Progress Notes (Signed)
Discharge instructions discussed with patient, as well as daughter over the phone, verbalized agreement and understanding

## 2023-02-13 ENCOUNTER — Inpatient Hospital Stay: Payer: Medicaid Other | Attending: Internal Medicine

## 2023-02-13 DIAGNOSIS — Z7901 Long term (current) use of anticoagulants: Secondary | ICD-10-CM | POA: Insufficient documentation

## 2023-02-13 DIAGNOSIS — I82402 Acute embolism and thrombosis of unspecified deep veins of left lower extremity: Secondary | ICD-10-CM | POA: Insufficient documentation

## 2023-02-20 ENCOUNTER — Inpatient Hospital Stay (HOSPITAL_BASED_OUTPATIENT_CLINIC_OR_DEPARTMENT_OTHER): Payer: Medicaid Other | Admitting: Internal Medicine

## 2023-02-20 VITALS — BP 118/61 | HR 110 | Temp 97.7°F | Resp 18 | Ht 66.0 in | Wt 190.6 lb

## 2023-02-20 DIAGNOSIS — I82402 Acute embolism and thrombosis of unspecified deep veins of left lower extremity: Secondary | ICD-10-CM | POA: Diagnosis not present

## 2023-02-20 DIAGNOSIS — Z7901 Long term (current) use of anticoagulants: Secondary | ICD-10-CM | POA: Diagnosis not present

## 2023-02-20 NOTE — Progress Notes (Signed)
[facility] [phone]   [phone]  OFFICE PROGRESS NOTE  Felix Pacini, FNP [address] [place] [address]  DIAGNOSIS: Left lower extremity deep venous thrombosis of unprovoked etiology except for her sedentary life secondary to old age and decreased immobility. The patient has no previous personal or family history of deep venous thrombosis   PRIOR THERAPY: None  CURRENT THERAPY: Eliquis 5 mg p.o. twice daily.  She completed 6 months of treatment and currently on Eliquis 2.5 mg p.o. twice daily after left knee replacement.  INTERVAL HISTORY: Tina Bryant 78 y.o. female returns to the clinic today for follow-up visit. Discussed the use of AI scribe software for clinical note transcription with the patient, who gave verbal consent to proceed.  History of Present Illness   Tina Bryant is a 78 year old female who presents for follow-up after knee replacement surgery.  She has a history of a blood clot in the left leg, previously treated with Eliquis. Eliquis was discontinued before her knee replacement surgery and resumed post-operatively. She is currently on a lower dose of 2.5 mg twice daily for approximately six weeks to prevent further clots. There is no indication of a new blood clot since the surgery.  She underwent knee replacement surgery on January 27th and is currently in the postoperative recovery phase. She continues to take Eliquis to prevent thromboembolic events due to increased risk post-surgery.  No current symptoms of chest pain, breathing difficulties, nausea, or vomiting. She feels 'all right' otherwise.      MEDICAL HISTORY: Past Medical History:  Diagnosis Date   Arthritis    Chronic kidney disease    CKD3   DVT (deep venous thrombosis) (HCC)    Hepatitis    b   Hypertension    Pneumonia    Pre-diabetes     ALLERGIES:  is allergic to tramadol and lisinopril.  MEDICATIONS:  Current Outpatient  Medications  Medication Sig Dispense Refill   acetaminophen (TYLENOL) 500 MG tablet Take 500 mg by mouth every 6 (six) hours as needed for mild pain (pain score 1-3) or moderate pain (pain score 4-6) (Take for back pain).     apixaban (ELIQUIS) 2.5 MG TABS tablet Take 1 tablet (2.5 mg total) by mouth 2 (two) times daily. 42 tablet 0   atorvastatin (LIPITOR) 40 MG tablet Take 40 mg by mouth daily.     carboxymethylcellulose (REFRESH PLUS) 0.5 % SOLN Place 1-2 drops into both eyes See admin instructions. Instill 2 drops in left eye daily and 1 drop into the right eye daily     Cholecalciferol (VITAMIN D-1000 MAX ST) 25 MCG (1000 UT) tablet Take 1,000 Units by mouth daily.     docusate sodium (COLACE) 100 MG capsule Take 1 capsule (100 mg total) by mouth 2 (two) times daily. 30 capsule 0   losartan (COZAAR) 25 MG tablet Take 25 mg by mouth daily with breakfast.     Multiple Vitamins-Minerals (CENTRUM SILVER PO) Take 1 tablet by mouth daily.     oxyCODONE-acetaminophen (PERCOCET/ROXICET) 5-325 MG tablet Take 1 tablet by mouth every 4 (four) hours as needed for severe pain (pain score 7-10).     spironolactone-hydrochlorothiazide (ALDACTAZIDE) 25-25 MG tablet Take 1 tablet by mouth daily.     tiZANidine (ZANAFLEX) 2 MG tablet Take 1 tablet (2 mg total) by mouth every 8 (eight) hours as needed for muscle spasms. 40 tablet 0   No current facility-administered medications for  this visit.    SURGICAL HISTORY:  Past Surgical History:  Procedure Laterality Date   EYE SURGERY Left    strabismus surgery   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   Procedure: TOTAL KNEE ARTHROPLASTY, OPEN REDUCTION INTERNAL FIXATION OF MEDIAL FEMORAL CONDYLE;  Surgeon: Jodi Geralds, MD;  Location: [facility];  Service: Orthopedics;  Laterality: Left;    REVIEW OF SYSTEMS:  A comprehensive review of systems was negative except for: Constitutional: positive for fatigue Musculoskeletal: positive for arthralgias and muscle  weakness   PHYSICAL EXAMINATION: General appearance: alert, cooperative, and no distress Head: Normocephalic, without obvious abnormality, atraumatic Neck: no adenopathy, no JVD, supple, symmetrical, trachea midline, and thyroid not enlarged, symmetric, no tenderness/mass/nodules Lymph nodes: Cervical, supraclavicular, and axillary nodes normal. Resp: clear to auscultation bilaterally Back: symmetric, no curvature. ROM normal. No CVA tenderness. Cardio: regular rate and rhythm, S1, S2 normal, no murmur, click, rub or gallop GI: soft, non-tender; bowel sounds normal; no masses,  no organomegaly Extremities: extremities normal, atraumatic, no cyanosis or edema  ECOG PERFORMANCE STATUS: 1 - Symptomatic but completely ambulatory  Blood pressure 118/61, pulse (!) 110, temperature 97.7 F (36.5 C), temperature source Tympanic, resp. rate 18, height 5\' 6"  (1.676 m), weight 190 lb 9.6 oz (86.5 kg), SpO2 (!) 85%.  LABORATORY DATA: Lab Results  Component Value Date   WBC 6.4 01/31/2023   HGB 10.4 (L) 01/31/2023   HCT 34.8 (L) 01/31/2023   MCV 72.3 (L) 01/31/2023   PLT 175 01/31/2023      Chemistry      Component Value Date/Time   NA 137 01/31/2023 1322   K 5.3 (H) 01/31/2023 1322   CL 105 01/31/2023 1322   CO2 22 01/31/2023 1322   BUN 26 (H) 01/31/2023 1322   CREATININE 1.29 (H) 01/31/2023 1322   CREATININE 1.24 (H) 07/18/2022 1130      Component Value Date/Time   CALCIUM 9.3 01/31/2023 1322   ALKPHOS 55 01/31/2023 1322   AST 32 01/31/2023 1322   AST 19 07/18/2022 1130   ALT 22 01/31/2023 1322   ALT 17 07/18/2022 1130   BILITOT 0.8 01/31/2023 1322   BILITOT 0.5 07/18/2022 1130       RADIOGRAPHIC STUDIES: No results found.  ASSESSMENT AND PLAN:    Deep Vein Thrombosis (DVT) DVT in the left leg, currently on Eliquis post-knee replacement surgery. No symptoms of chest pain, dyspnea, nausea, or vomiting. Eliquis therapy for 3-6 weeks post-surgery to prevent recurrence.  Hypercoagulability status to be evaluated post-Eliquis for long-term anticoagulation needs. - Continue Eliquis 2.5 mg for 3-6 weeks post-surgery - Order hypercoagulability panel after completion of Eliquis therapy - Follow-up in 3 months for repeat blood work and assessment  Post-Knee Replacement Management Post-operative status following knee replacement on January 27. Currently on anticoagulation therapy to prevent post-surgical DVT. - Continue Eliquis 2.5 mg for 3-6 weeks post-surgery - Monitor for signs of DVT or other complications  Follow-up - Follow-up appointment in 3 months - Repeat blood work at follow-up to determine need for continued anticoagulation.   Patient was advised to call immediately if she has any other concerning symptoms in the interval. The patient voices understanding of current disease status and treatment options and is in agreement with the current care plan.  All questions were answered. The patient knows to call the clinic with any problems, questions or concerns. We can certainly see the patient much sooner if necessary.  The total time spent in the appointment was 20 minutes.  Disclaimer: This note was dictated with voice recognition software. Similar sounding words can inadvertently be transcribed and may not be corrected upon review.

## 2023-03-19 ENCOUNTER — Ambulatory Visit: Attending: Orthopedic Surgery | Admitting: Physical Therapy

## 2023-03-19 ENCOUNTER — Encounter: Payer: Self-pay | Admitting: Physical Therapy

## 2023-03-19 ENCOUNTER — Other Ambulatory Visit: Payer: Self-pay

## 2023-03-19 DIAGNOSIS — M6281 Muscle weakness (generalized): Secondary | ICD-10-CM | POA: Diagnosis present

## 2023-03-19 DIAGNOSIS — M25562 Pain in left knee: Secondary | ICD-10-CM | POA: Diagnosis present

## 2023-03-19 DIAGNOSIS — R262 Difficulty in walking, not elsewhere classified: Secondary | ICD-10-CM

## 2023-03-19 NOTE — Therapy (Signed)
 OUTPATIENT PHYSICAL THERAPY LOWER EXTREMITY EVALUATION   Patient Name: Tina Bryant MRN: [id number] DOB:1945/09/25, 78 y.o., female Today's Date: 03/19/2023  END OF SESSION:  PT End of Session - 03/19/23 1057     Visit Number 1    Number of Visits 16    Date for PT Re-Evaluation 05/14/23    Authorization Type [employer]    PT Start Time 1100    PT Stop Time 1145    PT Time Calculation (min) 45 min    Activity Tolerance Patient limited by pain    Behavior During Therapy WFL for tasks assessed/performed             Past Medical History:  Diagnosis Date   Arthritis    Chronic kidney disease    CKD3   DVT (deep venous thrombosis) (HCC)    Hepatitis    b   Hypertension    Pneumonia    Pre-diabetes    Past Surgical History:  Procedure Laterality Date   EYE SURGERY Left    strabismus surgery   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   Procedure: TOTAL KNEE ARTHROPLASTY, OPEN REDUCTION INTERNAL FIXATION OF MEDIAL FEMORAL CONDYLE;  Surgeon: Jodi Geralds, MD;  Location: [facility];  Service: Orthopedics;  Laterality: Left;   Patient Active Problem List   Diagnosis Date Noted   Primary osteoarthritis of left knee 02/03/2023   DVT (deep venous thrombosis) (HCC) 11/01/2022   Fall at home, initial encounter 11/01/2022   CAP (community acquired pneumonia) 11/01/2022   Severe sepsis (HCC) 11/01/2022   AKI (acute kidney injury) (HCC) 11/01/2022   Acute metabolic encephalopathy 11/01/2022   CKD (chronic kidney disease) stage 3, GFR 30-59 ml/min (HCC) 11/01/2022   Elevated troponin 11/01/2022   Non-insulin treated type 2 diabetes mellitus (HCC) 11/01/2022   High anion gap metabolic acidosis 11/01/2022   Acute deep vein thrombosis (DVT) of femoral vein of left lower extremity (HCC) 06/08/2022   Hyperlipidemia 07/09/2006   Microcytic anemia 07/09/2006   DEPRESSION 07/09/2006   Essential hypertension 07/09/2006    PCP: Felix Pacini, FNP   REFERRING  PROVIDER: Jodi Geralds, MD   REFERRING DIAG: Left TKA  THERAPY DIAG:  Acute pain of left knee - Plan: PT plan of care cert/re-cert  Muscle weakness (generalized) - Plan: PT plan of care cert/re-cert  Difficulty in walking, not elsewhere classified - Plan: PT plan of care cert/re-cert  Rationale for Evaluation and Treatment: Rehabilitation  ONSET DATE: 02-04-23 surgery L TKA had trouble for many years before surgery  SUBJECTIVE:   SUBJECTIVE STATEMENT: Had the surgery on February 04, 2023 of L TKA with Dr Luiz Blare. I have been doing exercises that I received from my HHPT.  I have been trying to walk a lot and I walk every day. I do not drive. I can stand about 5 minutes right now. I can sit as long as I want. I want to go back to church  PERTINENT HISTORY: Hx of DVT, hyperlipdemia, HTN, Hx of fall but not in last 6 months hepatitis PAIN:  Are you having pain? Yes: NPRS scale: 7/10 in left knee and at worst 10/10 Pain location: Left knee  Pain description: sometimes sharp going down to foot Aggravating factors: standing on leg for longer than 5 minutes, straightening the leg, swelling in left knee, I can't get in the tub without difficulty, mop floor and household chores, can't shop and carry groceries Relieving factors: not much right now Can't bend over to do  grooming and cut my toe nails PRECAUTIONS: Other: post left TKA  RED FLAGS: None   WEIGHT BEARING RESTRICTIONS: Yes WBAT L  FALLS:  Has patient fallen in last 6 months? No  LIVING ENVIRONMENT: Lives with: lives alone Lives in: House/apartment Stairs: Yes: External: 2 steps; on left going up Has following equipment at home: Single point cane, Walker - 2 wheeled, shower chair, and Grab bars  OCCUPATION: retired    used to work at group home  PLOF: Independent  PATIENT GOALS: I want to be able to walk with a cane and to go back to church.  NEXT MD VISIT: TBD  OBJECTIVE:  Note: Objective measures were completed at  Evaluation unless otherwise noted.  DIAGNOSTIC FINDINGS: see imaging  PATIENT SURVEYS:  LEFS 18/80 22.5%  COGNITION: Overall cognitive status: Within functional limits for tasks assessed     SENSATION: WFL  EDEMA:  Circumferential: R knee 44.0cm and Left knee is 48.25 cm  MUSCLE LENGTH: Hamstrings: Right 54 deg; Left 50 deg Thomas test: + bil  POSTURE: rounded shoulders, forward head, flexed trunk , and weight shift right  PALPATION: Well healing TKA L but has a wound opending on medial side of scar  LOWER EXTREMITY ROM:  Active ROM Right eval Left eval  Hip flexion    Hip extension    Hip abduction    Hip adduction    Hip internal rotation    Hip external rotation    Knee flexion 133/P 138 99 P 109  Knee extension -5 -11 ext  Ankle dorsiflexion    Ankle plantarflexion    Ankle inversion    Ankle eversion     (Blank rows = not tested)  LOWER EXTREMITY MMT:  MMT Right eval Left eval  Hip flexion 4 4-  Hip extension 4 4-  Hip abduction 3+ 3-  Hip adduction    Hip internal rotation    Hip external rotation    Knee flexion 4+ 3-  Knee extension 4 3-  Ankle dorsiflexion    Ankle plantarflexion    Ankle inversion    Ankle eversion     (Blank rows = not tested)  LOWER EXTREMITY SPECIAL TESTS:  NT due to surgery  FUNCTIONAL TESTS:  5 times sit to stand: unable to stand without using hands  30 sec with UE support  3 x 2 minute walk test: 158 ft (462 ft Norm) using rolling walker  GAIT: Distance walked: 131ft 2 MWT Assistive device utilized: Walker - 2 wheeled Level of assistance: Modified independence Comments: Pt with antalgic gait with 2 wheeled walker                                                                                                                                TREATMENT DATE: eval and issue HEP    PATIENT EDUCATION:  Education details: POC explanation of findings, issue HEP  Person educated: Patient Education method:  Explanation,  Demonstration, Tactile cues, Verbal cues, and Handouts Education comprehension: verbalized understanding, returned demonstration, verbal cues required, tactile cues required, and needs further education  HOME EXERCISE PROGRAM: Access Code: [email or web address] URL: [email or web address] Date: 03/19/2023 Prepared by: Garen Lah  Exercises - Supine Knee Extension Stretch on Towel Roll  - 1 x daily - 7 x weekly - 3 sets - 10 reps - 5 sec  hold - Supine Heel Slide with Strap  - 1 x daily - 7 x weekly - 3 sets - 10 reps - Supine Short Arc Quad  - 1 x daily - 7 x weekly - 3 sets - 10 reps - Seated Hamstring Stretch  - 1-2 x daily - 7 x weekly - 1 sets - 3 reps - 30 sec hold - Long Arc Quad  - 1 x daily - 7 x weekly - 3 sets - 10 reps - Seated Knee Flexion AAROM  - 1 x daily - 7 x weekly - 3 sets - 10 reps - Mini Squat  - 1 x daily - 7 x weekly - 3 sets - 10 reps  ASSESSMENT:  CLINICAL IMPRESSION: Patient is a 78 y.o. female who was seen today for physical therapy evaluation and treatment for L TKA. Pt has hx of DVT on Eliquis , sedentary life style and decreased mobility.  Pt lives alone and would like to improve mobility in order to safely live on her own.  Pt would like to be able to safely enter and exit tub and perform household chores safely in home with adequate sleep.  Pt tend to be more sedentary and understands she needs to improve mobility in order to prenvent DVTS.  Pt will benefit from skilled PT to address impairments and maximize functional mobility  OBJECTIVE IMPAIRMENTS: Abnormal gait, decreased activity tolerance, decreased knowledge of use of DME, decreased mobility, difficulty walking, decreased ROM, decreased strength, increased edema, postural dysfunction, obesity, and pain.   ACTIVITY LIMITATIONS: carrying, lifting, bending, standing, squatting, sleeping, stairs, transfers, hygiene/grooming, and locomotion level  PARTICIPATION LIMITATIONS: meal prep,  cleaning, laundry, shopping, and church  PERSONAL FACTORS: Hx of DVT, hyperlipdemia, HTN, Hx of fall but not in last 6 months are also affecting patient's functional outcome. hepatitis  REHAB POTENTIAL: Good  CLINICAL DECISION MAKING: Evolving/moderate complexity  EVALUATION COMPLEXITY: Moderate   GOALS: Goals reviewed with patient? Yes  SHORT TERM GOALS: Target date: 04-09-23  Pt will be independent with initial HEP Baseline:no knowledge Goal status: INITIAL  2.  AROM of knee extension -10 to 100 flexion to increase mobility for transitional movements Baseline: eval left flexion 99 degrees Goal status: INITIAL  3.  Demonstrate and verbalize understanding of condition management including RICE, positioning, use of A.D., HEP Baseline: using 2 wheel walker at eval Goal status: INITIAL    LONG TERM GOALS: Target date: 05-14-23  Pt will be independent with advanced HEP Baseline: no knowledge Goal status: INITIAL  2.  Pt will be able demostrate 5 x STS in uncer 15 seconds to show improved LE strength. Baseline: Pt with heavy use of arms for 30 sec STS 3 x only Goal status: INITIAL  3.  Pt will be able to use LRAD in order to walk and return to church Baseline: using 2 wheeled walker  Goal status: INITIAL  4.  Pt will be educated and demonstrate ability to rise from ground to standing to encourage safety in home living alone Baseline: unable to perform floor to stand x fer Goal status: INITIAL  5.  Pt will improve her L knee flexion to  >/= 120 degrees and extension to </= 5 degrees with </= 2/10 pain for a more functional and efficient gait pattern Baseline: See AROM chart Goal status: INITIAL  6.  Patient will report improved functional level on LEFS >/= 50/80 in order to allow for return to prior exercise level Baseline: eval 18/80 22.5 % Goal status: INITIAL   PLAN:  PT FREQUENCY: 2x/week  PT DURATION: 8 weeks  PLANNED INTERVENTIONS: 97164- PT Re-evaluation,  97110-Therapeutic exercises, 97530- Therapeutic activity, 97112- Neuromuscular re-education, 97535- Self Care, 43329- Manual therapy, [phone]- Gait training, [phone]- Electrical stimulation (manual), Patient/Family education, Balance training, Stair training, Taping, Dry Needling, Joint mobilization, DME instructions, Cryotherapy, and Moist heat  PLAN FOR NEXT SESSION: HEP, manual for increasing knee flex / ext   Garen Lah, PT, [facility] Certified Exercise Expert for the Aging Adult  03/19/23 5:01 PM Phone: [phone] Fax: [phone]   For all possible CPT codes, reference the Planned Interventions line above.     Check all conditions that are expected to impact treatment: {Conditions expected to impact treatment:Morbid obesity, Diabetes mellitus, and Musculoskeletal disorders   If treatment provided at initial evaluation, no treatment charged due to lack of authorization.

## 2023-03-20 NOTE — Therapy (Signed)
 OUTPATIENT PHYSICAL THERAPY LOWER EXTREMITY TREATMENT   Patient Name: Tina Bryant MRN: [id number] DOB:[birth year], 78 y.o., female Today's Date: 03/21/2023  END OF SESSION:  PT End of Session - 03/21/23 1339     Visit Number 2    Number of Visits 16    Date for PT Re-Evaluation 05/14/23    Authorization Type [place] [facility] [employer]    PT Start Time 0136    PT Stop Time 0215    PT Time Calculation (min) 39 min    Activity Tolerance Patient limited by pain;Patient tolerated treatment well    Behavior During Therapy [facility] for tasks assessed/performed              Past Medical History:  Diagnosis Date   Arthritis    Chronic kidney disease    CKD3   DVT (deep venous thrombosis) (HCC)    Hepatitis    b   Hypertension    Pneumonia    Pre-diabetes    Past Surgical History:  Procedure Laterality Date   EYE SURGERY Left    strabismus surgery   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   Procedure: TOTAL KNEE ARTHROPLASTY, OPEN REDUCTION INTERNAL FIXATION OF MEDIAL FEMORAL CONDYLE;  Surgeon: Jodi Geralds, MD;  Location: [facility];  Service: Orthopedics;  Laterality: Left;   Patient Active Problem List   Diagnosis Date Noted   Primary osteoarthritis of left knee 02/03/2023   DVT (deep venous thrombosis) (HCC) 11/01/2022   Fall at home, initial encounter 11/01/2022   CAP (community acquired pneumonia) 11/01/2022   Severe sepsis (HCC) 11/01/2022   AKI (acute kidney injury) (HCC) 11/01/2022   Acute metabolic encephalopathy 11/01/2022   CKD (chronic kidney disease) stage 3, GFR 30-59 ml/min (HCC) 11/01/2022   Elevated troponin 11/01/2022   Non-insulin treated type 2 diabetes mellitus (HCC) 11/01/2022   High anion gap metabolic acidosis 11/01/2022   Acute deep vein thrombosis (DVT) of femoral vein of left lower extremity (HCC) 06/08/2022   Hyperlipidemia 07/09/2006   Microcytic anemia 07/09/2006   DEPRESSION 07/09/2006   Essential hypertension 07/09/2006    PCP: Felix Pacini, FNP   REFERRING PROVIDER: Jodi Geralds, MD   REFERRING DIAG: Left TKA  THERAPY DIAG:  Acute pain of left knee  Muscle weakness (generalized)  Difficulty in walking, not elsewhere classified  Rationale for Evaluation and Treatment: Rehabilitation  ONSET DATE: 02-04-23 surgery L TKA had trouble for many years before surgery  SUBJECTIVE:   SUBJECTIVE STATEMENT:  I am doing Ok today. I am about a 7/10. I tried to work on my swelling  I was wondering about getting a rollator  EVAL -Had the surgery on February 04, 2023 of L TKA with Dr Luiz Blare. I have been doing exercises that I received from my HHPT.  I have been trying to walk a lot and I walk every day. I do not drive. I can stand about 5 minutes right now. I can sit as long as I want. I want to go back to church  PERTINENT HISTORY: Hx of DVT, hyperlipdemia, HTN, Hx of fall but not in last 6 months hepatitis PAIN:  Are you having pain? Yes: NPRS scale: 7/10 in left knee and at worst 10/10 Pain location: Left knee  Pain description: sometimes sharp going down to foot Aggravating factors: standing on leg for longer than 5 minutes, straightening the leg, swelling in left knee, I can't get in the tub without difficulty, mop floor and household chores, can't shop and carry groceries  Relieving factors: not much right now Can't bend over to do grooming and cut my toe nails PRECAUTIONS: Other: post left TKA  RED FLAGS: None   WEIGHT BEARING RESTRICTIONS: Yes WBAT L  FALLS:  Has patient fallen in last 6 months? No  LIVING ENVIRONMENT: Lives with: lives alone Lives in: House/apartment Stairs: Yes: External: 2 steps; on left going up Has following equipment at home: Single point cane, Walker - 2 wheeled, shower chair, and Grab bars  OCCUPATION: retired    used to work at group home  PLOF: Independent  PATIENT GOALS: I want to be able to walk with a cane and to go back to church.  NEXT MD VISIT: TBD  OBJECTIVE:   Note: Objective measures were completed at Evaluation unless otherwise noted.  DIAGNOSTIC FINDINGS: see imaging  PATIENT SURVEYS:  LEFS 18/80 22.5%  COGNITION: Overall cognitive status: Within functional limits for tasks assessed     SENSATION: WFL  EDEMA:  Circumferential: R knee 44.0cm and Left knee is 48.25 cm  MUSCLE LENGTH: Hamstrings: Right 54 deg; Left 50 deg Thomas test: + bil  POSTURE: rounded shoulders, forward head, flexed trunk , and weight shift right  PALPATION: Well healing TKA L but has a wound opending on medial side of scar  LOWER EXTREMITY ROM:  Active ROM Right eval Left eval Left 03-21-23  Hip flexion     Hip extension     Hip abduction     Hip adduction     Hip internal rotation     Hip external rotation     Knee flexion 133/P 138 99 P 109 A105  Knee extension -5 -11 ext -10 ext  Ankle dorsiflexion     Ankle plantarflexion     Ankle inversion     Ankle eversion      (Blank rows = not tested)  LOWER EXTREMITY MMT:  MMT Right eval Left eval  Hip flexion 4 4-  Hip extension 4 4-  Hip abduction 3+ 3-  Hip adduction    Hip internal rotation    Hip external rotation    Knee flexion 4+ 3-  Knee extension 4 3-  Ankle dorsiflexion    Ankle plantarflexion    Ankle inversion    Ankle eversion     (Blank rows = not tested)  LOWER EXTREMITY SPECIAL TESTS:  NT due to surgery  FUNCTIONAL TESTS:  5 times sit to stand: unable to stand without using hands  30 sec with UE support  3 x 2 minute walk test: 158 ft (462 ft Norm) using rolling walker  GAIT: Distance walked: 11ft 2 MWT Assistive device utilized: Walker - 2 wheeled Level of assistance: Modified independence Comments: Pt with antalgic gait with 2 wheeled walker  [facility] Adult PT Treatment:                                                DATE: 03-21-23 Therapeutic Exercise: LAQ on left 3 x 10 SAQ 2 x 10 over bolster on L SLR  L 6 x then 4 x, rest 90 sec then 5 x then 3 x to  fatigue Heel  slides 2 x 8 Manual Therapy: AP grade III with pt actively flexion knee between sets Tibial ER with overpressure to promote knee extension combined with active quad set ( pt with weakened quad set PT with  retrograde massage for edema from knee to groin  Therapeutic Activity:  Gait training with SPC.  Pt ambulates 200 ft with some dependence on PT with left UE. Pt with decreased dependence with increased footage but not ready to I utilize at home STS with UE support with PT 5 x and moderate encouragement from mat STS 2 x 5 with 2 min rest at sink and chair squat into chair SELF Care-  Education about DVT and importance of walking and movement for recovery.  Pt also given information about local medical supply companies                                                                                                                               TREATMENT DATE: eval and issue HEP    PATIENT EDUCATION:  Education details: POC explanation of findings, issue HEP  Person educated: Patient Education method: Explanation, Demonstration, Tactile cues, Verbal cues, and Handouts Education comprehension: verbalized understanding, returned demonstration, verbal cues required, tactile cues required, and needs further education  HOME EXERCISE PROGRAM: Access Code: [email or web address]  updated 03-21-23 URL: [email or web address] Date: 03/21/2023 Prepared by: Garen Lah  Exercises - Sit to stand with sink support Movement snack  - 1 x daily - 7 x weekly - 3 sets - 10 reps - Supine Knee Extension Stretch on Towel Roll  - 1 x daily - 7 x weekly - 3 sets - 10 reps - 5 sec  hold - Supine Heel Slide with Strap  - 1 x daily - 7 x weekly - 3 sets - 10 reps - Supine Short Arc Quad  - 1 x daily - 7 x weekly - 3 sets - 10 reps - Seated Hamstring Stretch  - 1-2 x daily - 7 x weekly - 1 sets - 3 reps - 30 sec hold - Long Arc Quad  - 1 x daily - 7 x weekly - 3 sets - 10 reps - Seated Knee  Flexion AAROM  - 1 x daily - 7 x weekly - 3 sets - 10 reps  ASSESSMENT:  CLINICAL IMPRESSION:  Pt enters clinic with 7/10 pain and reports that she got out of bed in the morning to walk and stayed in bed the rest of the day.  Pt was educated on local medical supplies and DVT education/movement for prevention.  Pt need mod encouragement to complete exercises and questionable if she is completing HEP at home adequate.  Emphasis today on Gait training and STS for functional movement at home especially living at home alone.  Improvement in AROM but show quad lag and weakness in L LE   EVAL- Patient is a 78 y.o. female who was seen today for physical therapy evaluation and treatment for L TKA. Pt has hx of DVT on Eliquis , sedentary life style and decreased mobility.  Pt lives alone and would like to improve mobility in order to safely live on her own.  Pt  would like to be able to safely enter and exit tub and perform household chores safely in home with adequate sleep.  Pt tend to be more sedentary and understands she needs to improve mobility in order to prenvent DVTS.  Pt will benefit from skilled PT to address impairments and maximize functional mobility  OBJECTIVE IMPAIRMENTS: Abnormal gait, decreased activity tolerance, decreased knowledge of use of DME, decreased mobility, difficulty walking, decreased ROM, decreased strength, increased edema, postural dysfunction, obesity, and pain.   ACTIVITY LIMITATIONS: carrying, lifting, bending, standing, squatting, sleeping, stairs, transfers, hygiene/grooming, and locomotion level  PARTICIPATION LIMITATIONS: meal prep, cleaning, laundry, shopping, and church  PERSONAL FACTORS: Hx of DVT, hyperlipdemia, HTN, Hx of fall but not in last 6 months are also affecting patient's functional outcome. hepatitis  REHAB POTENTIAL: Good  CLINICAL DECISION MAKING: Evolving/moderate complexity  EVALUATION COMPLEXITY: Moderate   GOALS: Goals reviewed with  patient? Yes  SHORT TERM GOALS: Target date: 04-09-23  Pt will be independent with initial HEP Baseline:no knowledge Goal status: INITIAL  2.  AROM of knee extension -10 to 100 flexion to increase mobility for transitional movements Baseline: eval left flexion 99 degrees Goal status: INITIAL  3.  Demonstrate and verbalize understanding of condition management including RICE, positioning, use of A.D., HEP Baseline: using 2 wheel walker at eval Goal status: INITIAL    LONG TERM GOALS: Target date: 05-14-23  Pt will be independent with advanced HEP Baseline: no knowledge Goal status: INITIAL  2.  Pt will be able demostrate 5 x STS in uncer 15 seconds to show improved LE strength. Baseline: Pt with heavy use of arms for 30 sec STS 3 x only Goal status: INITIAL  3.  Pt will be able to use LRAD in order to walk and return to church Baseline: using 2 wheeled walker  Goal status: INITIAL  4.  Pt will be educated and demonstrate ability to rise from ground to standing to encourage safety in home living alone Baseline: unable to perform floor to stand x fer Goal status: INITIAL  5.  Pt will improve her L knee flexion to  >/= 120 degrees and extension to </= 5 degrees with </= 2/10 pain for a more functional and efficient gait pattern Baseline: See AROM chart Goal status: INITIAL  6.  Patient will report improved functional level on LEFS >/= 50/80 in order to allow for return to prior exercise level Baseline: eval 18/80 22.5 % Goal status: INITIAL   PLAN:  PT FREQUENCY: 2x/week  PT DURATION: 8 weeks  PLANNED INTERVENTIONS: 97164- PT Re-evaluation, 97110-Therapeutic exercises, 97530- Therapeutic activity, 97112- Neuromuscular re-education, 97535- Self Care, 54098- Manual therapy, [phone]- Gait training, [phone]- Electrical stimulation (manual), Patient/Family education, Balance training, Stair training, Taping, Dry Needling, Joint mobilization, DME instructions, Cryotherapy, and Moist  heat  PLAN FOR NEXT SESSION: HEP, manual for increasing knee flex / ext  Garen Lah, PT, [facility] Certified Exercise Expert for the Aging Adult  03/21/23 2:34 PM Phone: [phone] Fax: [phone]   For all possible CPT codes, reference the Planned Interventions line above.     Check all conditions that are expected to impact treatment: {Conditions expected to impact treatment:Morbid obesity, Diabetes mellitus, and Musculoskeletal disorders   If treatment provided at initial evaluation, no treatment charged due to lack of authorization.

## 2023-03-21 ENCOUNTER — Ambulatory Visit: Admitting: Physical Therapy

## 2023-03-21 DIAGNOSIS — R262 Difficulty in walking, not elsewhere classified: Secondary | ICD-10-CM

## 2023-03-21 DIAGNOSIS — M6281 Muscle weakness (generalized): Secondary | ICD-10-CM

## 2023-03-21 DIAGNOSIS — M25562 Pain in left knee: Secondary | ICD-10-CM

## 2023-03-26 ENCOUNTER — Encounter: Payer: Self-pay | Admitting: Physical Therapy

## 2023-03-26 ENCOUNTER — Ambulatory Visit: Admitting: Physical Therapy

## 2023-03-26 DIAGNOSIS — M6281 Muscle weakness (generalized): Secondary | ICD-10-CM

## 2023-03-26 DIAGNOSIS — M25562 Pain in left knee: Secondary | ICD-10-CM

## 2023-03-26 NOTE — Therapy (Signed)
 OUTPATIENT PHYSICAL THERAPY LOWER EXTREMITY TREATMENT   Patient Name: Tina Bryant MRN: [id number] DOB:10/18/1945, 78 y.o., female Today's Date: 03/26/2023  END OF SESSION:  PT End of Session - 03/26/23 1315     Visit Number 3    Number of Visits 16    Date for PT Re-Evaluation 05/14/23    Authorization Type [place] [employer]    Authorization Time Period 03/21/23-06/19/23    Authorization - Visit Number 2    Authorization - Number of Visits 13    PT Start Time 1315    PT Stop Time 1358    PT Time Calculation (min) 43 min              Past Medical History:  Diagnosis Date   Arthritis    Chronic kidney disease    CKD3   DVT (deep venous thrombosis) (HCC)    Hepatitis    b   Hypertension    Pneumonia    Pre-diabetes    Past Surgical History:  Procedure Laterality Date   EYE SURGERY Left    strabismus surgery   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   Procedure: TOTAL KNEE ARTHROPLASTY, OPEN REDUCTION INTERNAL FIXATION OF MEDIAL FEMORAL CONDYLE;  Surgeon: Jodi Geralds, MD;  Location: [facility];  Service: Orthopedics;  Laterality: Left;   Patient Active Problem List   Diagnosis Date Noted   Primary osteoarthritis of left knee 02/03/2023   DVT (deep venous thrombosis) (HCC) 11/01/2022   Fall at home, initial encounter 11/01/2022   CAP (community acquired pneumonia) 11/01/2022   Severe sepsis (HCC) 11/01/2022   AKI (acute kidney injury) (HCC) 11/01/2022   Acute metabolic encephalopathy 11/01/2022   CKD (chronic kidney disease) stage 3, GFR 30-59 ml/min (HCC) 11/01/2022   Elevated troponin 11/01/2022   Non-insulin treated type 2 diabetes mellitus (HCC) 11/01/2022   High anion gap metabolic acidosis 11/01/2022   Acute deep vein thrombosis (DVT) of femoral vein of left lower extremity (HCC) 06/08/2022   Hyperlipidemia 07/09/2006   Microcytic anemia 07/09/2006   DEPRESSION 07/09/2006   Essential hypertension 07/09/2006    PCP: Felix Pacini, FNP    REFERRING PROVIDER: Jodi Geralds, MD   REFERRING DIAG: Left TKA  THERAPY DIAG:  Acute pain of left knee  Muscle weakness (generalized)  Rationale for Evaluation and Treatment: Rehabilitation  ONSET DATE: 02-04-23 surgery L TKA had trouble for many years before surgery  SUBJECTIVE:   SUBJECTIVE STATEMENT:  I am trying to make it . I am about a 6/10. I elevated my legs with 2 pillows and it helped a lot.   EVAL -Had the surgery on February 04, 2023 of L TKA with Dr Luiz Blare. I have been doing exercises that I received from my HHPT.  I have been trying to walk a lot and I walk every day. I do not drive. I can stand about 5 minutes right now. I can sit as long as I want. I want to go back to church  PERTINENT HISTORY: Hx of DVT, hyperlipdemia, HTN, Hx of fall but not in last 6 months hepatitis PAIN:  Are you having pain? Yes: NPRS scale: 6/10 in left knee and at worst 10/10 Pain location: Left knee  Pain description: sometimes sharp going down to foot Aggravating factors: standing on leg for longer than 5 minutes, straightening the leg, swelling in left knee, I can't get in the tub without difficulty, mop floor and household chores, can't shop and carry groceries Relieving factors: not much right  now Can't bend over to do grooming and cut my toe nails PRECAUTIONS: Other: post left TKA  RED FLAGS: None   WEIGHT BEARING RESTRICTIONS: Yes WBAT L  FALLS:  Has patient fallen in last 6 months? No  LIVING ENVIRONMENT: Lives with: lives alone Lives in: House/apartment Stairs: Yes: External: 2 steps; on left going up Has following equipment at home: Single point cane, Walker - 2 wheeled, shower chair, and Grab bars  OCCUPATION: retired    used to work at group home  PLOF: Independent  PATIENT GOALS: I want to be able to walk with a cane and to go back to church.  NEXT MD VISIT: TBD  OBJECTIVE:  Note: Objective measures were completed at Evaluation unless otherwise  noted.  DIAGNOSTIC FINDINGS: see imaging  PATIENT SURVEYS:  LEFS 18/80 22.5%  COGNITION: Overall cognitive status: Within functional limits for tasks assessed     SENSATION: WFL  EDEMA:  Circumferential: R knee 44.0cm and Left knee is 48.25 cm  MUSCLE LENGTH: Hamstrings: Right 54 deg; Left 50 deg Thomas test: + bil  POSTURE: rounded shoulders, forward head, flexed trunk , and weight shift right  PALPATION: Well healing TKA L but has a wound opending on medial side of scar  LOWER EXTREMITY ROM:  Active ROM Right eval Left eval Left 03-21-23  Hip flexion     Hip extension     Hip abduction     Hip adduction     Hip internal rotation     Hip external rotation     Knee flexion 133/P 138 99 P 109 A105  Knee extension -5 -11 ext -10 ext  Ankle dorsiflexion     Ankle plantarflexion     Ankle inversion     Ankle eversion      (Blank rows = not tested)  LOWER EXTREMITY MMT:  MMT Right eval Left eval  Hip flexion 4 4-  Hip extension 4 4-  Hip abduction 3+ 3-  Hip adduction    Hip internal rotation    Hip external rotation    Knee flexion 4+ 3-  Knee extension 4 3-  Ankle dorsiflexion    Ankle plantarflexion    Ankle inversion    Ankle eversion     (Blank rows = not tested)  LOWER EXTREMITY SPECIAL TESTS:  NT due to surgery  FUNCTIONAL TESTS:  5 times sit to stand: unable to stand without using hands  30 sec with UE support  3 x 2 minute walk test: 158 ft (462 ft Norm) using rolling walker  GAIT: Distance walked: 174ft 2 MWT Assistive device utilized: Walker - 2 wheeled Level of assistance: Modified independence Comments: Pt with antalgic gait with 2 wheeled walker  [facility] Adult PT Treatment:                                                DATE: 03/26/23 Therapeutic Exercise: Supine QS with heel prop  SAQ 10 x 3   Therapeutic Activity: Nustep L3 UE/LE for activity tolerance  STS 5 x 2 without UE from elevated mat  Seated Knee extension AROM 10 x  3 SLR AROM 5 x 3  Supine knee flexion AAROM 10 x 3     [facility] Adult PT Treatment:  DATE: 03-21-23 Therapeutic Exercise: LAQ on left 3 x 10 SAQ 2 x 10 over bolster on L SLR  L 6 x then 4 x, rest 90 sec then 5 x then 3 x to fatigue Heel  slides 2 x 8 Manual Therapy: AP grade III with pt actively flexion knee between sets Tibial ER with overpressure to promote knee extension combined with active quad set ( pt with weakened quad set PT with retrograde massage for edema from knee to groin  Therapeutic Activity:  Gait training with SPC.  Pt ambulates 200 ft with some dependence on PT with left UE. Pt with decreased dependence with increased footage but not ready to I utilize at home STS with UE support with PT 5 x and moderate encouragement from mat STS 2 x 5 with 2 min rest at sink and chair squat into chair SELF Care-  Education about DVT and importance of walking and movement for recovery.  Pt also given information about local medical supply companies                                                                                                                               TREATMENT DATE: eval and issue HEP    PATIENT EDUCATION:  Education details: POC explanation of findings, issue HEP  Person educated: Patient Education method: Explanation, Demonstration, Tactile cues, Verbal cues, and Handouts Education comprehension: verbalized understanding, returned demonstration, verbal cues required, tactile cues required, and needs further education  HOME EXERCISE PROGRAM: Access Code: [email or web address]  updated 03-21-23 URL: [email or web address] Date: 03/21/2023 Prepared by: Garen Lah  Exercises - Sit to stand with sink support Movement snack  - 1 x daily - 7 x weekly - 3 sets - 10 reps - Supine Knee Extension Stretch on Towel Roll  - 1 x daily - 7 x weekly - 3 sets - 10 reps - 5 sec  hold - Supine Heel Slide with Strap  - 1 x  daily - 7 x weekly - 3 sets - 10 reps - Supine Short Arc Quad  - 1 x daily - 7 x weekly - 3 sets - 10 reps - Seated Hamstring Stretch  - 1-2 x daily - 7 x weekly - 1 sets - 3 reps - 30 sec hold - Long Arc Quad  - 1 x daily - 7 x weekly - 3 sets - 10 reps - Seated Knee Flexion AAROM  - 1 x daily - 7 x weekly - 3 sets - 10 reps  ASSESSMENT:  CLINICAL IMPRESSION:  Pt enters clinic with 6/10 pain and reports that elevated her legs at home and it helped her edema. Began Nustep for activity tolerance which she did well with. Continued with STS and mat based LE ROM and strengthening.  Continues to demonstrate quad lag and fatigues quickly.    EVAL- Patient is a 78 y.o. female who was seen today for physical therapy evaluation and treatment for L  TKA. Pt has hx of DVT on Eliquis , sedentary life style and decreased mobility.  Pt lives alone and would like to improve mobility in order to safely live on her own.  Pt would like to be able to safely enter and exit tub and perform household chores safely in home with adequate sleep.  Pt tend to be more sedentary and understands she needs to improve mobility in order to prenvent DVTS.  Pt will benefit from skilled PT to address impairments and maximize functional mobility  OBJECTIVE IMPAIRMENTS: Abnormal gait, decreased activity tolerance, decreased knowledge of use of DME, decreased mobility, difficulty walking, decreased ROM, decreased strength, increased edema, postural dysfunction, obesity, and pain.   ACTIVITY LIMITATIONS: carrying, lifting, bending, standing, squatting, sleeping, stairs, transfers, hygiene/grooming, and locomotion level  PARTICIPATION LIMITATIONS: meal prep, cleaning, laundry, shopping, and church  PERSONAL FACTORS: Hx of DVT, hyperlipdemia, HTN, Hx of fall but not in last 6 months are also affecting patient's functional outcome. hepatitis  REHAB POTENTIAL: Good  CLINICAL DECISION MAKING: Evolving/moderate complexity  EVALUATION  COMPLEXITY: Moderate   GOALS: Goals reviewed with patient? Yes  SHORT TERM GOALS: Target date: 04-09-23  Pt will be independent with initial HEP Baseline:no knowledge Goal status: INITIAL  2.  AROM of knee extension -10 to 100 flexion to increase mobility for transitional movements Baseline: eval left flexion 99 degrees Goal status: INITIAL  3.  Demonstrate and verbalize understanding of condition management including RICE, positioning, use of A.D., HEP Baseline: using 2 wheel walker at eval Goal status: INITIAL    LONG TERM GOALS: Target date: 05-14-23  Pt will be independent with advanced HEP Baseline: no knowledge Goal status: INITIAL  2.  Pt will be able demostrate 5 x STS in uncer 15 seconds to show improved LE strength. Baseline: Pt with heavy use of arms for 30 sec STS 3 x only Goal status: INITIAL  3.  Pt will be able to use LRAD in order to walk and return to church Baseline: using 2 wheeled walker  Goal status: INITIAL  4.  Pt will be educated and demonstrate ability to rise from ground to standing to encourage safety in home living alone Baseline: unable to perform floor to stand x fer Goal status: INITIAL  5.  Pt will improve her L knee flexion to  >/= 120 degrees and extension to </= 5 degrees with </= 2/10 pain for a more functional and efficient gait pattern Baseline: See AROM chart Goal status: INITIAL  6.  Patient will report improved functional level on LEFS >/= 50/80 in order to allow for return to prior exercise level Baseline: eval 18/80 22.5 % Goal status: INITIAL   PLAN:  PT FREQUENCY: 2x/week  PT DURATION: 8 weeks  PLANNED INTERVENTIONS: 97164- PT Re-evaluation, 97110-Therapeutic exercises, 97530- Therapeutic activity, 97112- Neuromuscular re-education, 97535- Self Care, 84132- Manual therapy, [phone]- Gait training, [phone]- Electrical stimulation (manual), Patient/Family education, Balance training, Stair training, Taping, Dry Needling, Joint  mobilization, DME instructions, Cryotherapy, and Moist heat  PLAN FOR NEXT SESSION: HEP, manual for increasing knee flex / ext  Jannette Spanner, PTA 03/26/23 2:11 PM Phone: [phone] Fax: [phone]   For all possible CPT codes, reference the Planned Interventions line above.     Check all conditions that are expected to impact treatment: {Conditions expected to impact treatment:Morbid obesity, Diabetes mellitus, and Musculoskeletal disorders   If treatment provided at initial evaluation, no treatment charged due to lack of authorization.

## 2023-03-28 ENCOUNTER — Ambulatory Visit: Admitting: Physical Therapy

## 2023-03-28 ENCOUNTER — Encounter: Payer: Self-pay | Admitting: Physical Therapy

## 2023-03-28 DIAGNOSIS — M25562 Pain in left knee: Secondary | ICD-10-CM

## 2023-03-28 DIAGNOSIS — M6281 Muscle weakness (generalized): Secondary | ICD-10-CM

## 2023-03-28 DIAGNOSIS — R262 Difficulty in walking, not elsewhere classified: Secondary | ICD-10-CM

## 2023-03-28 NOTE — Therapy (Addendum)
 OUTPATIENT PHYSICAL THERAPY LOWER EXTREMITY TREATMENT   Patient Name: Tina Bryant MRN: [id number] DOB:[birth year], 78 y.o., female Today's Date: 03/28/2023  END OF SESSION:  PT End of Session - 03/28/23 1308     Visit Number 4    Number of Visits 16    Date for PT Re-Evaluation 05/14/23    Authorization Type [employer]    Authorization Time Period 03/21/23-06/19/23    Authorization - Visit Number 3    Authorization - Number of Visits 13    PT Start Time 1313    PT Stop Time 1355    PT Time Calculation (min) 42 min              Past Medical History:  Diagnosis Date   Arthritis    Chronic kidney disease    CKD3   DVT (deep venous thrombosis) (HCC)    Hepatitis    b   Hypertension    Pneumonia    Pre-diabetes    Past Surgical History:  Procedure Laterality Date   EYE SURGERY Left    strabismus surgery   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   Procedure: TOTAL KNEE ARTHROPLASTY, OPEN REDUCTION INTERNAL FIXATION OF MEDIAL FEMORAL CONDYLE;  Surgeon: Jodi Geralds, MD;  Location: [facility];  Service: Orthopedics;  Laterality: Left;   Patient Active Problem List   Diagnosis Date Noted   Primary osteoarthritis of left knee 02/03/2023   DVT (deep venous thrombosis) (HCC) 11/01/2022   Fall at home, initial encounter 11/01/2022   CAP (community acquired pneumonia) 11/01/2022   Severe sepsis (HCC) 11/01/2022   AKI (acute kidney injury) (HCC) 11/01/2022   Acute metabolic encephalopathy 11/01/2022   CKD (chronic kidney disease) stage 3, GFR 30-59 ml/min (HCC) 11/01/2022   Elevated troponin 11/01/2022   Non-insulin treated type 2 diabetes mellitus (HCC) 11/01/2022   High anion gap metabolic acidosis 11/01/2022   Acute deep vein thrombosis (DVT) of femoral vein of left lower extremity (HCC) 06/08/2022   Hyperlipidemia 07/09/2006   Microcytic anemia 07/09/2006   DEPRESSION 07/09/2006   Essential hypertension 07/09/2006    PCP: Felix Pacini, FNP    REFERRING PROVIDER: Jodi Geralds, MD   REFERRING DIAG: Left TKA  THERAPY DIAG:  Acute pain of left knee  Difficulty in walking, not elsewhere classified  Muscle weakness (generalized)  Rationale for Evaluation and Treatment: Rehabilitation  ONSET DATE: 02-04-23 surgery L TKA had trouble for many years before surgery  SUBJECTIVE:   SUBJECTIVE STATEMENT: The knee is doing a lot better. 6/10.    EVAL -Had the surgery on February 04, 2023 of L TKA with Dr Luiz Blare. I have been doing exercises that I received from my HHPT.  I have been trying to walk a lot and I walk every day. I do not drive. I can stand about 5 minutes right now. I can sit as long as I want. I want to go back to church  PERTINENT HISTORY: Hx of DVT, hyperlipdemia, HTN, Hx of fall but not in last 6 months hepatitis PAIN:  Are you having pain? Yes: NPRS scale: 6/10 in left knee and at worst 10/10 Pain location: Left knee  Pain description: sometimes sharp going down to foot Aggravating factors: standing on leg for longer than 5 minutes, straightening the leg, swelling in left knee, I can't get in the tub without difficulty, mop floor and household chores, can't shop and carry groceries Relieving factors: not much right now Can't bend over to do grooming and cut  my toe nails PRECAUTIONS: Other: post left TKA  RED FLAGS: None   WEIGHT BEARING RESTRICTIONS: Yes WBAT L  FALLS:  Has patient fallen in last 6 months? No  LIVING ENVIRONMENT: Lives with: lives alone Lives in: House/apartment Stairs: Yes: External: 2 steps; on left going up Has following equipment at home: Single point cane, Walker - 2 wheeled, shower chair, and Grab bars  OCCUPATION: retired    used to work at group home  PLOF: Independent  PATIENT GOALS: I want to be able to walk with a cane and to go back to church.  NEXT MD VISIT: TBD  OBJECTIVE:  Note: Objective measures were completed at Evaluation unless otherwise noted.  DIAGNOSTIC  FINDINGS: see imaging  PATIENT SURVEYS:  LEFS 18/80 22.5%  COGNITION: Overall cognitive status: Within functional limits for tasks assessed     SENSATION: WFL  EDEMA:  Circumferential: R knee 44.0cm and Left knee is 48.25 cm  MUSCLE LENGTH: Hamstrings: Right 54 deg; Left 50 deg Thomas test: + bil  POSTURE: rounded shoulders, forward head, flexed trunk , and weight shift right  PALPATION: Well healing TKA L but has a wound opending on medial side of scar  LOWER EXTREMITY ROM:  Active ROM Right eval Left eval Left 03-21-23 Left 03/28/23  Hip flexion      Hip extension      Hip abduction      Hip adduction      Hip internal rotation      Hip external rotation      Knee flexion 133/P 138 99 P 109 A105 108 AA  Knee extension -5 -11 ext -10 ext   Ankle dorsiflexion      Ankle plantarflexion      Ankle inversion      Ankle eversion       (Blank rows = not tested)  LOWER EXTREMITY MMT:  MMT Right eval Left eval  Hip flexion 4 4-  Hip extension 4 4-  Hip abduction 3+ 3-  Hip adduction    Hip internal rotation    Hip external rotation    Knee flexion 4+ 3-  Knee extension 4 3-  Ankle dorsiflexion    Ankle plantarflexion    Ankle inversion    Ankle eversion     (Blank rows = not tested)  LOWER EXTREMITY SPECIAL TESTS:  NT due to surgery  FUNCTIONAL TESTS:  5 times sit to stand: unable to stand without using hands  30 sec with UE support  3 x 2 minute walk test: 158 ft (462 ft Norm) using rolling walker: 03/28/23: 179 feet   GAIT: Distance walked: 135ft 2 MWT Assistive device utilized: Walker - 2 wheeled Level of assistance: Modified independence Comments: Pt with antalgic gait with 2 wheeled walker  [facility] Adult PT Treatment:                                                DATE: 03/28/23 Therapeutic Exercise: LAQ 3# 10 x 3  Seated hamstring curl GTB x 10  Supine QS with heel prop Supine QS x 10  SAQ 5# 10 x 2   Therapeutic Activity: Nustep L 4 x 5  minutes UE/LE for activity tolerance  2 MWT 179 feet with RW  STS x 10 with UE assist  SLR 5 x 3  Heel slide AAROM with slide  board assist 10x 2 , added strap x 10     [facility] Adult PT Treatment:                                                DATE: 03/26/23 Therapeutic Exercise: Supine QS with heel prop  SAQ 10 x 3   Therapeutic Activity: Nustep L3 UE/LE for activity tolerance x 5 min STS 5 x 2 without UE from elevated mat  Seated Knee extension AROM 10 x 3 SLR AROM 5 x 3  Supine knee flexion AAROM 10 x 3     [facility] Adult PT Treatment:                                                DATE: 03-21-23 Therapeutic Exercise: LAQ on left 3 x 10 SAQ 2 x 10 over bolster on L SLR  L 6 x then 4 x, rest 90 sec then 5 x then 3 x to fatigue Heel  slides 2 x 8 Manual Therapy: AP grade III with pt actively flexion knee between sets Tibial ER with overpressure to promote knee extension combined with active quad set ( pt with weakened quad set PT with retrograde massage for edema from knee to groin  Therapeutic Activity:  Gait training with SPC.  Pt ambulates 200 ft with some dependence on PT with left UE. Pt with decreased dependence with increased footage but not ready to I utilize at home STS with UE support with PT 5 x and moderate encouragement from mat STS 2 x 5 with 2 min rest at sink and chair squat into chair SELF Care-  Education about DVT and importance of walking and movement for recovery.  Pt also given information about local medical supply companies                                                                                                                               TREATMENT DATE: eval and issue HEP    PATIENT EDUCATION:  Education details: POC explanation of findings, issue HEP  Person educated: Patient Education method: Explanation, Demonstration, Tactile cues, Verbal cues, and Handouts Education comprehension: verbalized understanding, returned demonstration, verbal  cues required, tactile cues required, and needs further education  HOME EXERCISE PROGRAM: Access Code: [email or web address]  updated 03-21-23 URL: [email or web address] Date: 03/21/2023 Prepared by: Garen Lah  Exercises - Sit to stand with sink support Movement snack  - 1 x daily - 7 x weekly - 3 sets - 10 reps - Supine Knee Extension Stretch on Towel Roll  - 1 x daily - 7 x weekly - 3 sets - 10 reps - 5 sec  hold - Supine Heel  Slide with Strap  - 1 x daily - 7 x weekly - 3 sets - 10 reps - Supine Short Arc Quad  - 1 x daily - 7 x weekly - 3 sets - 10 reps - Seated Hamstring Stretch  - 1-2 x daily - 7 x weekly - 1 sets - 3 reps - 30 sec hold - Long Arc Quad  - 1 x daily - 7 x weekly - 3 sets - 10 reps - Seated Knee Flexion AAROM  - 1 x daily - 7 x weekly - 3 sets - 10 reps  ASSESSMENT:  CLINICAL IMPRESSION:  Pt reports improvement however still 6/10. She is walking in mornings and evenings inside her home. 2 MWT improved to 179 feet   Pt enters clinic with 6/10 pain and reports that elevated her legs at home and it helped her edema. Began Nustep for activity tolerance which she did well with. Continued with STS and mat based LE ROM and strengthening.  Continues to demonstrate quad lag and fatigues quickly.    EVAL- Patient is a 78 y.o. female who was seen today for physical therapy evaluation and treatment for L TKA. Pt has hx of DVT on Eliquis , sedentary life style and decreased mobility.  Pt lives alone and would like to improve mobility in order to safely live on her own.  Pt would like to be able to safely enter and exit tub and perform household chores safely in home with adequate sleep.  Pt tend to be more sedentary and understands she needs to improve mobility in order to prenvent DVTS.  Pt will benefit from skilled PT to address impairments and maximize functional mobility  OBJECTIVE IMPAIRMENTS: Abnormal gait, decreased activity tolerance, decreased knowledge of use of  DME, decreased mobility, difficulty walking, decreased ROM, decreased strength, increased edema, postural dysfunction, obesity, and pain.   ACTIVITY LIMITATIONS: carrying, lifting, bending, standing, squatting, sleeping, stairs, transfers, hygiene/grooming, and locomotion level  PARTICIPATION LIMITATIONS: meal prep, cleaning, laundry, shopping, and church  PERSONAL FACTORS: Hx of DVT, hyperlipdemia, HTN, Hx of fall but not in last 6 months are also affecting patient's functional outcome. hepatitis  REHAB POTENTIAL: Good  CLINICAL DECISION MAKING: Evolving/moderate complexity  EVALUATION COMPLEXITY: Moderate   GOALS: Goals reviewed with patient? Yes  SHORT TERM GOALS: Target date: 04-09-23  Pt will be independent with initial HEP Baseline:no knowledge Goal status: INITIAL  2.  AROM of knee extension -10 to 100 flexion to increase mobility for transitional movements Baseline: eval left flexion 99 degrees Goal status: INITIAL  3.  Demonstrate and verbalize understanding of condition management including RICE, positioning, use of A.D., HEP Baseline: using 2 wheel walker at eval Goal status: INITIAL    LONG TERM GOALS: Target date: 05-14-23  Pt will be independent with advanced HEP Baseline: no knowledge Goal status: INITIAL  2.  Pt will be able demostrate 5 x STS in uncer 15 seconds to show improved LE strength. Baseline: Pt with heavy use of arms for 30 sec STS 3 x only Goal status: INITIAL  3.  Pt will be able to use LRAD in order to walk and return to church Baseline: using 2 wheeled walker  Goal status: INITIAL  4.  Pt will be educated and demonstrate ability to rise from ground to standing to encourage safety in home living alone Baseline: unable to perform floor to stand x fer Goal status: INITIAL  5.  Pt will improve her L knee flexion to  >/= 120  degrees and extension to </= 5 degrees with </= 2/10 pain for a more functional and efficient gait pattern Baseline:  See AROM chart Goal status: INITIAL  6.  Patient will report improved functional level on LEFS >/= 50/80 in order to allow for return to prior exercise level Baseline: eval 18/80 22.5 % Goal status: INITIAL   PLAN:  PT FREQUENCY: 2x/week  PT DURATION: 8 weeks  PLANNED INTERVENTIONS: 97164- PT Re-evaluation, 97110-Therapeutic exercises, 97530- Therapeutic activity, 97112- Neuromuscular re-education, 97535- Self Care, 24401- Manual therapy, [phone]- Gait training, [phone]- Electrical stimulation (manual), Patient/Family education, Balance training, Stair training, Taping, Dry Needling, Joint mobilization, DME instructions, Cryotherapy, and Moist heat  PLAN FOR NEXT SESSION: HEP, manual for increasing knee flex / ext  Jannette Spanner, PTA 03/28/23 2:03 PM Phone: [phone] Fax: [phone]   For all possible CPT codes, reference the Planned Interventions line above.     Check all conditions that are expected to impact treatment: {Conditions expected to impact treatment:Morbid obesity, Diabetes mellitus, and Musculoskeletal disorders   If treatment provided at initial evaluation, no treatment charged due to lack of authorization.

## 2023-04-02 ENCOUNTER — Encounter: Payer: Self-pay | Admitting: Physical Therapy

## 2023-04-02 ENCOUNTER — Ambulatory Visit: Admitting: Physical Therapy

## 2023-04-02 DIAGNOSIS — M25562 Pain in left knee: Secondary | ICD-10-CM | POA: Diagnosis not present

## 2023-04-02 DIAGNOSIS — R262 Difficulty in walking, not elsewhere classified: Secondary | ICD-10-CM

## 2023-04-02 NOTE — Therapy (Signed)
 OUTPATIENT PHYSICAL THERAPY LOWER EXTREMITY TREATMENT   Patient Name: Tina Bryant MRN: [id number] DOB:02-10-1945, 78 y.o., female Today's Date: 04/02/2023  END OF SESSION:  PT End of Session - 04/02/23 1300     Visit Number 5    Number of Visits 16    Date for PT Re-Evaluation 05/14/23    Authorization Type [employer] [employer]    Authorization Time Period 03/21/23-06/19/23    Authorization - Visit Number 4    Authorization - Number of Visits 13    PT Start Time 1258    PT Stop Time 1343    PT Time Calculation (min) 45 min              Past Medical History:  Diagnosis Date   Arthritis    Chronic kidney disease    CKD3   DVT (deep venous thrombosis) (HCC)    Hepatitis    b   Hypertension    Pneumonia    Pre-diabetes    Past Surgical History:  Procedure Laterality Date   EYE SURGERY Left    strabismus surgery   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   Procedure: TOTAL KNEE ARTHROPLASTY, OPEN REDUCTION INTERNAL FIXATION OF MEDIAL FEMORAL CONDYLE;  Surgeon: Jodi Geralds, MD;  Location: [facility];  Service: Orthopedics;  Laterality: Left;   Patient Active Problem List   Diagnosis Date Noted   Primary osteoarthritis of left knee 02/03/2023   DVT (deep venous thrombosis) (HCC) 11/01/2022   Fall at home, initial encounter 11/01/2022   CAP (community acquired pneumonia) 11/01/2022   Severe sepsis (HCC) 11/01/2022   AKI (acute kidney injury) (HCC) 11/01/2022   Acute metabolic encephalopathy 11/01/2022   CKD (chronic kidney disease) stage 3, GFR 30-59 ml/min (HCC) 11/01/2022   Elevated troponin 11/01/2022   Non-insulin treated type 2 diabetes mellitus (HCC) 11/01/2022   High anion gap metabolic acidosis 11/01/2022   Acute deep vein thrombosis (DVT) of femoral vein of left lower extremity (HCC) 06/08/2022   Hyperlipidemia 07/09/2006   Microcytic anemia 07/09/2006   DEPRESSION 07/09/2006   Essential hypertension 07/09/2006    PCP: Felix Pacini, FNP    REFERRING PROVIDER: Jodi Geralds, MD   REFERRING DIAG: Left TKA  THERAPY DIAG:  Acute pain of left knee  Difficulty in walking, not elsewhere classified  Rationale for Evaluation and Treatment: Rehabilitation  ONSET DATE: 02-04-23 surgery L TKA had trouble for many years before surgery  SUBJECTIVE:   SUBJECTIVE STATEMENT: Pt continues to report improvement 5/10 Left knee.    EVAL -Had the surgery on February 04, 2023 of L TKA with Dr Luiz Blare. I have been doing exercises that I received from my HHPT.  I have been trying to walk a lot and I walk every day. I do not drive. I can stand about 5 minutes right now. I can sit as long as I want. I want to go back to church  PERTINENT HISTORY: Hx of DVT, hyperlipdemia, HTN, Hx of fall but not in last 6 months hepatitis PAIN:  Are you having pain? Yes: NPRS scale: 5/10 in left knee and at worst 10/10 Pain location: Left knee  Pain description: sometimes sharp going down to foot Aggravating factors: standing on leg for longer than 5 minutes, straightening the leg, swelling in left knee, I can't get in the tub without difficulty, mop floor and household chores, can't shop and carry groceries Relieving factors: not much right now Can't bend over to do grooming and cut my toe nails PRECAUTIONS:  Other: post left TKA  RED FLAGS: None   WEIGHT BEARING RESTRICTIONS: Yes WBAT L  FALLS:  Has patient fallen in last 6 months? No  LIVING ENVIRONMENT: Lives with: lives alone Lives in: House/apartment Stairs: Yes: External: 2 steps; on left going up Has following equipment at home: Single point cane, Walker - 2 wheeled, shower chair, and Grab bars  OCCUPATION: retired    used to work at group home  PLOF: Independent  PATIENT GOALS: I want to be able to walk with a cane and to go back to church.  NEXT MD VISIT: TBD  OBJECTIVE:  Note: Objective measures were completed at Evaluation unless otherwise noted.  DIAGNOSTIC FINDINGS: see  imaging  PATIENT SURVEYS:  LEFS 18/80 22.5%  COGNITION: Overall cognitive status: Within functional limits for tasks assessed     SENSATION: WFL  EDEMA:  Circumferential: R knee 44.0cm and Left knee is 48.25 cm  MUSCLE LENGTH: Hamstrings: Right 54 deg; Left 50 deg Thomas test: + bil  POSTURE: rounded shoulders, forward head, flexed trunk , and weight shift right  PALPATION: Well healing TKA L but has a wound opending on medial side of scar  LOWER EXTREMITY ROM:  Active ROM Right eval Left eval Left 03-21-23 Left 03/28/23 Left 04/02/23  Hip flexion       Hip extension       Hip abduction       Hip adduction       Hip internal rotation       Hip external rotation       Knee flexion 133/P 138 99 P 109 A105 108 AA 111 AA  Knee extension -5 -11 ext -10 ext    Ankle dorsiflexion       Ankle plantarflexion       Ankle inversion       Ankle eversion        (Blank rows = not tested)  LOWER EXTREMITY MMT:  MMT Right eval Left eval  Hip flexion 4 4-  Hip extension 4 4-  Hip abduction 3+ 3-  Hip adduction    Hip internal rotation    Hip external rotation    Knee flexion 4+ 3-  Knee extension 4 3-  Ankle dorsiflexion    Ankle plantarflexion    Ankle inversion    Ankle eversion     (Blank rows = not tested)  LOWER EXTREMITY SPECIAL TESTS:  NT due to surgery  FUNCTIONAL TESTS:  5 times sit to stand: unable to stand without using hands  30 sec with UE support  3 x 2 minute walk test: 158 ft (462 ft Norm) using rolling walker: 03/28/23: 179 feet   GAIT: Distance walked: 174ft 2 MWT Assistive device utilized: Walker - 2 wheeled Level of assistance: Modified independence Comments: Pt with antalgic gait with 2 wheeled walker  [facility] Adult PT Treatment:                                                DATE: 04/02/23 Therapeutic Exercise: LAQ 3# 10 x 3  Seated hip flexion 10 x 2 AROM Seated hamstring curl GTB x 10  Supine QS with heel prop SAQ 5# 10 x 3 Hip flexor  stretch EOM  Therapeutic Activity: Nustep L 4 x 6 minutes UE/LE for activity tolerance  Standing Bilat heel raise 10 x 2  STS x 10 with UE assist to rise SLR 10 x 2 AROM  Heel slide AAROM with slide board assist 10x 2 , added strap x 10    [facility] Adult PT Treatment:                                                DATE: 03/28/23 Therapeutic Exercise: LAQ 3# 10 x 3  Seated hamstring curl GTB x 10  Supine QS with heel prop Supine QS x 10  SAQ 5# 10 x 2   Therapeutic Activity: Nustep L 4 x 5 minutes UE/LE for activity tolerance  2 MWT 179 feet with RW  STS x 10 with UE assist  SLR 5 x 3  Heel slide AAROM with slide board assist 10x 2 , added strap x 10     [facility] Adult PT Treatment:                                                DATE: 03/26/23 Therapeutic Exercise: Supine QS with heel prop  SAQ 10 x 3   Therapeutic Activity: Nustep L3 UE/LE for activity tolerance x 5 min STS 5 x 2 without UE from elevated mat  Seated Knee extension AROM 10 x 3 SLR AROM 5 x 3  Supine knee flexion AAROM 10 x 3     [facility] Adult PT Treatment:                                                DATE: 03-21-23 Therapeutic Exercise: LAQ on left 3 x 10 SAQ 2 x 10 over bolster on L SLR  L 6 x then 4 x, rest 90 sec then 5 x then 3 x to fatigue Heel  slides 2 x 8 Manual Therapy: AP grade III with pt actively flexion knee between sets Tibial ER with overpressure to promote knee extension combined with active quad set ( pt with weakened quad set PT with retrograde massage for edema from knee to groin  Therapeutic Activity:  Gait training with SPC.  Pt ambulates 200 ft with some dependence on PT with left UE. Pt with decreased dependence with increased footage but not ready to I utilize at home STS with UE support with PT 5 x and moderate encouragement from mat STS 2 x 5 with 2 min rest at sink and chair squat into chair SELF Care-  Education about DVT and importance of walking and movement for recovery.  Pt  also given information about local medical supply companies  TREATMENT DATE: eval and issue HEP    PATIENT EDUCATION:  Education details: POC explanation of findings, issue HEP  Person educated: Patient Education method: Explanation, Demonstration, Tactile cues, Verbal cues, and Handouts Education comprehension: verbalized understanding, returned demonstration, verbal cues required, tactile cues required, and needs further education  HOME EXERCISE PROGRAM: Access Code: [email or web address]  updated 03-21-23 URL: [email or web address] Date: 03/21/2023 Prepared by: Garen Lah  Exercises - Sit to stand with sink support Movement snack  - 1 x daily - 7 x weekly - 3 sets - 10 reps - Supine Knee Extension Stretch on Towel Roll  - 1 x daily - 7 x weekly - 3 sets - 10 reps - 5 sec  hold - Supine Heel Slide with Strap  - 1 x daily - 7 x weekly - 3 sets - 10 reps - Supine Short Arc Quad  - 1 x daily - 7 x weekly - 3 sets - 10 reps - Seated Hamstring Stretch  - 1-2 x daily - 7 x weekly - 1 sets - 3 reps - 30 sec hold - Long Arc Quad  - 1 x daily - 7 x weekly - 3 sets - 10 reps - Seated Knee Flexion AAROM  - 1 x daily - 7 x weekly - 3 sets - 10 reps  ASSESSMENT:  CLINICAL IMPRESSION:  Pt reports improvement however still 5/10. She is walking in mornings and evenings inside her home. Able to complete 10 reps of SLR today but with quad lag. Added hip flexor stretch with pt demonstrating improved knee flexion     EVAL- Patient is a 78 y.o. female who was seen today for physical therapy evaluation and treatment for L TKA. Pt has hx of DVT on Eliquis , sedentary life style and decreased mobility.  Pt lives alone and would like to improve mobility in order to safely live on her own.  Pt would like to be able to safely enter and exit tub and perform household chores  safely in home with adequate sleep.  Pt tend to be more sedentary and understands she needs to improve mobility in order to prenvent DVTS.  Pt will benefit from skilled PT to address impairments and maximize functional mobility  OBJECTIVE IMPAIRMENTS: Abnormal gait, decreased activity tolerance, decreased knowledge of use of DME, decreased mobility, difficulty walking, decreased ROM, decreased strength, increased edema, postural dysfunction, obesity, and pain.   ACTIVITY LIMITATIONS: carrying, lifting, bending, standing, squatting, sleeping, stairs, transfers, hygiene/grooming, and locomotion level  PARTICIPATION LIMITATIONS: meal prep, cleaning, laundry, shopping, and church  PERSONAL FACTORS: Hx of DVT, hyperlipdemia, HTN, Hx of fall but not in last 6 months are also affecting patient's functional outcome. hepatitis  REHAB POTENTIAL: Good  CLINICAL DECISION MAKING: Evolving/moderate complexity  EVALUATION COMPLEXITY: Moderate   GOALS: Goals reviewed with patient? Yes  SHORT TERM GOALS: Target date: 04-09-23  Pt will be independent with initial HEP Baseline:no knowledge Goal status: INITIAL  2.  AROM of knee extension -10 to 100 flexion to increase mobility for transitional movements Baseline: eval left flexion 99 degrees Goal status: INITIAL  3.  Demonstrate and verbalize understanding of condition management including RICE, positioning, use of A.D., HEP Baseline: using 2 wheel walker at eval Goal status: INITIAL    LONG TERM GOALS: Target date: 05-14-23  Pt will be independent with advanced HEP Baseline: no knowledge Goal status: INITIAL  2.  Pt will be able demostrate 5 x STS in uncer 15 seconds to show improved LE strength. Baseline:  Pt with heavy use of arms for 30 sec STS 3 x only Goal status: INITIAL  3.  Pt will be able to use LRAD in order to walk and return to church Baseline: using 2 wheeled walker  Goal status: INITIAL  4.  Pt will be educated and  demonstrate ability to rise from ground to standing to encourage safety in home living alone Baseline: unable to perform floor to stand x fer Goal status: INITIAL  5.  Pt will improve her L knee flexion to  >/= 120 degrees and extension to </= 5 degrees with </= 2/10 pain for a more functional and efficient gait pattern Baseline: See AROM chart Goal status: INITIAL  6.  Patient will report improved functional level on LEFS >/= 50/80 in order to allow for return to prior exercise level Baseline: eval 18/80 22.5 % Goal status: INITIAL   PLAN:  PT FREQUENCY: 2x/week  PT DURATION: 8 weeks  PLANNED INTERVENTIONS: 97164- PT Re-evaluation, 97110-Therapeutic exercises, 97530- Therapeutic activity, 97112- Neuromuscular re-education, 97535- Self Care, 16109- Manual therapy, [phone]- Gait training, [phone]- Electrical stimulation (manual), Patient/Family education, Balance training, Stair training, Taping, Dry Needling, Joint mobilization, DME instructions, Cryotherapy, and Moist heat  PLAN FOR NEXT SESSION: HEP, manual for increasing knee flex / ext  Jannette Spanner, PTA 04/02/23 1:45 PM Phone: [phone] Fax: [phone]   For all possible CPT codes, reference the Planned Interventions line above.     Check all conditions that are expected to impact treatment: {Conditions expected to impact treatment:Morbid obesity, Diabetes mellitus, and Musculoskeletal disorders   If treatment provided at initial evaluation, no treatment charged due to lack of authorization.

## 2023-04-04 ENCOUNTER — Ambulatory Visit: Admitting: Physical Therapy

## 2023-04-10 ENCOUNTER — Ambulatory Visit: Attending: Orthopedic Surgery | Admitting: Physical Therapy

## 2023-04-10 ENCOUNTER — Telehealth: Payer: Self-pay | Admitting: Physical Therapy

## 2023-04-10 DIAGNOSIS — R262 Difficulty in walking, not elsewhere classified: Secondary | ICD-10-CM | POA: Insufficient documentation

## 2023-04-10 DIAGNOSIS — G8929 Other chronic pain: Secondary | ICD-10-CM | POA: Insufficient documentation

## 2023-04-10 DIAGNOSIS — M6281 Muscle weakness (generalized): Secondary | ICD-10-CM | POA: Insufficient documentation

## 2023-04-10 DIAGNOSIS — M25562 Pain in left knee: Secondary | ICD-10-CM | POA: Insufficient documentation

## 2023-04-10 DIAGNOSIS — M25511 Pain in right shoulder: Secondary | ICD-10-CM | POA: Insufficient documentation

## 2023-04-10 NOTE — Telephone Encounter (Signed)
 Contacted patient regarding no show. She did not realize she was scheduled today. She was unable to reschedule due to needing to arrange transportation. Confirmed her next appointment Friday 04/12/23.

## 2023-04-12 ENCOUNTER — Encounter: Payer: Self-pay | Admitting: Physical Therapy

## 2023-04-12 ENCOUNTER — Ambulatory Visit: Admitting: Physical Therapy

## 2023-04-12 DIAGNOSIS — M25562 Pain in left knee: Secondary | ICD-10-CM

## 2023-04-12 DIAGNOSIS — R262 Difficulty in walking, not elsewhere classified: Secondary | ICD-10-CM

## 2023-04-12 DIAGNOSIS — M25511 Pain in right shoulder: Secondary | ICD-10-CM | POA: Diagnosis present

## 2023-04-12 DIAGNOSIS — G8929 Other chronic pain: Secondary | ICD-10-CM | POA: Diagnosis present

## 2023-04-12 DIAGNOSIS — M6281 Muscle weakness (generalized): Secondary | ICD-10-CM | POA: Diagnosis present

## 2023-04-12 NOTE — Therapy (Signed)
 OUTPATIENT PHYSICAL THERAPY LOWER EXTREMITY TREATMENT   Patient Name: Tina Bryant MRN: [id number] DOB:[birth year], 78 y.o., female Today's Date: 04/12/2023  END OF SESSION:  PT End of Session - 04/12/23 1058     Visit Number 6    Number of Visits 16    Date for PT Re-Evaluation 05/14/23    Authorization Type [place] [employer]    Authorization Time Period 03/21/23-06/19/23    Authorization - Visit Number 5    Authorization - Number of Visits 13    PT Start Time 1100    PT Stop Time 1145    PT Time Calculation (min) 45 min              Past Medical History:  Diagnosis Date   Arthritis    Chronic kidney disease    CKD3   DVT (deep venous thrombosis) (HCC)    Hepatitis    b   Hypertension    Pneumonia    Pre-diabetes    Past Surgical History:  Procedure Laterality Date   EYE SURGERY Left    strabismus surgery   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   Procedure: TOTAL KNEE ARTHROPLASTY, OPEN REDUCTION INTERNAL FIXATION OF MEDIAL FEMORAL CONDYLE;  Surgeon: Jodi Geralds, MD;  Location: [facility];  Service: Orthopedics;  Laterality: Left;   Patient Active Problem List   Diagnosis Date Noted   Primary osteoarthritis of left knee 02/03/2023   DVT (deep venous thrombosis) (HCC) 11/01/2022   Fall at home, initial encounter 11/01/2022   CAP (community acquired pneumonia) 11/01/2022   Severe sepsis (HCC) 11/01/2022   AKI (acute kidney injury) (HCC) 11/01/2022   Acute metabolic encephalopathy 11/01/2022   CKD (chronic kidney disease) stage 3, GFR 30-59 ml/min (HCC) 11/01/2022   Elevated troponin 11/01/2022   Non-insulin treated type 2 diabetes mellitus (HCC) 11/01/2022   High anion gap metabolic acidosis 11/01/2022   Acute deep vein thrombosis (DVT) of femoral vein of left lower extremity (HCC) 06/08/2022   Hyperlipidemia 07/09/2006   Microcytic anemia 07/09/2006   DEPRESSION 07/09/2006   Essential hypertension 07/09/2006    PCP: Felix Pacini, FNP    REFERRING PROVIDER: Jodi Geralds, MD   REFERRING DIAG: Left TKA  THERAPY DIAG:  Acute pain of left knee  Difficulty in walking, not elsewhere classified  Rationale for Evaluation and Treatment: Rehabilitation  ONSET DATE: 02-04-23 surgery L TKA had trouble for many years before surgery  SUBJECTIVE:   SUBJECTIVE STATEMENT: Pt continues to report improvement 4/10 Left knee with intermittent sharp pains while sitting.    EVAL -Had the surgery on February 04, 2023 of L TKA with Dr Luiz Blare. I have been doing exercises that I received from my HHPT.  I have been trying to walk a lot and I walk every day. I do not drive. I can stand about 5 minutes right now. I can sit as long as I want. I want to go back to church  PERTINENT HISTORY: Hx of DVT, hyperlipdemia, HTN, Hx of fall but not in last 6 months hepatitis PAIN:  Are you having pain? Yes: NPRS scale: 4/10 in left knee and at worst 10/10 Pain location: Left knee  Pain description: sometimes sharp going down to foot Aggravating factors: standing on leg for longer than 5 minutes, straightening the leg, swelling in left knee, I can't get in the tub without difficulty, mop floor and household chores, can't shop and carry groceries Relieving factors: not much right now Can't bend over to do grooming  and cut my toe nails PRECAUTIONS: Other: post left TKA  RED FLAGS: None   WEIGHT BEARING RESTRICTIONS: Yes WBAT L  FALLS:  Has patient fallen in last 6 months? No  LIVING ENVIRONMENT: Lives with: lives alone Lives in: House/apartment Stairs: Yes: External: 2 steps; on left going up Has following equipment at home: Single point cane, Walker - 2 wheeled, shower chair, and Grab bars  OCCUPATION: retired    used to work at group home  PLOF: Independent  PATIENT GOALS: I want to be able to walk with a cane and to go back to church.  NEXT MD VISIT: TBD  OBJECTIVE:  Note: Objective measures were completed at Evaluation unless  otherwise noted.  DIAGNOSTIC FINDINGS: see imaging  PATIENT SURVEYS:  LEFS 18/80 22.5%  COGNITION: Overall cognitive status: Within functional limits for tasks assessed     SENSATION: WFL  EDEMA:  Circumferential: R knee 44.0cm and Left knee is 48.25 cm  MUSCLE LENGTH: Hamstrings: Right 54 deg; Left 50 deg Thomas test: + bil  POSTURE: rounded shoulders, forward head, flexed trunk , and weight shift right  PALPATION: Well healing TKA L but has a wound opending on medial side of scar  LOWER EXTREMITY ROM:  Active ROM Right eval Left eval Left 03-21-23 Left 03/28/23 Left 04/02/23 Left 04/12/23  Hip flexion        Hip extension        Hip abduction        Hip adduction        Hip internal rotation        Hip external rotation        Knee flexion 133/P 138 99 P 109 A105 108 AA 111 AA 114 AA  Knee extension -5 -11 ext -10 ext     Ankle dorsiflexion        Ankle plantarflexion        Ankle inversion        Ankle eversion         (Blank rows = not tested)  LOWER EXTREMITY MMT:  MMT Right eval Left eval  Hip flexion 4 4-  Hip extension 4 4-  Hip abduction 3+ 3-  Hip adduction    Hip internal rotation    Hip external rotation    Knee flexion 4+ 3-  Knee extension 4 3-  Ankle dorsiflexion    Ankle plantarflexion    Ankle inversion    Ankle eversion     (Blank rows = not tested)  LOWER EXTREMITY SPECIAL TESTS:  NT due to surgery  FUNCTIONAL TESTS:  5 times sit to stand: unable to stand without using hands  30 sec with UE support  3 x 2 minute walk test: 158 ft (462 ft Norm) using rolling walker: 03/28/23: 179 feet   GAIT: Distance walked: 148ft 2 MWT Assistive device utilized: Walker - 2 wheeled Level of assistance: Modified independence Comments: Pt with antalgic gait with 2 wheeled walker  [facility] Adult PT Treatment:                                                DATE: 04/12/23 Therapeutic Exercise: LAQ 5# 10 x 3  Seated hip flexion 10 x 2 AROM Seated  hamstring curl GTB 2 x 10  Supine QS with heel prop SAQ 5# 10 x 3 Hip flexor stretch EOM  Seated h/s stretch, foot on stool   Therapeutic Activity: Nustep L 4 x 6 minutes UE/LE for activity tolerance  Bilat heel raise 10 x 2  4 inch step up x 10 bil UE support Supported Squat x 10 STS x 10 with UE assist to rise SLR 10 x 2 AROM  Heel slide AAROM with slide board assist 10x 2 , added strap x 10    [facility] Adult PT Treatment:                                                DATE: 04/02/23 Therapeutic Exercise: LAQ 3# 10 x 3  Seated hip flexion 10 x 2 AROM Seated hamstring curl GTB x 10  Supine QS with heel prop SAQ 5# 10 x 3 Hip flexor stretch EOM  Therapeutic Activity: Nustep L 4 x 6 minutes UE/LE for activity tolerance  Standing Bilat heel raise 10 x 2  STS x 10 with UE assist to rise SLR 10 x 2 AROM  Heel slide AAROM with slide board assist 10x 2 , added strap x 10    [facility] Adult PT Treatment:                                                DATE: 03/28/23 Therapeutic Exercise: LAQ 3# 10 x 3  Seated hamstring curl GTB x 10  Supine QS with heel prop Supine QS x 10  SAQ 5# 10 x 2   Therapeutic Activity: Nustep L 4 x 5 minutes UE/LE for activity tolerance  2 MWT 179 feet with RW  STS x 10 with UE assist  SLR 5 x 3  Heel slide AAROM with slide board assist 10x 2 , added strap x 10     [facility] Adult PT Treatment:                                                DATE: 03/26/23 Therapeutic Exercise: Supine QS with heel prop  SAQ 10 x 3   Therapeutic Activity: Nustep L3 UE/LE for activity tolerance x 5 min STS 5 x 2 without UE from elevated mat  Seated Knee extension AROM 10 x 3 SLR AROM 5 x 3  Supine knee flexion AAROM 10 x 3     [facility] Adult PT Treatment:                                                DATE: 03-21-23 Therapeutic Exercise: LAQ on left 3 x 10 SAQ 2 x 10 over bolster on L SLR  L 6 x then 4 x, rest 90 sec then 5 x then 3 x to fatigue Heel  slides 2 x 8 Manual  Therapy: AP grade III with pt actively flexion knee between sets Tibial ER with overpressure to promote knee extension combined with active quad set ( pt with weakened quad set PT with retrograde massage for edema from knee to groin  Therapeutic Activity:  Gait training with SPC.  Pt ambulates 200 ft with some dependence on PT with left UE. Pt with decreased dependence with increased footage but not ready to I utilize at home STS with UE support with PT 5 x and moderate encouragement from mat STS 2 x 5 with 2 min rest at sink and chair squat into chair SELF Care-  Education about DVT and importance of walking and movement for recovery.  Pt also given information about local medical supply companies                                                                                                                               TREATMENT DATE: eval and issue HEP    PATIENT EDUCATION:  Education details: POC explanation of findings, issue HEP  Person educated: Patient Education method: Explanation, Demonstration, Tactile cues, Verbal cues, and Handouts Education comprehension: verbalized understanding, returned demonstration, verbal cues required, tactile cues required, and needs further education  HOME EXERCISE PROGRAM: Access Code: [email or web address]  updated 03-21-23 URL: [email or web address] Date: 03/21/2023 Prepared by: Garen Lah  Exercises - Sit to stand with sink support Movement snack  - 1 x daily - 7 x weekly - 3 sets - 10 reps - Supine Knee Extension Stretch on Towel Roll  - 1 x daily - 7 x weekly - 3 sets - 10 reps - 5 sec  hold - Supine Heel Slide with Strap  - 1 x daily - 7 x weekly - 3 sets - 10 reps - Supine Short Arc Quad  - 1 x daily - 7 x weekly - 3 sets - 10 reps - Seated Hamstring Stretch  - 1-2 x daily - 7 x weekly - 1 sets - 3 reps - 30 sec hold - Long Arc Quad  - 1 x daily - 7 x weekly - 3 sets - 10 reps - Seated Knee Flexion AAROM  - 1 x daily - 7 x  weekly - 3 sets - 10 reps  ASSESSMENT:  CLINICAL IMPRESSION:  Pt reports improvement however still 4/10 pain. Able to progress closed chain today to 4 inch step up with good tolerance. AAROM flexion 114. She was fatigued at end of session. Declined ice pack. She performs parts of her HEP and continues to walk indoors with her RW. She is progressing toward STG and LTG.     EVAL- Patient is a 78 y.o. female who was seen today for physical therapy evaluation and treatment for L TKA. Pt has hx of DVT on Eliquis , sedentary life style and decreased mobility.  Pt lives alone and would like to improve mobility in order to safely live on her own.  Pt would like to be able to safely enter and exit tub and perform household chores safely in home with adequate sleep.  Pt tend to be more sedentary and understands she needs to improve mobility in order to prenvent DVTS.  Pt will benefit from  skilled PT to address impairments and maximize functional mobility  OBJECTIVE IMPAIRMENTS: Abnormal gait, decreased activity tolerance, decreased knowledge of use of DME, decreased mobility, difficulty walking, decreased ROM, decreased strength, increased edema, postural dysfunction, obesity, and pain.   ACTIVITY LIMITATIONS: carrying, lifting, bending, standing, squatting, sleeping, stairs, transfers, hygiene/grooming, and locomotion level  PARTICIPATION LIMITATIONS: meal prep, cleaning, laundry, shopping, and church  PERSONAL FACTORS: Hx of DVT, hyperlipdemia, HTN, Hx of fall but not in last 6 months are also affecting patient's functional outcome. hepatitis  REHAB POTENTIAL: Good  CLINICAL DECISION MAKING: Evolving/moderate complexity  EVALUATION COMPLEXITY: Moderate   GOALS: Goals reviewed with patient? Yes  SHORT TERM GOALS: Target date: 04-09-23  Pt will be independent with initial HEP Baseline:no knowledge Goal status: PARTIALLY  MET  2.  AROM of knee extension -10 to 100 flexion to increase mobility  for transitional movements Baseline: eval left flexion 99 degrees 04/12/23: AAROM 114 Goal status: ONGOING  3.  Demonstrate and verbalize understanding of condition management including RICE, positioning, use of A.D., HEP Baseline: using 2 wheel walker at eval 04/12/23: using elevation Goal status: MET    LONG TERM GOALS: Target date: 05-14-23  Pt will be independent with advanced HEP Baseline: no knowledge Goal status: INITIAL  2.  Pt will be able demostrate 5 x STS in uncer 15 seconds to show improved LE strength. Baseline: Pt with heavy use of arms for 30 sec STS 3 x only Goal status: INITIAL  3.  Pt will be able to use LRAD in order to walk and return to church Baseline: using 2 wheeled walker  Goal status: INITIAL  4.  Pt will be educated and demonstrate ability to rise from ground to standing to encourage safety in home living alone Baseline: unable to perform floor to stand x fer Goal status: INITIAL  5.  Pt will improve her L knee flexion to  >/= 120 degrees and extension to </= 5 degrees with </= 2/10 pain for a more functional and efficient gait pattern Baseline: See AROM chart Goal status: INITIAL  6.  Patient will report improved functional level on LEFS >/= 50/80 in order to allow for return to prior exercise level Baseline: eval 18/80 22.5 % Goal status: INITIAL   PLAN:  PT FREQUENCY: 2x/week  PT DURATION: 8 weeks  PLANNED INTERVENTIONS: 97164- PT Re-evaluation, 97110-Therapeutic exercises, 97530- Therapeutic activity, 97112- Neuromuscular re-education, 97535- Self Care, 16109- Manual therapy, [phone]- Gait training, [phone]- Electrical stimulation (manual), Patient/Family education, Balance training, Stair training, Taping, Dry Needling, Joint mobilization, DME instructions, Cryotherapy, and Moist heat  PLAN FOR NEXT SESSION: HEP, manual for increasing knee flex / ext  Jannette Spanner, PTA 04/12/23 11:57 AM Phone: [phone] Fax: [phone]   For all  possible CPT codes, reference the Planned Interventions line above.     Check all conditions that are expected to impact treatment: {Conditions expected to impact treatment:Morbid obesity, Diabetes mellitus, and Musculoskeletal disorders   If treatment provided at initial evaluation, no treatment charged due to lack of authorization.

## 2023-04-16 ENCOUNTER — Telehealth: Payer: Self-pay | Admitting: Physical Therapy

## 2023-04-16 ENCOUNTER — Ambulatory Visit: Admitting: Physical Therapy

## 2023-04-16 NOTE — Telephone Encounter (Signed)
 Spoke with patient regarding missed appointment. She reports that she forgot about her appointment. Reminded her of next appointment and reminded of attendance policy.

## 2023-04-18 ENCOUNTER — Ambulatory Visit: Admitting: Physical Therapy

## 2023-04-18 ENCOUNTER — Encounter: Payer: Self-pay | Admitting: Physical Therapy

## 2023-04-18 DIAGNOSIS — R262 Difficulty in walking, not elsewhere classified: Secondary | ICD-10-CM

## 2023-04-18 DIAGNOSIS — M6281 Muscle weakness (generalized): Secondary | ICD-10-CM

## 2023-04-18 DIAGNOSIS — M25562 Pain in left knee: Secondary | ICD-10-CM

## 2023-04-18 NOTE — Therapy (Signed)
 OUTPATIENT PHYSICAL THERAPY LOWER EXTREMITY TREATMENT   Patient Name: Tina Bryant MRN: [id number] DOB:14-Apr-1945, 78 y.o., female Today's Date: 04/18/2023  END OF SESSION:  PT End of Session - 04/18/23 1446     Visit Number 7    Number of Visits 16    Date for PT Re-Evaluation 05/14/23    Authorization Type Tina Bryant [employer]    Authorization Time Period 03/21/23-06/19/23    Authorization - Visit Number 6    Authorization - Number of Visits 13    PT Start Time 0245    PT Stop Time 0330    PT Time Calculation (min) 45 min              Past Medical History:  Diagnosis Date   Arthritis    Chronic kidney disease    CKD3   DVT (deep venous thrombosis) (HCC)    Hepatitis    b   Hypertension    Pneumonia    Pre-diabetes    Past Surgical History:  Procedure Laterality Date   EYE SURGERY Left    strabismus surgery   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   Procedure: TOTAL KNEE ARTHROPLASTY, OPEN REDUCTION INTERNAL FIXATION OF MEDIAL FEMORAL CONDYLE;  Surgeon: Jodi Geralds, MD;  Location: [facility];  Service: Orthopedics;  Laterality: Left;   Patient Active Problem List   Diagnosis Date Noted   Primary osteoarthritis of left knee 02/03/2023   DVT (deep venous thrombosis) (HCC) 11/01/2022   Fall at home, initial encounter 11/01/2022   CAP (community acquired pneumonia) 11/01/2022   Severe sepsis (HCC) 11/01/2022   AKI (acute kidney injury) (HCC) 11/01/2022   Acute metabolic encephalopathy 11/01/2022   CKD (chronic kidney disease) stage 3, GFR 30-59 ml/min (HCC) 11/01/2022   Elevated troponin 11/01/2022   Non-insulin treated type 2 diabetes mellitus (HCC) 11/01/2022   High anion gap metabolic acidosis 11/01/2022   Acute deep vein thrombosis (DVT) of femoral vein of left lower extremity (HCC) 06/08/2022   Hyperlipidemia 07/09/2006   Microcytic anemia 07/09/2006   DEPRESSION 07/09/2006   Essential hypertension 07/09/2006    PCP: Felix Pacini, FNP    REFERRING PROVIDER: Jodi Geralds, MD   REFERRING DIAG: Left TKA  THERAPY DIAG:  Acute pain of left knee  Difficulty in walking, not elsewhere classified  Muscle weakness (generalized)  Rationale for Evaluation and Treatment: Rehabilitation  ONSET DATE: 02-04-23 surgery L TKA had trouble for many years before surgery  SUBJECTIVE:   SUBJECTIVE STATEMENT: Pt continues to report improvement 0/10 Left knee with intermittent sharp pains while sitting.    EVAL -Had the surgery on February 04, 2023 of L TKA with Dr Luiz Blare. I have been doing exercises that I received from my HHPT.  I have been trying to walk a lot and I walk every day. I do not drive. I can stand about 5 minutes right now. I can sit as long as I want. I want to go back to church  PERTINENT HISTORY: Hx of DVT, hyperlipdemia, HTN, Hx of fall but not in last 6 months hepatitis PAIN:  Are you having pain? Yes: NPRS scale: 0/10 in left knee and at worst 10/10 Pain location: Left knee  Pain description: sometimes sharp going down to foot Aggravating factors: standing on leg for longer than 5 minutes, straightening the leg, swelling in left knee, I can't get in the tub without difficulty, mop floor and household chores, can't shop and carry groceries Relieving factors: not much right now Can't bend  over to do grooming and cut my toe nails PRECAUTIONS: Other: post left TKA  RED FLAGS: None   WEIGHT BEARING RESTRICTIONS: Yes WBAT L  FALLS:  Has patient fallen in last 6 months? No  LIVING ENVIRONMENT: Lives with: lives alone Lives in: House/apartment Stairs: Yes: External: 2 steps; on left going up Has following equipment at home: Single point cane, Walker - 2 wheeled, shower chair, and Grab bars  OCCUPATION: retired    used to work at group home  PLOF: Independent  PATIENT GOALS: I want to be able to walk with a cane and to go back to church.  NEXT MD VISIT: TBD  OBJECTIVE:  Note: Objective measures were  completed at Evaluation unless otherwise noted.  DIAGNOSTIC FINDINGS: see imaging  PATIENT SURVEYS:  LEFS 18/80 22.5%  COGNITION: Overall cognitive status: Within functional limits for tasks assessed     SENSATION: WFL  EDEMA:  Circumferential: R knee 44.0cm and Left knee is 48.25 cm  MUSCLE LENGTH: Hamstrings: Right 54 deg; Left 50 deg Thomas test: + bil  POSTURE: rounded shoulders, forward head, flexed trunk , and weight shift right  PALPATION: Well healing TKA L but has a wound opending on medial side of scar  LOWER EXTREMITY ROM:  Active ROM Right eval Left eval Left 03-21-23 Left 03/28/23 Left 04/02/23 Left 04/12/23  Hip flexion        Hip extension        Hip abduction        Hip adduction        Hip internal rotation        Hip external rotation        Knee flexion 133/P 138 99 P 109 A105 108 AA 111 AA 114 AA  Knee extension -5 -11 ext -10 ext     Ankle dorsiflexion        Ankle plantarflexion        Ankle inversion        Ankle eversion         (Blank rows = not tested)  LOWER EXTREMITY MMT:  MMT Right eval Left eval  Hip flexion 4 4-  Hip extension 4 4-  Hip abduction 3+ 3-  Hip adduction    Hip internal rotation    Hip external rotation    Knee flexion 4+ 3-  Knee extension 4 3-  Ankle dorsiflexion    Ankle plantarflexion    Ankle inversion    Ankle eversion     (Blank rows = not tested)  LOWER EXTREMITY SPECIAL TESTS:  NT due to surgery  FUNCTIONAL TESTS:  5 times sit to stand: unable to stand without using hands  30 sec with UE support  3 x 2 minute walk test: 158 ft (462 ft Norm) using rolling walker: 03/28/23: 179 feet  04/18/23: 216 feet  GAIT: Distance walked: 183ft 2 MWT Assistive device utilized: Walker - 2 wheeled Level of assistance: Modified independence Comments: Pt with antalgic gait with 2 wheeled walker   [facility] Adult PT Treatment:                                                DATE: 04/18/23 Therapeutic  Exercise: Seated hip flexion 10 x 2 AROM Supine SLR 10 x 2   Therapeutic Activity:  Gait 62 feet with SPC feet 2 mwt 216 with  RW 6 inch step up 2 UE x 10, 1 UE x 10 NustepL5 x 5 minutes   [facility] Adult PT Treatment:                                                DATE: 04/12/23 Therapeutic Exercise: LAQ 5# 10 x 3  Seated hip flexion 10 x 2 AROM Seated hamstring curl GTB 2 x 10  Supine QS with heel prop SAQ 5# 10 x 3 Hip flexor stretch EOM Seated h/s stretch, foot on stool   Therapeutic Activity: Nustep L 4 x 6 minutes UE/LE for activity tolerance  Bilat heel raise 10 x 2  4 inch step up x 10 bil UE support Supported Squat x 10 STS x 10 with UE assist to rise SLR 10 x 2 AROM  Heel slide AAROM with slide board assist 10x 2 , added strap x 10    [facility] Adult PT Treatment:                                                DATE: 04/02/23 Therapeutic Exercise: LAQ 3# 10 x 3  Seated hip flexion 10 x 2 AROM Seated hamstring curl GTB x 10  Supine QS with heel prop SAQ 5# 10 x 3 Hip flexor stretch EOM  Therapeutic Activity: Nustep L 4 x 6 minutes UE/LE for activity tolerance  Standing Bilat heel raise 10 x 2  STS x 10 with UE assist to rise SLR 10 x 2 AROM  Heel slide AAROM with slide board assist 10x 2 , added strap x 10    [facility] Adult PT Treatment:                                                DATE: 03/28/23 Therapeutic Exercise: LAQ 3# 10 x 3  Seated hamstring curl GTB x 10  Supine QS with heel prop Supine QS x 10  SAQ 5# 10 x 2   Therapeutic Activity: Nustep L 4 x 5 minutes UE/LE for activity tolerance  2 MWT 179 feet with RW  STS x 10 with UE assist  SLR 5 x 3  Heel slide AAROM with slide board assist 10x 2 , added strap x 10     [facility] Adult PT Treatment:                                                DATE: 03/26/23 Therapeutic Exercise: Supine QS with heel prop  SAQ 10 x 3   Therapeutic Activity: Nustep L3 UE/LE for activity tolerance x 5 min STS 5 x 2 without UE  from elevated mat  Seated Knee extension AROM 10 x 3 SLR AROM 5 x 3  Supine knee flexion AAROM 10 x 3  TREATMENT DATE: eval and issue HEP    PATIENT EDUCATION:  Education details: POC explanation of findings, issue HEP  Person educated: Patient Education method: Explanation, Demonstration, Tactile cues, Verbal cues, and Handouts Education comprehension: verbalized understanding, returned demonstration, verbal cues required, tactile cues required, and needs further education  HOME EXERCISE PROGRAM: Access Code: [email or web address]  updated 03-21-23 URL: [email or web address] Date: 03/21/2023 Prepared by: Garen Lah  Exercises - Sit to stand with sink support Movement snack  - 1 x daily - 7 x weekly - 3 sets - 10 reps - Supine Knee Extension Stretch on Towel Roll  - 1 x daily - 7 x weekly - 3 sets - 10 reps - 5 sec  hold - Supine Heel Slide with Strap  - 1 x daily - 7 x weekly - 3 sets - 10 reps - Supine Short Arc Quad  - 1 x daily - 7 x weekly - 3 sets - 10 reps - Seated Hamstring Stretch  - 1-2 x daily - 7 x weekly - 1 sets - 3 reps - 30 sec hold - Long Arc Quad  - 1 x daily - 7 x weekly - 3 sets - 10 reps - Seated Knee Flexion AAROM  - 1 x daily - 7 x weekly - 3 sets - 10 reps  ASSESSMENT:  CLINICAL IMPRESSION:  Pt reports improvement arriving with 0/10 pain. Able to progress closed chain today to 6 inch step up with good tolerance. 2 MWT improved. Able to ambulate with SPC with good safety and sequencing but with fatigue reported after 62 feet. She is progressing toward STG and LTG.     EVAL- Patient is a 78 y.o. female who was seen today for physical therapy evaluation and treatment for L TKA. Pt has hx of DVT on Eliquis , sedentary life style and decreased mobility.  Pt lives alone and would like to improve mobility in order to safely  live on her own.  Pt would like to be able to safely enter and exit tub and perform household chores safely in home with adequate sleep.  Pt tend to be more sedentary and understands she needs to improve mobility in order to prenvent DVTS.  Pt will benefit from skilled PT to address impairments and maximize functional mobility  OBJECTIVE IMPAIRMENTS: Abnormal gait, decreased activity tolerance, decreased knowledge of use of DME, decreased mobility, difficulty walking, decreased ROM, decreased strength, increased edema, postural dysfunction, obesity, and pain.   ACTIVITY LIMITATIONS: carrying, lifting, bending, standing, squatting, sleeping, stairs, transfers, hygiene/grooming, and locomotion level  PARTICIPATION LIMITATIONS: meal prep, cleaning, laundry, shopping, and church  PERSONAL FACTORS: Hx of DVT, hyperlipdemia, HTN, Hx of fall but not in last 6 months are also affecting patient's functional outcome. hepatitis  REHAB POTENTIAL: Good  CLINICAL DECISION MAKING: Evolving/moderate complexity  EVALUATION COMPLEXITY: Moderate   GOALS: Goals reviewed with patient? Yes  SHORT TERM GOALS: Target date: 04-09-23  Pt will be independent with initial HEP Baseline:no knowledge Goal status: PARTIALLY  MET  2.  AROM of knee extension -10 to 100 flexion to increase mobility for transitional movements Baseline: eval left flexion 99 degrees 04/12/23: AAROM 114 Goal status: ONGOING  3.  Demonstrate and verbalize understanding of condition management including RICE, positioning, use of A.D., HEP Baseline: using 2 wheel walker at eval 04/12/23: using elevation Goal status: MET    LONG TERM GOALS: Target date: 05-14-23  Pt will be independent with advanced HEP Baseline: no knowledge Goal status: INITIAL  2.  Pt will be able demostrate 5 x STS in uncer 15 seconds to show improved LE strength. Baseline: Pt with heavy use of arms for 30 sec STS 3 x only Goal status: INITIAL  3.  Pt will be able  to use LRAD in order to walk and return to church Baseline: using 2 wheeled walker  04/18/23: SPC 62 feet SBA Goal status: INITIAL  4.  Pt will be educated and demonstrate ability to rise from ground to standing to encourage safety in home living alone Baseline: unable to perform floor to stand x fer Goal status: INITIAL  5.  Pt will improve her L knee flexion to  >/= 120 degrees and extension to </= 5 degrees with </= 2/10 pain for a more functional and efficient gait pattern Baseline: See AROM chart Goal status: INITIAL  6.  Patient will report improved functional level on LEFS >/= 50/80 in order to allow for return to prior exercise level Baseline: eval 18/80 22.5 % Goal status: INITIAL   PLAN:  PT FREQUENCY: 2x/week  PT DURATION: 8 weeks  PLANNED INTERVENTIONS: 97164- PT Re-evaluation, 97110-Therapeutic exercises, 97530- Therapeutic activity, 97112- Neuromuscular re-education, 97535- Self Care, 52841- Manual therapy, [phone]- Gait training, [phone]- Electrical stimulation (manual), Patient/Family education, Balance training, Stair training, Taping, Dry Needling, Joint mobilization, DME instructions, Cryotherapy, and Moist heat  PLAN FOR NEXT SESSION: HEP, manual for increasing knee flex / ext  Jannette Spanner, PTA 04/18/23 3:44 PM Phone: [phone] Fax: [phone]   For all possible CPT codes, reference the Planned Interventions line above.     Check all conditions that are expected to impact treatment: {Conditions expected to impact treatment:Morbid obesity, Diabetes mellitus, and Musculoskeletal disorders   If treatment provided at initial evaluation, no treatment charged due to lack of authorization.

## 2023-04-23 ENCOUNTER — Ambulatory Visit

## 2023-04-23 DIAGNOSIS — M25562 Pain in left knee: Secondary | ICD-10-CM

## 2023-04-23 DIAGNOSIS — R262 Difficulty in walking, not elsewhere classified: Secondary | ICD-10-CM

## 2023-04-23 NOTE — Therapy (Signed)
 OUTPATIENT PHYSICAL THERAPY TREATMENT   Patient Name: Tina Bryant MRN: [id number] DOB:[birth year], 78 y.o., female Today's Date: 04/23/2023  END OF SESSION:  PT End of Session - 04/23/23 1253     Visit Number 8    Number of Visits 16    Date for PT Re-Evaluation 05/14/23    Authorization Type [place] [employer]    Authorization Time Period 03/21/23-06/19/23    Authorization - Visit Number 7    Authorization - Number of Visits 13    PT Start Time 1315    PT Stop Time 1355    PT Time Calculation (min) 40 min    Activity Tolerance Patient limited by pain;Patient tolerated treatment well    Behavior During Therapy [facility] for tasks assessed/performed               Past Medical History:  Diagnosis Date   Arthritis    Chronic kidney disease    CKD3   DVT (deep venous thrombosis) (HCC)    Hepatitis    b   Hypertension    Pneumonia    Pre-diabetes    Past Surgical History:  Procedure Laterality Date   EYE SURGERY Left    strabismus surgery   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   Procedure: TOTAL KNEE ARTHROPLASTY, OPEN REDUCTION INTERNAL FIXATION OF MEDIAL FEMORAL CONDYLE;  Surgeon: Jodi Geralds, MD;  Location: [facility];  Service: Orthopedics;  Laterality: Left;   Patient Active Problem List   Diagnosis Date Noted   Primary osteoarthritis of left knee 02/03/2023   DVT (deep venous thrombosis) (HCC) 11/01/2022   Fall at home, initial encounter 11/01/2022   CAP (community acquired pneumonia) 11/01/2022   Severe sepsis (HCC) 11/01/2022   AKI (acute kidney injury) (HCC) 11/01/2022   Acute metabolic encephalopathy 11/01/2022   CKD (chronic kidney disease) stage 3, GFR 30-59 ml/min (HCC) 11/01/2022   Elevated troponin 11/01/2022   Non-insulin treated type 2 diabetes mellitus (HCC) 11/01/2022   High anion gap metabolic acidosis 11/01/2022   Acute deep vein thrombosis (DVT) of femoral vein of left lower extremity (HCC) 06/08/2022   Hyperlipidemia 07/09/2006    Microcytic anemia 07/09/2006   DEPRESSION 07/09/2006   Essential hypertension 07/09/2006    PCP: Felix Pacini, FNP   REFERRING PROVIDER: Jodi Geralds, MD   REFERRING DIAG: Left TKA  THERAPY DIAG:  Acute pain of left knee  Difficulty in walking, not elsewhere classified  Rationale for Evaluation and Treatment: Rehabilitation  ONSET DATE: 02-04-23 surgery L TKA had trouble for many years before surgery  SUBJECTIVE:   SUBJECTIVE STATEMENT: Pt presents to PT with reports of no current pain, feels like she is walking better.    EVAL: Had the surgery on February 04, 2023 of L TKA with Dr Luiz Blare. I have been doing exercises that I received from my HHPT.  I have been trying to walk a lot and I walk every day. I do not drive. I can stand about 5 minutes right now. I can sit as long as I want. I want to go back to church  PERTINENT HISTORY: Hx of DVT, hyperlipdemia, HTN, Hx of fall but not in last 6 months hepatitis PAIN:  Are you having pain? Yes: NPRS scale: 0/10 in left knee and at worst 10/10 Pain location: Left knee  Pain description: sometimes sharp going down to foot Aggravating factors: standing on leg for longer than 5 minutes, straightening the leg, swelling in left knee, I can't get in the tub without  difficulty, mop floor and household chores, can't shop and carry groceries Relieving factors: not much right now Can't bend over to do grooming and cut my toe nails PRECAUTIONS: Other: post left TKA  RED FLAGS: None   WEIGHT BEARING RESTRICTIONS: Yes WBAT L  FALLS:  Has patient fallen in last 6 months? No  LIVING ENVIRONMENT: Lives with: lives alone Lives in: House/apartment Stairs: Yes: External: 2 steps; on left going up Has following equipment at home: Single point cane, Walker - 2 wheeled, shower chair, and Grab bars  OCCUPATION: retired    used to work at group home  PLOF: Independent  PATIENT GOALS: I want to be able to walk with a cane and to go  back to church.  NEXT MD VISIT: TBD  OBJECTIVE:  Note: Objective measures were completed at Evaluation unless otherwise noted.  DIAGNOSTIC FINDINGS: see imaging  PATIENT SURVEYS:  LEFS 18/80 22.5%  COGNITION: Overall cognitive status: Within functional limits for tasks assessed     SENSATION: WFL  EDEMA:  Circumferential: R knee 44.0cm and Left knee is 48.25 cm  MUSCLE LENGTH: Hamstrings: Right 54 deg; Left 50 deg Thomas test: + bil  POSTURE: rounded shoulders, forward head, flexed trunk , and weight shift right  PALPATION: Well healing TKA L but has a wound opending on medial side of scar  LOWER EXTREMITY ROM:  Active ROM Right eval Left eval Left 03-21-23 Left 03/28/23 Left 04/02/23 Left 04/12/23  Hip flexion        Hip extension        Hip abduction        Hip adduction        Hip internal rotation        Hip external rotation        Knee flexion 133/P 138 99 P 109 A105 108 AA 111 AA 114 AA  Knee extension -5 -11 ext -10 ext     Ankle dorsiflexion        Ankle plantarflexion        Ankle inversion        Ankle eversion         (Blank rows = not tested)  LOWER EXTREMITY MMT:  MMT Right eval Left eval  Hip flexion 4 4-  Hip extension 4 4-  Hip abduction 3+ 3-  Hip adduction    Hip internal rotation    Hip external rotation    Knee flexion 4+ 3-  Knee extension 4 3-  Ankle dorsiflexion    Ankle plantarflexion    Ankle inversion    Ankle eversion     (Blank rows = not tested)  LOWER EXTREMITY SPECIAL TESTS:  NT due to surgery  FUNCTIONAL TESTS:  5 times sit to stand: unable to stand without using hands  30 sec with UE support  3 x 2 minute walk test: 158 ft (462 ft Norm) using rolling walker: 03/28/23: 179 feet  04/18/23: 216 feet  GAIT: Distance walked: 135ft 2 MWT Assistive device utilized: Walker - 2 wheeled Level of assistance: Modified independence Comments: Pt with antalgic gait with 2 wheeled walker   TREATMENT: [facility] Adult PT  Treatment:                                                DATE: 04/23/23 Therapeutic Exercise: LAQ 3x10 3# L Supine OS x  10 L - 5" hold Supine SLR 2x10 L Supine heel slide with strap x 10 - 5 Therapeutic Activity: Amb with SPC 2x178ft with CGA  Seated L LE march 4# 3x10 STS 2x5 slow lowering for eccentric - high table SAQ 3x10 2# L  [facility] Adult PT Treatment:                                                DATE: 04/18/23 Therapeutic Exercise: Seated hip flexion 10 x 2 AROM Supine SLR 10 x 2  Therapeutic Activity: Gait 62 feet with SPC feet 2 mwt 216 with RW 6 inch step up 2 UE x 10, 1 UE x 10 NustepL5 x 5 minutes   PATIENT EDUCATION:  Education details: continue HEP Person educated: Patient Education method: Explanation, Demonstration, Tactile cues, Verbal cues, and Handouts Education comprehension: verbalized understanding, returned demonstration, verbal cues required, tactile cues required, and needs further education  HOME EXERCISE PROGRAM: Access Code: [email or web address]  updated 03-21-23 URL: [email or web address] Date: 03/21/2023 Prepared by: Sharlet Dawson  Exercises - Sit to stand with sink support Movement snack  - 1 x daily - 7 x weekly - 3 sets - 10 reps - Supine Knee Extension Stretch on Towel Roll  - 1 x daily - 7 x weekly - 3 sets - 10 reps - 5 sec  hold - Supine Heel Slide with Strap  - 1 x daily - 7 x weekly - 3 sets - 10 reps - Supine Short Arc Quad  - 1 x daily - 7 x weekly - 3 sets - 10 reps - Seated Hamstring Stretch  - 1-2 x daily - 7 x weekly - 1 sets - 3 reps - 30 sec hold - Long Arc Quad  - 1 x daily - 7 x weekly - 3 sets - 10 reps - Seated Knee Flexion AAROM  - 1 x daily - 7 x weekly - 3 sets - 10 reps  ASSESSMENT:  CLINICAL IMPRESSION: Pt was able to complete all prescribed exercises with no adverse effect. Exercises focused on improving quad and hip flexor strength in order to improve comfort and function. ROM continues to progress well, near  [facility] for flexion and extension. Continues to benefit from skilled PT services, will continue to progress quad strength as able.   EVAL- Patient is a 78 y.o. female who was seen today for physical therapy evaluation and treatment for L TKA. Pt has hx of DVT on Eliquis , sedentary life style and decreased mobility.  Pt lives alone and would like to improve mobility in order to safely live on her own.  Pt would like to be able to safely enter and exit tub and perform household chores safely in home with adequate sleep.  Pt tend to be more sedentary and understands she needs to improve mobility in order to prenvent DVTS.  Pt will benefit from skilled PT to address impairments and maximize functional mobility  OBJECTIVE IMPAIRMENTS: Abnormal gait, decreased activity tolerance, decreased knowledge of use of DME, decreased mobility, difficulty walking, decreased ROM, decreased strength, increased edema, postural dysfunction, obesity, and pain.   ACTIVITY LIMITATIONS: carrying, lifting, bending, standing, squatting, sleeping, stairs, transfers, hygiene/grooming, and locomotion level  PARTICIPATION LIMITATIONS: meal prep, cleaning, laundry, shopping, and church  PERSONAL FACTORS: Hx of DVT, hyperlipdemia, HTN, Hx of fall but not in last 6 months are  also affecting patient's functional outcome. hepatitis  REHAB POTENTIAL: Good  CLINICAL DECISION MAKING: Evolving/moderate complexity  EVALUATION COMPLEXITY: Moderate   GOALS: Goals reviewed with patient? Yes  SHORT TERM GOALS: Target date: 04-09-23  Pt will be independent with initial HEP Baseline:no knowledge Goal status: PARTIALLY  MET  2.  AROM of knee extension -10 to 100 flexion to increase mobility for transitional movements Baseline: eval left flexion 99 degrees 04/12/23: AAROM 114 Goal status: ONGOING  3.  Demonstrate and verbalize understanding of condition management including RICE, positioning, use of A.D., HEP Baseline: using 2 wheel  walker at eval 04/12/23: using elevation Goal status: MET    LONG TERM GOALS: Target date: 05-14-23  Pt will be independent with advanced HEP Baseline: no knowledge Goal status: INITIAL  2.  Pt will be able demostrate 5 x STS in uncer 15 seconds to show improved LE strength. Baseline: Pt with heavy use of arms for 30 sec STS 3 x only Goal status: INITIAL  3.  Pt will be able to use LRAD in order to walk and return to church Baseline: using 2 wheeled walker  04/18/23: SPC 62 feet SBA Goal status: INITIAL  4.  Pt will be educated and demonstrate ability to rise from ground to standing to encourage safety in home living alone Baseline: unable to perform floor to stand x fer Goal status: INITIAL  5.  Pt will improve her L knee flexion to  >/= 120 degrees and extension to </= 5 degrees with </= 2/10 pain for a more functional and efficient gait pattern Baseline: See AROM chart Goal status: INITIAL  6.  Patient will report improved functional level on LEFS >/= 50/80 in order to allow for return to prior exercise level Baseline: eval 18/80 22.5 % Goal status: INITIAL   PLAN:  PT FREQUENCY: 2x/week  PT DURATION: 8 weeks  PLANNED INTERVENTIONS: 97164- PT Re-evaluation, 97110-Therapeutic exercises, 97530- Therapeutic activity, 97112- Neuromuscular re-education, 97535- Self Care, 40981- Manual therapy, [phone]- Gait training, [phone]- Electrical stimulation (manual), Patient/Family education, Balance training, Stair training, Taping, Dry Needling, Joint mobilization, DME instructions, Cryotherapy, and Moist heat  PLAN FOR NEXT SESSION: HEP, manual for increasing knee flex / ext  Ivor Mars PT  04/23/23 2:20 PM   For all possible CPT codes, reference the Planned Interventions line above.     Check all conditions that are expected to impact treatment: {Conditions expected to impact treatment:Morbid obesity, Diabetes mellitus, and Musculoskeletal disorders   If treatment provided at  initial evaluation, no treatment charged due to lack of authorization.

## 2023-04-25 ENCOUNTER — Ambulatory Visit: Admitting: Physical Therapy

## 2023-04-25 ENCOUNTER — Encounter: Payer: Self-pay | Admitting: Physical Therapy

## 2023-04-25 DIAGNOSIS — M25562 Pain in left knee: Secondary | ICD-10-CM | POA: Diagnosis not present

## 2023-04-25 DIAGNOSIS — R262 Difficulty in walking, not elsewhere classified: Secondary | ICD-10-CM

## 2023-04-25 NOTE — Therapy (Signed)
 OUTPATIENT PHYSICAL THERAPY TREATMENT   Patient Name: Tina Bryant MRN: [id number] DOB:July 13, 1945, 78 y.o., female Today's Date: 04/25/2023  END OF SESSION:  PT End of Session - 04/25/23 1318     Visit Number 9    Number of Visits 16    Date for PT Re-Evaluation 05/14/23    Authorization Type [place] [employer]    Authorization Time Period 03/21/23-06/19/23    Authorization - Visit Number 8    Authorization - Number of Visits 13    PT Start Time 1315    PT Stop Time 1400    PT Time Calculation (min) 45 min               Past Medical History:  Diagnosis Date   Arthritis    Chronic kidney disease    CKD3   DVT (deep venous thrombosis) (HCC)    Hepatitis    b   Hypertension    Pneumonia    Pre-diabetes    Past Surgical History:  Procedure Laterality Date   EYE SURGERY Left    strabismus surgery   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   Procedure: TOTAL KNEE ARTHROPLASTY, OPEN REDUCTION INTERNAL FIXATION OF MEDIAL FEMORAL CONDYLE;  Surgeon: Neil Balls, MD;  Location: [facility];  Service: Orthopedics;  Laterality: Left;   Patient Active Problem List   Diagnosis Date Noted   Primary osteoarthritis of left knee 02/03/2023   DVT (deep venous thrombosis) (HCC) 11/01/2022   Fall at home, initial encounter 11/01/2022   CAP (community acquired pneumonia) 11/01/2022   Severe sepsis (HCC) 11/01/2022   AKI (acute kidney injury) (HCC) 11/01/2022   Acute metabolic encephalopathy 11/01/2022   CKD (chronic kidney disease) stage 3, GFR 30-59 ml/min (HCC) 11/01/2022   Elevated troponin 11/01/2022   Non-insulin treated type 2 diabetes mellitus (HCC) 11/01/2022   High anion gap metabolic acidosis 11/01/2022   Acute deep vein thrombosis (DVT) of femoral vein of left lower extremity (HCC) 06/08/2022   Hyperlipidemia 07/09/2006   Microcytic anemia 07/09/2006   DEPRESSION 07/09/2006   Essential hypertension 07/09/2006    PCP: Janifer Meigs, FNP   REFERRING  PROVIDER: Neil Balls, MD   REFERRING DIAG: Left TKA  THERAPY DIAG:  Acute pain of left knee  Difficulty in walking, not elsewhere classified  Rationale for Evaluation and Treatment: Rehabilitation  ONSET DATE: 02-04-23 surgery L TKA had trouble for many years before surgery  SUBJECTIVE:   SUBJECTIVE STATEMENT: Pt presents to PT with reports of no current pain.   EVAL: Had the surgery on February 04, 2023 of L TKA with Dr Murrell Arrant. I have been doing exercises that I received from my HHPT.  I have been trying to walk a lot and I walk every day. I do not drive. I can stand about 5 minutes right now. I can sit as long as I want. I want to go back to church  PERTINENT HISTORY: Hx of DVT, hyperlipdemia, HTN, Hx of fall but not in last 6 months hepatitis PAIN:  Are you having pain? Yes: NPRS scale: 0/10 in left knee and at worst 10/10 Pain location: Left knee  Pain description: sometimes sharp going down to foot Aggravating factors: standing on leg for longer than 5 minutes, straightening the leg, swelling in left knee, I can't get in the tub without difficulty, mop floor and household chores, can't shop and carry groceries Relieving factors: not much right now Can't bend over to do grooming and cut my toe nails PRECAUTIONS:  Other: post left TKA  RED FLAGS: None   WEIGHT BEARING RESTRICTIONS: Yes WBAT L  FALLS:  Has patient fallen in last 6 months? No  LIVING ENVIRONMENT: Lives with: lives alone Lives in: House/apartment Stairs: Yes: External: 2 steps; on left going up Has following equipment at home: Single point cane, Walker - 2 wheeled, shower chair, and Grab bars  OCCUPATION: retired    used to work at group home  PLOF: Independent  PATIENT GOALS: I want to be able to walk with a cane and to go back to church.  NEXT MD VISIT: TBD  OBJECTIVE:  Note: Objective measures were completed at Evaluation unless otherwise noted.  DIAGNOSTIC FINDINGS: see imaging  PATIENT  SURVEYS:  LEFS 18/80 22.5%  COGNITION: Overall cognitive status: Within functional limits for tasks assessed     SENSATION: WFL  EDEMA:  Circumferential: R knee 44.0cm and Left knee is 48.25 cm  MUSCLE LENGTH: Hamstrings: Right 54 deg; Left 50 deg Thomas test: + bil  POSTURE: rounded shoulders, forward head, flexed trunk , and weight shift right  PALPATION: Well healing TKA L but has a wound opending on medial side of scar  LOWER EXTREMITY ROM:  Active ROM Right eval Left eval Left 03-21-23 Left 03/28/23 Left 04/02/23 Left 04/12/23 Left 04/25/23  Hip flexion         Hip extension         Hip abduction         Hip adduction         Hip internal rotation         Hip external rotation         Knee flexion 133/P 138 99 P 109 A105 108 AA 111 AA 114 AA 114 AROM  Knee extension -5 -11 ext -10 ext      Ankle dorsiflexion         Ankle plantarflexion         Ankle inversion         Ankle eversion          (Blank rows = not tested)  LOWER EXTREMITY MMT:  MMT Right eval Left eval  Hip flexion 4 4-  Hip extension 4 4-  Hip abduction 3+ 3-  Hip adduction    Hip internal rotation    Hip external rotation    Knee flexion 4+ 3-  Knee extension 4 3-  Ankle dorsiflexion    Ankle plantarflexion    Ankle inversion    Ankle eversion     (Blank rows = not tested)  LOWER EXTREMITY SPECIAL TESTS:  NT due to surgery  FUNCTIONAL TESTS:  5 times sit to stand: unable to stand without using hands  30 sec with UE support  3 x 2 minute walk test: 158 ft (462 ft Norm) using rolling walker: 03/28/23: 179 feet  04/18/23: 216 feet  GAIT: Distance walked: 173ft 2 MWT Assistive device utilized: Walker - 2 wheeled Level of assistance: Modified independence Comments: Pt with antalgic gait with 2 wheeled walker   TREATMENT: [facility] Adult PT Treatment:                                                DATE: 04/25/23 Therapeutic Exercise: LAQ 3x10 3# L Seated L LE march 4# 3x10 SLR 10 x 2   SAQ 4# 10 X 3  Supine clam GTB x 10 Hooklying hip Flexion  GTB 10 x 2  Heel slide AAROM  Therapeutic Activity: Nustep L5 UE/LE x 5 minutes  Amb with SPC 2x184ft with CGA  6 inch step up with 1 UE x 15  STS without UE x 10-elevated mat      [facility] Adult PT Treatment:                                                DATE: 04/23/23 Therapeutic Exercise: LAQ 3x10 3# L Supine OS x 10 L - 5" hold Supine SLR 2x10 L Supine heel slide with strap x 10 - 5 Therapeutic Activity: Amb with SPC 2x116ft with CGA  Seated L LE march 4# 3x10 STS 2x5 slow lowering for eccentric - high table SAQ 3x10 2# L  [facility] Adult PT Treatment:                                                DATE: 04/18/23 Therapeutic Exercise: Seated hip flexion 10 x 2 AROM Supine SLR 10 x 2  Therapeutic Activity: Gait 62 feet with SPC feet 2 mwt 216 with RW 6 inch step up 2 UE x 10, 1 UE x 10 NustepL5 x 5 minutes   PATIENT EDUCATION:  Education details: continue HEP Person educated: Patient Education method: Explanation, Demonstration, Tactile cues, Verbal cues, and Handouts Education comprehension: verbalized understanding, returned demonstration, verbal cues required, tactile cues required, and needs further education  HOME EXERCISE PROGRAM: Access Code: [email or web address]  updated 03-21-23 URL: [email or web address] Date: 03/21/2023 Prepared by: Sharlet Dawson  Exercises - Sit to stand with sink support Movement snack  - 1 x daily - 7 x weekly - 3 sets - 10 reps - Supine Knee Extension Stretch on Towel Roll  - 1 x daily - 7 x weekly - 3 sets - 10 reps - 5 sec  hold - Supine Heel Slide with Strap  - 1 x daily - 7 x weekly - 3 sets - 10 reps - Supine Short Arc Quad  - 1 x daily - 7 x weekly - 3 sets - 10 reps - Seated Hamstring Stretch  - 1-2 x daily - 7 x weekly - 1 sets - 3 reps - 30 sec hold - Long Arc Quad  - 1 x daily - 7 x weekly - 3 sets - 10 reps - Seated Knee Flexion AAROM  - 1 x daily - 7 x weekly -  3 sets - 10 reps  ASSESSMENT:  CLINICAL IMPRESSION: Pt was able to complete all prescribed exercises with no adverse effect. Exercises focused on improving quad and hip flexor strength in order to improve comfort and function. ROM continues to progress well, near [facility] for flexion and extension. Able to continue gait training with [facility] although she fatigues quickly.  Continues to benefit from skilled PT services, will continue to progress quad strength as able. Will plan to start balance as tolerated and continue gait with SPC. Pt anticipates needing a Right TKA soon. Has an appt to see MD regarding open wound medial to TKA scar.   EVAL- Patient is a 78 y.o. female who was seen today for physical therapy evaluation and treatment for L TKA.  Pt has hx of DVT on Eliquis , sedentary life style and decreased mobility.  Pt lives alone and would like to improve mobility in order to safely live on her own.  Pt would like to be able to safely enter and exit tub and perform household chores safely in home with adequate sleep.  Pt tend to be more sedentary and understands she needs to improve mobility in order to prenvent DVTS.  Pt will benefit from skilled PT to address impairments and maximize functional mobility  OBJECTIVE IMPAIRMENTS: Abnormal gait, decreased activity tolerance, decreased knowledge of use of DME, decreased mobility, difficulty walking, decreased ROM, decreased strength, increased edema, postural dysfunction, obesity, and pain.   ACTIVITY LIMITATIONS: carrying, lifting, bending, standing, squatting, sleeping, stairs, transfers, hygiene/grooming, and locomotion level  PARTICIPATION LIMITATIONS: meal prep, cleaning, laundry, shopping, and church  PERSONAL FACTORS: Hx of DVT, hyperlipdemia, HTN, Hx of fall but not in last 6 months are also affecting patient's functional outcome. hepatitis  REHAB POTENTIAL: Good  CLINICAL DECISION MAKING: Evolving/moderate complexity  EVALUATION COMPLEXITY:  Moderate   GOALS: Goals reviewed with patient? Yes  SHORT TERM GOALS: Target date: 04-09-23  Pt will be independent with initial HEP Baseline:no knowledge Goal status: PARTIALLY  MET  2.  AROM of knee extension -10 to 100 flexion to increase mobility for transitional movements Baseline: eval left flexion 99 degrees 04/12/23: AAROM 114 Goal status: ONGOING  3.  Demonstrate and verbalize understanding of condition management including RICE, positioning, use of A.D., HEP Baseline: using 2 wheel walker at eval 04/12/23: using elevation Goal status: MET    LONG TERM GOALS: Target date: 05-14-23  Pt will be independent with advanced HEP Baseline: no knowledge Goal status: INITIAL  2.  Pt will be able demostrate 5 x STS in uncer 15 seconds to show improved LE strength. Baseline: Pt with heavy use of arms for 30 sec STS 3 x only Goal status: INITIAL  3.  Pt will be able to use LRAD in order to walk and return to church Baseline: using 2 wheeled walker  04/18/23: SPC 62 feet SBA Goal status: INITIAL  4.  Pt will be educated and demonstrate ability to rise from ground to standing to encourage safety in home living alone Baseline: unable to perform floor to stand x fer Goal status: INITIAL  5.  Pt will improve her L knee flexion to  >/= 120 degrees and extension to </= 5 degrees with </= 2/10 pain for a more functional and efficient gait pattern Baseline: See AROM chart Goal status: INITIAL  6.  Patient will report improved functional level on LEFS >/= 50/80 in order to allow for return to prior exercise level Baseline: eval 18/80 22.5 % Goal status: INITIAL   PLAN:  PT FREQUENCY: 2x/week  PT DURATION: 8 weeks  PLANNED INTERVENTIONS: 97164- PT Re-evaluation, 97110-Therapeutic exercises, 97530- Therapeutic activity, 97112- Neuromuscular re-education, 97535- Self Care, 19147- Manual therapy, [phone]- Gait training, [phone]- Electrical stimulation (manual), Patient/Family education,  Balance training, Stair training, Taping, Dry Needling, Joint mobilization, DME instructions, Cryotherapy, and Moist heat  PLAN FOR NEXT SESSION: HEP, manual for increasing knee flex / ext  Jannette Spanner, PTA 04/25/23 2:21 PM Phone: [phone] Fax: [phone]    For all possible CPT codes, reference the Planned Interventions line above.     Check all conditions that are expected to impact treatment: {Conditions expected to impact treatment:Morbid obesity, Diabetes mellitus, and Musculoskeletal disorders   If treatment provided at initial evaluation, no treatment charged due  to lack of authorization.

## 2023-04-30 ENCOUNTER — Ambulatory Visit: Payer: Self-pay | Admitting: Physical Therapy

## 2023-04-30 ENCOUNTER — Encounter: Payer: Self-pay | Admitting: Physical Therapy

## 2023-04-30 DIAGNOSIS — M25562 Pain in left knee: Secondary | ICD-10-CM | POA: Diagnosis not present

## 2023-04-30 DIAGNOSIS — G8929 Other chronic pain: Secondary | ICD-10-CM

## 2023-04-30 DIAGNOSIS — R262 Difficulty in walking, not elsewhere classified: Secondary | ICD-10-CM

## 2023-04-30 DIAGNOSIS — M6281 Muscle weakness (generalized): Secondary | ICD-10-CM

## 2023-04-30 NOTE — Therapy (Signed)
 OUTPATIENT PHYSICAL THERAPY TREATMENT   Patient Name: Tina Bryant MRN: [id number] DOB:[birth year], 78 y.o., female Today's Date: 04/30/2023  END OF SESSION:  PT End of Session - 04/30/23 1444     Visit Number 10    Number of Visits 16    Date for PT Re-Evaluation 05/14/23    Authorization Type [employer]    Authorization Time Period 03/21/23-06/19/23    Authorization - Number of Visits 13    PT Start Time 0245    PT Stop Time 0326    PT Time Calculation (min) 41 min               Past Medical History:  Diagnosis Date   Arthritis    Chronic kidney disease    CKD3   DVT (deep venous thrombosis) (HCC)    Hepatitis    b   Hypertension    Pneumonia    Pre-diabetes    Past Surgical History:  Procedure Laterality Date   EYE SURGERY Left    strabismus surgery   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   Procedure: TOTAL KNEE ARTHROPLASTY, OPEN REDUCTION INTERNAL FIXATION OF MEDIAL FEMORAL CONDYLE;  Surgeon: Neil Balls, MD;  Location: [facility];  Service: Orthopedics;  Laterality: Left;   Patient Active Problem List   Diagnosis Date Noted   Primary osteoarthritis of left knee 02/03/2023   DVT (deep venous thrombosis) (HCC) 11/01/2022   Fall at home, initial encounter 11/01/2022   CAP (community acquired pneumonia) 11/01/2022   Severe sepsis (HCC) 11/01/2022   AKI (acute kidney injury) (HCC) 11/01/2022   Acute metabolic encephalopathy 11/01/2022   CKD (chronic kidney disease) stage 3, GFR 30-59 ml/min (HCC) 11/01/2022   Elevated troponin 11/01/2022   Non-insulin  treated type 2 diabetes mellitus (HCC) 11/01/2022   High anion gap metabolic acidosis 11/01/2022   Acute deep vein thrombosis (DVT) of femoral vein of left lower extremity (HCC) 06/08/2022   Hyperlipidemia 07/09/2006   Microcytic anemia 07/09/2006   DEPRESSION 07/09/2006   Essential hypertension 07/09/2006    PCP: Janifer Meigs, FNP   REFERRING PROVIDER: Neil Balls, MD   REFERRING  DIAG: Left TKA  THERAPY DIAG:  Acute pain of left knee  Difficulty in walking, not elsewhere classified  Muscle weakness (generalized)  Chronic right shoulder pain  Rationale for Evaluation and Treatment: Rehabilitation  ONSET DATE: 02-04-23 surgery L TKA had trouble for many years before surgery  SUBJECTIVE:   SUBJECTIVE STATEMENT: Pt reports that she has been doing well and is not having much pain today.   EVAL: Had the surgery on February 04, 2023 of L TKA with Dr Murrell Arrant. I have been doing exercises that I received from my HHPT.  I have been trying to walk a lot and I walk every day. I do not drive. I can stand about 5 minutes right now. I can sit as long as I want. I want to go back to church  PERTINENT HISTORY: Hx of DVT, hyperlipdemia, HTN, Hx of fall but not in last 6 months hepatitis PAIN:  Are you having pain? Yes: NPRS scale: 0/10 in left knee and at worst 10/10 Pain location: Left knee  Pain description: sometimes sharp going down to foot Aggravating factors: standing on leg for longer than 5 minutes, straightening the leg, swelling in left knee, I can't get in the tub without difficulty, mop floor and household chores, can't shop and carry groceries Relieving factors: not much right now Can't bend over to do grooming  and cut my toe nails PRECAUTIONS: Other: post left TKA  RED FLAGS: None   WEIGHT BEARING RESTRICTIONS: Yes WBAT L  FALLS:  Has patient fallen in last 6 months? No  LIVING ENVIRONMENT: Lives with: lives alone Lives in: House/apartment Stairs: Yes: External: 2 steps; on left going up Has following equipment at home: Single point cane, Walker - 2 wheeled, shower chair, and Grab bars  OCCUPATION: retired    used to work at group home  PLOF: Independent  PATIENT GOALS: I want to be able to walk with a cane and to go back to church.  NEXT MD VISIT: TBD  OBJECTIVE:  Note: Objective measures were completed at Evaluation unless otherwise  noted.  DIAGNOSTIC FINDINGS: see imaging  PATIENT SURVEYS:  LEFS 18/80 22.5%  COGNITION: Overall cognitive status: Within functional limits for tasks assessed     SENSATION: WFL  EDEMA:  Circumferential: R knee 44.0cm and Left knee is 48.25 cm  MUSCLE LENGTH: Hamstrings: Right 54 deg; Left 50 deg Thomas test: + bil  POSTURE: rounded shoulders, forward head, flexed trunk , and weight shift right  PALPATION: Well healing TKA L but has a wound opending on medial side of scar  LOWER EXTREMITY ROM:  Active ROM Right eval Left eval Left 03-21-23 Left 03/28/23 Left 04/02/23 Left 04/12/23 Left 04/25/23  Hip flexion         Hip extension         Hip abduction         Hip adduction         Hip internal rotation         Hip external rotation         Knee flexion 133/P 138 99 P 109 A105 108 AA 111 AA 114 AA 114 AROM  Knee extension -5 -11 ext -10 ext      Ankle dorsiflexion         Ankle plantarflexion         Ankle inversion         Ankle eversion          (Blank rows = not tested)  LOWER EXTREMITY MMT:  MMT Right eval Left eval  Hip flexion 4 4-  Hip extension 4 4-  Hip abduction 3+ 3-  Hip adduction    Hip internal rotation    Hip external rotation    Knee flexion 4+ 3-  Knee extension 4 3-  Ankle dorsiflexion    Ankle plantarflexion    Ankle inversion    Ankle eversion     (Blank rows = not tested)  LOWER EXTREMITY SPECIAL TESTS:  NT due to surgery  FUNCTIONAL TESTS:  5 times sit to stand: unable to stand without using hands  30 sec with UE support  3 x 2 minute walk test: 158 ft (462 ft Norm) using rolling walker: 03/28/23: 179 feet  04/18/23: 216 feet  GAIT: Distance walked: 153ft 2 MWT Assistive device utilized: Walker - 2 wheeled Level of assistance: Modified independence Comments: Pt with antalgic gait with 2 wheeled walker   TREATMENT:  [facility] Adult PT Treatment:                                                DATE: 04/30/23 Therapeutic  Exercise: nu-step L5 63m while taking subjective and planning session with patient LAQ - 2x10  5# L, 10x 7.5# Seated L LE march 5# 2x10 SLR 10 x 2  Supine clam Blue TB 2x10 Hooklying hip Flexion Blue TB 10 x 2  Nustep L5 UE/LE x 5 minutes   Therapeutic Activity: Amb with SPC 180 ft with CGA  6 inch step up with 1 UE 2x10 STS without UE x 10-elevated mat   [facility] Adult PT Treatment:                                                DATE: 04/25/23 Therapeutic Exercise: LAQ 3x10 3# L Seated L LE march 4# 3x10 SLR 10 x 2  SAQ 4# 10 X 3  Supine clam GTB x 10 Hooklying hip Flexion  GTB 10 x 2  Heel slide AAROM  Therapeutic Activity: Nustep L5 UE/LE x 5 minutes  Amb with SPC 2x115ft with CGA  6 inch step up with 1 UE x 15  STS without UE x 10-elevated mat      [facility] Adult PT Treatment:                                                DATE: 04/23/23 Therapeutic Exercise: LAQ 3x10 3# L Supine OS x 10 L - 5" hold Supine SLR 2x10 L Supine heel slide with strap x 10 - 5 Therapeutic Activity: Amb with SPC 2x153ft with CGA  Seated L LE march 4# 3x10 STS 2x5 slow lowering for eccentric - high table SAQ 3x10 2# L  [facility] Adult PT Treatment:                                                DATE: 04/18/23 Therapeutic Exercise: Seated hip flexion 10 x 2 AROM Supine SLR 10 x 2  Therapeutic Activity: Gait 62 feet with SPC feet 2 mwt 216 with RW 6 inch step up 2 UE x 10, 1 UE x 10 NustepL5 x 5 minutes   PATIENT EDUCATION:  Education details: continue HEP Person educated: Patient Education method: Explanation, Demonstration, Tactile cues, Verbal cues, and Handouts Education comprehension: verbalized understanding, returned demonstration, verbal cues required, tactile cues required, and needs further education  HOME EXERCISE PROGRAM: Access Code: [email or web address]  updated 03-21-23 URL: [email or web address] Date: 03/21/2023 Prepared by: Sharlet Dawson  Exercises - Sit to stand with  sink support Movement snack  - 1 x daily - 7 x weekly - 3 sets - 10 reps - Supine Knee Extension Stretch on Towel Roll  - 1 x daily - 7 x weekly - 3 sets - 10 reps - 5 sec  hold - Supine Heel Slide with Strap  - 1 x daily - 7 x weekly - 3 sets - 10 reps - Supine Short Arc Quad  - 1 x daily - 7 x weekly - 3 sets - 10 reps - Seated Hamstring Stretch  - 1-2 x daily - 7 x weekly - 1 sets - 3 reps - 30 sec hold - Long Arc Quad  - 1 x daily - 7 x weekly - 3 sets - 10 reps - Seated Knee Flexion AAROM  -  1 x daily - 7 x weekly - 3 sets - 10 reps  ASSESSMENT:  CLINICAL IMPRESSION: Kaiya tolerated session well with no adverse reaction.  Worked on isolated quad strengthening and increased load with LAQ.  Pt able to walk 180 ft with no rest breaks and CGA.  Pt tends to rush through exercises and complete in limited range without cuing.  STS is quite challenging and requires raising table to complete without UE support.  EVAL- Patient is a 78 y.o. female who was seen today for physical therapy evaluation and treatment for L TKA. Pt has hx of DVT on Eliquis  , sedentary life style and decreased mobility.  Pt lives alone and would like to improve mobility in order to safely live on her own.  Pt would like to be able to safely enter and exit tub and perform household chores safely in home with adequate sleep.  Pt tend to be more sedentary and understands she needs to improve mobility in order to prenvent DVTS.  Pt will benefit from skilled PT to address impairments and maximize functional mobility  OBJECTIVE IMPAIRMENTS: Abnormal gait, decreased activity tolerance, decreased knowledge of use of DME, decreased mobility, difficulty walking, decreased ROM, decreased strength, increased edema, postural dysfunction, obesity, and pain.   ACTIVITY LIMITATIONS: carrying, lifting, bending, standing, squatting, sleeping, stairs, transfers, hygiene/grooming, and locomotion level  PARTICIPATION LIMITATIONS: meal prep,  cleaning, laundry, shopping, and church  PERSONAL FACTORS: Hx of DVT, hyperlipdemia, HTN, Hx of fall but not in last 6 months are also affecting patient's functional outcome. hepatitis  REHAB POTENTIAL: Good  CLINICAL DECISION MAKING: Evolving/moderate complexity  EVALUATION COMPLEXITY: Moderate   GOALS: Goals reviewed with patient? Yes  SHORT TERM GOALS: Target date: 04-09-23  Pt will be independent with initial HEP Baseline:no knowledge Goal status: PARTIALLY  MET  2.  AROM of knee extension -10 to 100 flexion to increase mobility for transitional movements Baseline: eval left flexion 99 degrees 04/12/23: AAROM 114 Goal status: ONGOING  3.  Demonstrate and verbalize understanding of condition management including RICE, positioning, use of A.D., HEP Baseline: using 2 wheel walker at eval 04/12/23: using elevation Goal status: MET    LONG TERM GOALS: Target date: 05-14-23  Pt will be independent with advanced HEP Baseline: no knowledge Goal status: INITIAL  2.  Pt will be able demostrate 5 x STS in uncer 15 seconds to show improved LE strength. Baseline: Pt with heavy use of arms for 30 sec STS 3 x only Goal status: INITIAL  3.  Pt will be able to use LRAD in order to walk and return to church Baseline: using 2 wheeled walker  04/18/23: SPC 62 feet SBA Goal status: INITIAL  4.  Pt will be educated and demonstrate ability to rise from ground to standing to encourage safety in home living alone Baseline: unable to perform floor to stand x fer Goal status: INITIAL  5.  Pt will improve her L knee flexion to  >/= 120 degrees and extension to </= 5 degrees with </= 2/10 pain for a more functional and efficient gait pattern Baseline: See AROM chart Goal status: INITIAL  6.  Patient will report improved functional level on LEFS >/= 50/80 in order to allow for return to prior exercise level Baseline: eval 18/80 22.5 % Goal status: INITIAL   PLAN:  PT FREQUENCY:  2x/week  PT DURATION: 8 weeks  PLANNED INTERVENTIONS: 97164- PT Re-evaluation, 97110-Therapeutic exercises, 97530- Therapeutic activity, 97112- Neuromuscular re-education, 97535- Self Care, 16109- Manual  therapy, [phone]- Gait training, 60454- Electrical stimulation (manual), Patient/Family education, Balance training, Stair training, Taping, Dry Needling, Joint mobilization, DME instructions, Cryotherapy, and Moist heat  PLAN FOR NEXT SESSION: HEP, manual for increasing knee flex / ext  Marquis Sitter PT 04/30/23 3:36 PM Phone: [phone] Fax: [phone]    For all possible CPT codes, reference the Planned Interventions line above.     Check all conditions that are expected to impact treatment: {Conditions expected to impact treatment:Morbid obesity, Diabetes mellitus, and Musculoskeletal disorders   If treatment provided at initial evaluation, no treatment charged due to lack of authorization.

## 2023-05-02 ENCOUNTER — Ambulatory Visit: Payer: Self-pay | Admitting: Physical Therapy

## 2023-05-02 ENCOUNTER — Encounter: Payer: Self-pay | Admitting: Physical Therapy

## 2023-05-02 DIAGNOSIS — M25562 Pain in left knee: Secondary | ICD-10-CM

## 2023-05-02 DIAGNOSIS — R262 Difficulty in walking, not elsewhere classified: Secondary | ICD-10-CM

## 2023-05-02 DIAGNOSIS — M6281 Muscle weakness (generalized): Secondary | ICD-10-CM

## 2023-05-02 NOTE — Therapy (Signed)
 OUTPATIENT PHYSICAL THERAPY TREATMENT   Patient Name: Tina Bryant MRN: [id number] DOB:May 08, 1945, 78 y.o., female Today's Date: 05/02/2023  END OF SESSION:  PT End of Session - 05/02/23 1412     Visit Number 11    Number of Visits 16    Date for PT Re-Evaluation 05/14/23    Authorization Type [place] [employer]    Authorization Time Period 03/21/23-06/19/23    Authorization - Visit Number 10    PT Start Time 1413    PT Stop Time 1457    PT Time Calculation (min) 44 min    Activity Tolerance Patient limited by pain;Patient tolerated treatment well    Behavior During Therapy [facility] for tasks assessed/performed                Past Medical History:  Diagnosis Date   Arthritis    Chronic kidney disease    CKD3   DVT (deep venous thrombosis) (HCC)    Hepatitis    b   Hypertension    Pneumonia    Pre-diabetes    Past Surgical History:  Procedure Laterality Date   EYE SURGERY Left    strabismus surgery   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   Procedure: TOTAL KNEE ARTHROPLASTY, OPEN REDUCTION INTERNAL FIXATION OF MEDIAL FEMORAL CONDYLE;  Surgeon: Neil Balls, MD;  Location: [facility];  Service: Orthopedics;  Laterality: Left;   Patient Active Problem List   Diagnosis Date Noted   Primary osteoarthritis of left knee 02/03/2023   DVT (deep venous thrombosis) (HCC) 11/01/2022   Fall at home, initial encounter 11/01/2022   CAP (community acquired pneumonia) 11/01/2022   Severe sepsis (HCC) 11/01/2022   AKI (acute kidney injury) (HCC) 11/01/2022   Acute metabolic encephalopathy 11/01/2022   CKD (chronic kidney disease) stage 3, GFR 30-59 ml/min (HCC) 11/01/2022   Elevated troponin 11/01/2022   Non-insulin  treated type 2 diabetes mellitus (HCC) 11/01/2022   High anion gap metabolic acidosis 11/01/2022   Acute deep vein thrombosis (DVT) of femoral vein of left lower extremity (HCC) 06/08/2022   Hyperlipidemia 07/09/2006   Microcytic anemia 07/09/2006   DEPRESSION  07/09/2006   Essential hypertension 07/09/2006    PCP: Janifer Meigs, FNP   REFERRING PROVIDER: Neil Balls, MD   REFERRING DIAG: Left TKA  THERAPY DIAG:  No diagnosis found.  Rationale for Evaluation and Treatment: Rehabilitation  ONSET DATE: 02-04-23 surgery L TKA had trouble for many years before surgery  SUBJECTIVE:   SUBJECTIVE STATEMENT: 05/02/2023 " doing pretty good, no pain today."   EVAL: Had the surgery on February 04, 2023 of L TKA with Dr Murrell Arrant. I have been doing exercises that I received from my HHPT.  I have been trying to walk a lot and I walk every day. I do not drive. I can stand about 5 minutes right now. I can sit as long as I want. I want to go back to church  PERTINENT HISTORY: Hx of DVT, hyperlipdemia, HTN, Hx of fall but not in last 6 months hepatitis PAIN:  Are you having pain? Yes: NPRS scale: 0/10 in left knee and at worst 10/10 Pain location: Left knee  Pain description: sometimes sharp going down to foot Aggravating factors: standing on leg for longer than 5 minutes, straightening the leg, swelling in left knee, I can't get in the tub without difficulty, mop floor and household chores, can't shop and carry groceries Relieving factors: not much right now Can't bend over to do grooming and cut my  toe nails PRECAUTIONS: Other: post left TKA  RED FLAGS: None   WEIGHT BEARING RESTRICTIONS: Yes WBAT L  FALLS:  Has patient fallen in last 6 months? No  LIVING ENVIRONMENT: Lives with: lives alone Lives in: House/apartment Stairs: Yes: External: 2 steps; on left going up Has following equipment at home: Single point cane, Walker - 2 wheeled, shower chair, and Grab bars  OCCUPATION: retired    used to work at group home  PLOF: Independent  PATIENT GOALS: I want to be able to walk with a cane and to go back to church.  NEXT MD VISIT: TBD  OBJECTIVE:  Note: Objective measures were completed at Evaluation unless otherwise  noted.  DIAGNOSTIC FINDINGS: see imaging  PATIENT SURVEYS:  LEFS 18/80 22.5%  COGNITION: Overall cognitive status: Within functional limits for tasks assessed     SENSATION: WFL  EDEMA:  Circumferential: R knee 44.0cm and Left knee is 48.25 cm  MUSCLE LENGTH: Hamstrings: Right 54 deg; Left 50 deg Thomas test: + bil  POSTURE: rounded shoulders, forward head, flexed trunk , and weight shift right  PALPATION: Well healing TKA L but has a wound opending on medial side of scar  LOWER EXTREMITY ROM:  Active ROM Right eval Left eval Left 03-21-23 Left 03/28/23 Left 04/02/23 Left 04/12/23 Left 04/25/23  Hip flexion         Hip extension         Hip abduction         Hip adduction         Hip internal rotation         Hip external rotation         Knee flexion 133/P 138 99 P 109 A105 108 AA 111 AA 114 AA 114 AROM  Knee extension -5 -11 ext -10 ext      Ankle dorsiflexion         Ankle plantarflexion         Ankle inversion         Ankle eversion          (Blank rows = not tested)  LOWER EXTREMITY MMT:  MMT Right eval Left eval  Hip flexion 4 4-  Hip extension 4 4-  Hip abduction 3+ 3-  Hip adduction    Hip internal rotation    Hip external rotation    Knee flexion 4+ 3-  Knee extension 4 3-  Ankle dorsiflexion    Ankle plantarflexion    Ankle inversion    Ankle eversion     (Blank rows = not tested)  LOWER EXTREMITY SPECIAL TESTS:  NT due to surgery  FUNCTIONAL TESTS:  5 times sit to stand: unable to stand without using hands  30 sec with UE support  3 x 2 minute walk test: 158 ft (462 ft Norm) using rolling walker: 03/28/23: 179 feet  04/18/23: 216 feet  GAIT: Distance walked: 18ft 2 MWT Assistive device utilized: Walker - 2 wheeled Level of assistance: Modified independence Comments: Pt with antalgic gait with 2 wheeled walker   TREATMENT: [facility] Adult PT Treatment:                                                DATE: 05/02/23 Nu-step L5 x 5 min LE  only Sit to stand 3 x 5 from hi- lo table lowered between sets  to promote strength LAQ 5# 3 x 10 with tactile cues for proper height Seated marching 2 x 10, 1 x 20  with 5# Gait with SPC 163f with SPC CGA 3:34 sec Alternating toe taps on 6 inch step with SPC / CGA 2 x 10   [facility] Adult PT Treatment:                                                DATE: 04/30/23 Therapeutic Exercise: nu-step L5 89m while taking subjective and planning session with patient LAQ - 2x10 5# L, 10x 7.5# Seated L LE march 5# 2x10 SLR 10 x 2  Supine clam Blue TB 2x10 Hooklying hip Flexion Blue TB 10 x 2  Nustep L5 UE/LE x 5 minutes   Therapeutic Activity: Amb with SPC 180 ft with CGA  6 inch step up with 1 UE 2x10 STS without UE x 10-elevated mat   [facility] Adult PT Treatment:                                                DATE: 04/25/23 Therapeutic Exercise: LAQ 3x10 3# L Seated L LE march 4# 3x10 SLR 10 x 2  SAQ 4# 10 X 3  Supine clam GTB x 10 Hooklying hip Flexion  GTB 10 x 2  Heel slide AAROM  Therapeutic Activity: Nustep L5 UE/LE x 5 minutes  Amb with SPC 2x178ft with CGA  6 inch step up with 1 UE x 15  STS without UE x 10-elevated mat    PATIENT EDUCATION:  Education details: continue HEP Person educated: Patient Education method: Explanation, Demonstration, Tactile cues, Verbal cues, and Handouts Education comprehension: verbalized understanding, returned demonstration, verbal cues required, tactile cues required, and needs further education  HOME EXERCISE PROGRAM: Access Code: [email or web address]  updated 03-21-23 URL: [email or web address] Date: 03/21/2023 Prepared by: Sharlet Dawson  Exercises - Sit to stand with sink support Movement snack  - 1 x daily - 7 x weekly - 3 sets - 10 reps - Supine Knee Extension Stretch on Towel Roll  - 1 x daily - 7 x weekly - 3 sets - 10 reps - 5 sec  hold - Supine Heel Slide with Strap  - 1 x daily - 7 x weekly - 3 sets - 10 reps - Supine Short Arc  Quad  - 1 x daily - 7 x weekly - 3 sets - 10 reps - Seated Hamstring Stretch  - 1-2 x daily - 7 x weekly - 1 sets - 3 reps - 30 sec hold - Long Arc Quad  - 1 x daily - 7 x weekly - 3 sets - 10 reps - Seated Knee Flexion AAROM  - 1 x daily - 7 x weekly - 3 sets - 10 reps  ASSESSMENT:  CLINICAL IMPRESSION: Russie arrives to PT reporting no pain. Continued working on gross LE strengthening and focused on sit to stand per pt report of her greatest challenges, mod verbal cues require for proper form and to maximize safety with for getting of a chair. Continued practice using SPC which she did well with but noted it was heavy, I instructed her to bring her RW and her cane from home to work on  that.   EVAL- Patient is a 78 y.o. female who was seen today for physical therapy evaluation and treatment for L TKA. Pt has hx of DVT on Eliquis  , sedentary life style and decreased mobility.  Pt lives alone and would like to improve mobility in order to safely live on her own.  Pt would like to be able to safely enter and exit tub and perform household chores safely in home with adequate sleep.  Pt tend to be more sedentary and understands she needs to improve mobility in order to prenvent DVTS.  Pt will benefit from skilled PT to address impairments and maximize functional mobility  OBJECTIVE IMPAIRMENTS: Abnormal gait, decreased activity tolerance, decreased knowledge of use of DME, decreased mobility, difficulty walking, decreased ROM, decreased strength, increased edema, postural dysfunction, obesity, and pain.   ACTIVITY LIMITATIONS: carrying, lifting, bending, standing, squatting, sleeping, stairs, transfers, hygiene/grooming, and locomotion level  PARTICIPATION LIMITATIONS: meal prep, cleaning, laundry, shopping, and church  PERSONAL FACTORS: Hx of DVT, hyperlipdemia, HTN, Hx of fall but not in last 6 months are also affecting patient's functional outcome. hepatitis  REHAB POTENTIAL: Good  CLINICAL  DECISION MAKING: Evolving/moderate complexity  EVALUATION COMPLEXITY: Moderate   GOALS: Goals reviewed with patient? Yes  SHORT TERM GOALS: Target date: 04-09-23  Pt will be independent with initial HEP Baseline:no knowledge Goal status: PARTIALLY  MET  2.  AROM of knee extension -10 to 100 flexion to increase mobility for transitional movements Baseline: eval left flexion 99 degrees 04/12/23: AAROM 114 Goal status: ONGOING  3.  Demonstrate and verbalize understanding of condition management including RICE, positioning, use of A.D., HEP Baseline: using 2 wheel walker at eval 04/12/23: using elevation Goal status: MET    LONG TERM GOALS: Target date: 05-14-23  Pt will be independent with advanced HEP Baseline: no knowledge Goal status: INITIAL  2.  Pt will be able demostrate 5 x STS in uncer 15 seconds to show improved LE strength. Baseline: Pt with heavy use of arms for 30 sec STS 3 x only Goal status: INITIAL  3.  Pt will be able to use LRAD in order to walk and return to church Baseline: using 2 wheeled walker  04/18/23: SPC 62 feet SBA Goal status: INITIAL  4.  Pt will be educated and demonstrate ability to rise from ground to standing to encourage safety in home living alone Baseline: unable to perform floor to stand x fer Goal status: INITIAL  5.  Pt will improve her L knee flexion to  >/= 120 degrees and extension to </= 5 degrees with </= 2/10 pain for a more functional and efficient gait pattern Baseline: See AROM chart Goal status: INITIAL  6.  Patient will report improved functional level on LEFS >/= 50/80 in order to allow for return to prior exercise level Baseline: eval 18/80 22.5 % Goal status: INITIAL  PLAN:  PT FREQUENCY: 2x/week  PT DURATION: 8 weeks  PLANNED INTERVENTIONS: 97164- PT Re-evaluation, 97110-Therapeutic exercises, 97530- Therapeutic activity, 97112- Neuromuscular re-education, 97535- Self Care, [id number]- Manual therapy, [id number]- Gait  training, [phone]- Electrical stimulation (manual), Patient/Family education, Balance training, Stair training, Taping, Dry Needling, Joint mobilization, DME instructions, Cryotherapy, and Moist heat  PLAN FOR NEXT SESSION: HEP, manual for increasing knee flex / ext.did pt bring her SPC from home?  Amulya Quintin PT, DPT, LAT, ATC  05/02/23  4:40 PM Phone: [phone] Fax: [phone]

## 2023-05-06 ENCOUNTER — Encounter (HOSPITAL_BASED_OUTPATIENT_CLINIC_OR_DEPARTMENT_OTHER): Attending: Internal Medicine | Admitting: Internal Medicine

## 2023-05-06 DIAGNOSIS — Z86718 Personal history of other venous thrombosis and embolism: Secondary | ICD-10-CM | POA: Insufficient documentation

## 2023-05-06 DIAGNOSIS — Z96652 Presence of left artificial knee joint: Secondary | ICD-10-CM | POA: Diagnosis not present

## 2023-05-06 DIAGNOSIS — E1122 Type 2 diabetes mellitus with diabetic chronic kidney disease: Secondary | ICD-10-CM | POA: Diagnosis not present

## 2023-05-06 DIAGNOSIS — I120 Hypertensive chronic kidney disease with stage 5 chronic kidney disease or end stage renal disease: Secondary | ICD-10-CM | POA: Diagnosis not present

## 2023-05-06 DIAGNOSIS — E785 Hyperlipidemia, unspecified: Secondary | ICD-10-CM | POA: Insufficient documentation

## 2023-05-06 DIAGNOSIS — G8929 Other chronic pain: Secondary | ICD-10-CM | POA: Diagnosis not present

## 2023-05-06 DIAGNOSIS — L97828 Non-pressure chronic ulcer of other part of left lower leg with other specified severity: Secondary | ICD-10-CM | POA: Insufficient documentation

## 2023-05-06 DIAGNOSIS — T8189XA Other complications of procedures, not elsewhere classified, initial encounter: Secondary | ICD-10-CM | POA: Insufficient documentation

## 2023-05-06 DIAGNOSIS — N186 End stage renal disease: Secondary | ICD-10-CM | POA: Diagnosis not present

## 2023-05-07 ENCOUNTER — Ambulatory Visit

## 2023-05-07 DIAGNOSIS — M25562 Pain in left knee: Secondary | ICD-10-CM

## 2023-05-07 DIAGNOSIS — R262 Difficulty in walking, not elsewhere classified: Secondary | ICD-10-CM

## 2023-05-07 DIAGNOSIS — M6281 Muscle weakness (generalized): Secondary | ICD-10-CM

## 2023-05-07 NOTE — Therapy (Signed)
 OUTPATIENT PHYSICAL THERAPY TREATMENT   Patient Name: Tina Bryant MRN: [id number] DOB:October 30, 1945, 78 y.o., female Today's Date: 05/07/2023  END OF SESSION:  PT End of Session - 05/07/23 1423     Visit Number 12    Number of Visits 16    Date for PT Re-Evaluation 05/14/23    Authorization Type [place] [employer]    Authorization Time Period 03/21/23-06/19/23    Authorization - Visit Number 11    PT Start Time 1445    PT Stop Time 1528    PT Time Calculation (min) 43 min    Activity Tolerance Patient limited by pain;Patient tolerated treatment well    Behavior During Therapy [facility] for tasks assessed/performed                 Past Medical History:  Diagnosis Date   Arthritis    Chronic kidney disease    CKD3   DVT (deep venous thrombosis) (HCC)    Hepatitis    b   Hypertension    Pneumonia    Pre-diabetes    Past Surgical History:  Procedure Laterality Date   EYE SURGERY Left    strabismus surgery   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   Procedure: TOTAL KNEE ARTHROPLASTY, OPEN REDUCTION INTERNAL FIXATION OF MEDIAL FEMORAL CONDYLE;  Surgeon: Neil Balls, MD;  Location: [facility];  Service: Orthopedics;  Laterality: Left;   Patient Active Problem List   Diagnosis Date Noted   Primary osteoarthritis of left knee 02/03/2023   DVT (deep venous thrombosis) (HCC) 11/01/2022   Fall at home, initial encounter 11/01/2022   CAP (community acquired pneumonia) 11/01/2022   Severe sepsis (HCC) 11/01/2022   AKI (acute kidney injury) (HCC) 11/01/2022   Acute metabolic encephalopathy 11/01/2022   CKD (chronic kidney disease) stage 3, GFR 30-59 ml/min (HCC) 11/01/2022   Elevated troponin 11/01/2022   Non-insulin  treated type 2 diabetes mellitus (HCC) 11/01/2022   High anion gap metabolic acidosis 11/01/2022   Acute deep vein thrombosis (DVT) of femoral vein of left lower extremity (HCC) 06/08/2022   Hyperlipidemia 07/09/2006   Microcytic anemia 07/09/2006   DEPRESSION  07/09/2006   Essential hypertension 07/09/2006    PCP: Janifer Meigs, FNP   REFERRING PROVIDER: Neil Balls, MD   REFERRING DIAG: Left TKA  THERAPY DIAG:  Acute pain of left knee  Difficulty in walking, not elsewhere classified  Muscle weakness (generalized)  Rationale for Evaluation and Treatment: Rehabilitation  ONSET DATE: 02-04-23 surgery L TKA had trouble for many years before surgery  SUBJECTIVE:   SUBJECTIVE STATEMENT: 05/07/2023 Pt presents to PT with reports of slight pain moving sit>standing. Otherwise is doing well.    EVAL: Had the surgery on February 04, 2023 of L TKA with Dr Murrell Arrant. I have been doing exercises that I received from my HHPT.  I have been trying to walk a lot and I walk every day. I do not drive. I can stand about 5 minutes right now. I can sit as long as I want. I want to go back to church  PERTINENT HISTORY: Hx of DVT, hyperlipdemia, HTN, Hx of fall but not in last 6 months hepatitis PAIN:  Are you having pain? Yes: NPRS scale: 0/10 in left knee and at worst 10/10 Pain location: Left knee  Pain description: sometimes sharp going down to foot Aggravating factors: standing on leg for longer than 5 minutes, straightening the leg, swelling in left knee, I can't get in the tub without difficulty, mop floor  and household chores, can't shop and carry groceries Relieving factors: not much right now Can't bend over to do grooming and cut my toe nails PRECAUTIONS: Other: post left TKA  RED FLAGS: None   WEIGHT BEARING RESTRICTIONS: Yes WBAT L  FALLS:  Has patient fallen in last 6 months? No  LIVING ENVIRONMENT: Lives with: lives alone Lives in: House/apartment Stairs: Yes: External: 2 steps; on left going up Has following equipment at home: Single point cane, Walker - 2 wheeled, shower chair, and Grab bars  OCCUPATION: retired    used to work at group home  PLOF: Independent  PATIENT GOALS: I want to be able to walk with a cane and  to go back to church.  NEXT MD VISIT: TBD  OBJECTIVE:  Note: Objective measures were completed at Evaluation unless otherwise noted.  DIAGNOSTIC FINDINGS: see imaging  PATIENT SURVEYS:  LEFS 18/80 22.5%  COGNITION: Overall cognitive status: Within functional limits for tasks assessed     SENSATION: WFL  EDEMA:  Circumferential: R knee 44.0cm and Left knee is 48.25 cm  MUSCLE LENGTH: Hamstrings: Right 54 deg; Left 50 deg Thomas test: + bil  POSTURE: rounded shoulders, forward head, flexed trunk , and weight shift right  PALPATION: Well healing TKA L but has a wound opending on medial side of scar  LOWER EXTREMITY ROM:  Active ROM Right eval Left eval Left 03-21-23 Left 03/28/23 Left 04/02/23 Left 04/12/23 Left 04/25/23  Hip flexion         Hip extension         Hip abduction         Hip adduction         Hip internal rotation         Hip external rotation         Knee flexion 133/P 138 99 P 109 A105 108 AA 111 AA 114 AA 114 AROM  Knee extension -5 -11 ext -10 ext      Ankle dorsiflexion         Ankle plantarflexion         Ankle inversion         Ankle eversion          (Blank rows = not tested)  LOWER EXTREMITY MMT:  MMT Right eval Left eval  Hip flexion 4 4-  Hip extension 4 4-  Hip abduction 3+ 3-  Hip adduction    Hip internal rotation    Hip external rotation    Knee flexion 4+ 3-  Knee extension 4 3-  Ankle dorsiflexion    Ankle plantarflexion    Ankle inversion    Ankle eversion     (Blank rows = not tested)  LOWER EXTREMITY SPECIAL TESTS:  NT due to surgery  FUNCTIONAL TESTS:  5 times sit to stand: unable to stand without using hands  30 sec with UE support  3 x 2 minute walk test: 158 ft (462 ft Norm) using rolling walker: 03/28/23: 179 feet  04/18/23: 216 feet  GAIT: Distance walked: 157ft 2 MWT Assistive device utilized: Walker - 2 wheeled Level of assistance: Modified independence Comments: Pt with antalgic gait with 2 wheeled  walker   TREATMENT: [facility] Adult PT Treatment:  DATE: 05/02/23 Nu-step L5 x 5 min UE/LE for functional activity tolerance LAQ 3x10 3# L Sit to stand 3 x 5 from hi- lo table lowered between sets to promote strengthening Supine SLR 2x10 L SAQ 3x10 3# Hooklying clamshell 3x15 blue band Hooklying march 2x20 blue band Gait with SPC 164ft with SPC CGA    [facility] Adult PT Treatment:                                                DATE: 04/30/23 Therapeutic Exercise: nu-step L5 37m while taking subjective and planning session with patient LAQ - 2x10 5# L, 10x 7.5# Seated L LE march 5# 2x10 SLR 10 x 2  Supine clam Blue TB 2x10 Hooklying hip Flexion Blue TB 10 x 2  Nustep L5 UE/LE x 5 minutes   Therapeutic Activity: Amb with SPC 180 ft with CGA  6 inch step up with 1 UE 2x10 STS without UE x 10-elevated mat   [facility] Adult PT Treatment:                                                DATE: 04/25/23 Therapeutic Exercise: LAQ 3x10 3# L Seated L LE march 4# 3x10 SLR 10 x 2  SAQ 4# 10 X 3  Supine clam GTB x 10 Hooklying hip Flexion  GTB 10 x 2  Heel slide AAROM  Therapeutic Activity: Nustep L5 UE/LE x 5 minutes  Amb with SPC 2x175ft with CGA  6 inch step up with 1 UE x 15  STS without UE x 10-elevated mat    PATIENT EDUCATION:  Education details: continue HEP Person educated: Patient Education method: Explanation, Demonstration, Tactile cues, Verbal cues, and Handouts Education comprehension: verbalized understanding, returned demonstration, verbal cues required, tactile cues required, and needs further education  HOME EXERCISE PROGRAM: Access Code: [email or web address]  updated 03-21-23 URL: [email or web address] Date: 03/21/2023 Prepared by: Sharlet Dawson  Exercises - Sit to stand with sink support Movement snack  - 1 x daily - 7 x weekly - 3 sets - 10 reps - Supine Knee Extension Stretch on Towel Roll  - 1 x daily - 7 x weekly - 3  sets - 10 reps - 5 sec  hold - Supine Heel Slide with Strap  - 1 x daily - 7 x weekly - 3 sets - 10 reps - Supine Short Arc Quad  - 1 x daily - 7 x weekly - 3 sets - 10 reps - Seated Hamstring Stretch  - 1-2 x daily - 7 x weekly - 1 sets - 3 reps - 30 sec hold - Long Arc Quad  - 1 x daily - 7 x weekly - 3 sets - 10 reps - Seated Knee Flexion AAROM  - 1 x daily - 7 x weekly - 3 sets - 10 reps  ASSESSMENT:  CLINICAL IMPRESSION: Pt was able to complete all prescribed exercises with no adverse effect. Exercises focused on improving quad and hip flexor strength as well as knee ROM in order to improve comfort and function. Improved sequencing with SPC and gait observed today. Continues to benefit from skilled PT services, will continue to progress quad strength as able.   EVAL- Patient is a 78  y.o. female who was seen today for physical therapy evaluation and treatment for L TKA. Pt has hx of DVT on Eliquis  , sedentary life style and decreased mobility.  Pt lives alone and would like to improve mobility in order to safely live on her own.  Pt would like to be able to safely enter and exit tub and perform household chores safely in home with adequate sleep.  Pt tend to be more sedentary and understands she needs to improve mobility in order to prenvent DVTS.  Pt will benefit from skilled PT to address impairments and maximize functional mobility  OBJECTIVE IMPAIRMENTS: Abnormal gait, decreased activity tolerance, decreased knowledge of use of DME, decreased mobility, difficulty walking, decreased ROM, decreased strength, increased edema, postural dysfunction, obesity, and pain.   ACTIVITY LIMITATIONS: carrying, lifting, bending, standing, squatting, sleeping, stairs, transfers, hygiene/grooming, and locomotion level  PARTICIPATION LIMITATIONS: meal prep, cleaning, laundry, shopping, and church  PERSONAL FACTORS: Hx of DVT, hyperlipdemia, HTN, Hx of fall but not in last 6 months are also affecting  patient's functional outcome. hepatitis  REHAB POTENTIAL: Good  CLINICAL DECISION MAKING: Evolving/moderate complexity  EVALUATION COMPLEXITY: Moderate   GOALS: Goals reviewed with patient? Yes  SHORT TERM GOALS: Target date: 04-09-23  Pt will be independent with initial HEP Baseline:no knowledge Goal status: PARTIALLY  MET  2.  AROM of knee extension -10 to 100 flexion to increase mobility for transitional movements Baseline: eval left flexion 99 degrees 04/12/23: AAROM 114 Goal status: ONGOING  3.  Demonstrate and verbalize understanding of condition management including RICE, positioning, use of A.D., HEP Baseline: using 2 wheel walker at eval 04/12/23: using elevation Goal status: MET    LONG TERM GOALS: Target date: 05-14-23  Pt will be independent with advanced HEP Baseline: no knowledge Goal status: INITIAL  2.  Pt will be able demostrate 5 x STS in uncer 15 seconds to show improved LE strength. Baseline: Pt with heavy use of arms for 30 sec STS 3 x only Goal status: INITIAL  3.  Pt will be able to use LRAD in order to walk and return to church Baseline: using 2 wheeled walker  04/18/23: SPC 62 feet SBA Goal status: INITIAL  4.  Pt will be educated and demonstrate ability to rise from ground to standing to encourage safety in home living alone Baseline: unable to perform floor to stand x fer Goal status: INITIAL  5.  Pt will improve her L knee flexion to  >/= 120 degrees and extension to </= 5 degrees with </= 2/10 pain for a more functional and efficient gait pattern Baseline: See AROM chart Goal status: INITIAL  6.  Patient will report improved functional level on LEFS >/= 50/80 in order to allow for return to prior exercise level Baseline: eval 18/80 22.5 % Goal status: INITIAL  PLAN:  PT FREQUENCY: 2x/week  PT DURATION: 8 weeks  PLANNED INTERVENTIONS: 97164- PT Re-evaluation, 97110-Therapeutic exercises, 97530- Therapeutic activity, 97112-  Neuromuscular re-education, 97535- Self Care, [id number]- Manual therapy, [id number]- Gait training, [phone]- Electrical stimulation (manual), Patient/Family education, Balance training, Stair training, Taping, Dry Needling, Joint mobilization, DME instructions, Cryotherapy, and Moist heat  PLAN FOR NEXT SESSION: HEP, manual for increasing knee flex / ext.did pt bring her SPC from home?  Ivor Mars PT  05/07/23 3:55 PM

## 2023-05-09 ENCOUNTER — Ambulatory Visit: Attending: Orthopedic Surgery

## 2023-05-09 DIAGNOSIS — M6281 Muscle weakness (generalized): Secondary | ICD-10-CM | POA: Diagnosis present

## 2023-05-09 DIAGNOSIS — M25562 Pain in left knee: Secondary | ICD-10-CM

## 2023-05-09 DIAGNOSIS — R262 Difficulty in walking, not elsewhere classified: Secondary | ICD-10-CM

## 2023-05-09 NOTE — Therapy (Signed)
 OUTPATIENT PHYSICAL THERAPY TREATMENT   Patient Name: Tina Bryant MRN: [id number] DOB:1945-10-13, 78 y.o., female Today's Date: 05/10/2023  END OF SESSION:  PT End of Session - 05/09/23 1302     Visit Number 13    Number of Visits 24    Date for PT Re-Evaluation 06/21/23    Authorization Type [employer]    Authorization Time Period 03/21/23-06/19/23    Authorization - Visit Number 12    Authorization - Number of Visits 13    PT Start Time 1315    PT Stop Time 1355    PT Time Calculation (min) 40 min    Activity Tolerance Patient limited by pain;Patient tolerated treatment well    Behavior During Therapy [facility] for tasks assessed/performed                  Past Medical History:  Diagnosis Date   Arthritis    Chronic kidney disease    CKD3   DVT (deep venous thrombosis) (HCC)    Hepatitis    b   Hypertension    Pneumonia    Pre-diabetes    Past Surgical History:  Procedure Laterality Date   EYE SURGERY Left    strabismus surgery   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   Procedure: TOTAL KNEE ARTHROPLASTY, OPEN REDUCTION INTERNAL FIXATION OF MEDIAL FEMORAL CONDYLE;  Surgeon: Neil Balls, MD;  Location: [facility];  Service: Orthopedics;  Laterality: Left;   Patient Active Problem List   Diagnosis Date Noted   Primary osteoarthritis of left knee 02/03/2023   DVT (deep venous thrombosis) (HCC) 11/01/2022   Fall at home, initial encounter 11/01/2022   CAP (community acquired pneumonia) 11/01/2022   Severe sepsis (HCC) 11/01/2022   AKI (acute kidney injury) (HCC) 11/01/2022   Acute metabolic encephalopathy 11/01/2022   CKD (chronic kidney disease) stage 3, GFR 30-59 ml/min (HCC) 11/01/2022   Elevated troponin 11/01/2022   Non-insulin  treated type 2 diabetes mellitus (HCC) 11/01/2022   High anion gap metabolic acidosis 11/01/2022   Acute deep vein thrombosis (DVT) of femoral vein of left lower extremity (HCC) 06/08/2022   Hyperlipidemia 07/09/2006    Microcytic anemia 07/09/2006   DEPRESSION 07/09/2006   Essential hypertension 07/09/2006    PCP: Janifer Meigs, FNP   REFERRING PROVIDER: Neil Balls, MD   REFERRING DIAG: Left TKA  THERAPY DIAG:  Acute pain of left knee  Difficulty in walking, not elsewhere classified  Muscle weakness (generalized)  Rationale for Evaluation and Treatment: Rehabilitation  ONSET DATE: 02-04-23 surgery L TKA had trouble for many years before surgery  SUBJECTIVE:   SUBJECTIVE STATEMENT: 05/10/2023 Pt presents to PT with reports of no current knee pain. Has been compliant with HEP.    EVAL: Had the surgery on February 04, 2023 of L TKA with Dr Murrell Arrant. I have been doing exercises that I received from my HHPT.  I have been trying to walk a lot and I walk every day. I do not drive. I can stand about 5 minutes right now. I can sit as long as I want. I want to go back to church.  PERTINENT HISTORY: Hx of DVT, hyperlipdemia, HTN, Hx of fall but not in last 6 months hepatitis PAIN:  Are you having pain?  Yes: NPRS scale: 0/10 in left knee and at worst 10/10 Pain location: Left knee  Pain description: sometimes sharp going down to foot Aggravating factors: standing on leg for longer than 5 minutes, straightening the leg, swelling in  left knee, I can't get in the tub without difficulty, mop floor and household chores, can't shop and carry groceries Relieving factors: not much right now Can't bend over to do grooming and cut my toe nails PRECAUTIONS: Other: post left TKA  RED FLAGS: None   WEIGHT BEARING RESTRICTIONS: Yes WBAT L  FALLS:  Has patient fallen in last 6 months? No  LIVING ENVIRONMENT: Lives with: lives alone Lives in: House/apartment Stairs: Yes: External: 2 steps; on left going up Has following equipment at home: Single point cane, Walker - 2 wheeled, shower chair, and Grab bars  OCCUPATION: retired    used to work at group home  PLOF: Independent  PATIENT GOALS: I  want to be able to walk with a cane and to go back to church.  NEXT MD VISIT: TBD  OBJECTIVE:  Note: Objective measures were completed at Evaluation unless otherwise noted.  DIAGNOSTIC FINDINGS: see imaging  PATIENT SURVEYS:  LEFS 18/80 22.5%  COGNITION: Overall cognitive status: Within functional limits for tasks assessed     SENSATION: WFL  EDEMA:  Circumferential: R knee 44.0cm and Left knee is 48.25 cm  MUSCLE LENGTH: Hamstrings: Right 54 deg; Left 50 deg Thomas test: + bil  POSTURE: rounded shoulders, forward head, flexed trunk , and weight shift right  PALPATION: Well healing TKA L but has a wound opending on medial side of scar  LOWER EXTREMITY ROM:  Active ROM Right eval Left eval Left 03-21-23 Left 03/28/23 Left 04/02/23 Left 04/12/23 Left 04/25/23  Hip flexion         Hip extension         Hip abduction         Hip adduction         Hip internal rotation         Hip external rotation         Knee flexion 133/P 138 99 P 109 A105 108 AA 111 AA 114 AA 114 AROM  Knee extension -5 -11 ext -10 ext      Ankle dorsiflexion         Ankle plantarflexion         Ankle inversion         Ankle eversion          (Blank rows = not tested)  LOWER EXTREMITY MMT:  MMT Right eval Left eval  Hip flexion 4 4-  Hip extension 4 4-  Hip abduction 3+ 3-  Hip adduction    Hip internal rotation    Hip external rotation    Knee flexion 4+ 3-  Knee extension 4 3-  Ankle dorsiflexion    Ankle plantarflexion    Ankle inversion    Ankle eversion     (Blank rows = not tested)  LOWER EXTREMITY SPECIAL TESTS:  NT due to surgery  FUNCTIONAL TESTS:  5 times sit to stand: unable to stand without using hands  30 sec with UE support  3 x 2 minute walk test: 158 ft (462 ft Norm) using rolling walker: 03/28/23: 179 feet  04/18/23: 216 feet  GAIT: Distance walked: 169ft 2 MWT Assistive device utilized: Walker - 2 wheeled Level of assistance: Modified independence Comments:  Pt with antalgic gait with 2 wheeled walker   TREATMENT: [facility] Adult PT Treatment:  DATE: 05/09/23 Nu-step L5 x 5 min UE/LE for functional activity tolerance LAQ 3x10 3# L Sit to stand 3x5 from hi-lo table lowered between sets to promote strengthening Gait with SPC 2x128ft with SPC CGA  Supine SLR 2x10 L SAQ 3x10 3# Hooklying clamshell 3x15 black band  [facility] Adult PT Treatment:                                                DATE: 05/02/23 Nu-step L5 x 5 min UE/LE for functional activity tolerance LAQ 3x10 3# L Sit to stand 3 x 5 from hi- lo table lowered between sets to promote strengthening Supine SLR 2x10 L SAQ 3x10 3# Hooklying clamshell 3x15 blue band Hooklying march 2x20 blue band Gait with SPC 175ft with SPC CGA   [facility] Adult PT Treatment:                                                DATE: 04/30/23 Therapeutic Exercise: nu-step L5 77m while taking subjective and planning session with patient LAQ - 2x10 5# L, 10x 7.5# Seated L LE march 5# 2x10 SLR 10 x 2  Supine clam Blue TB 2x10 Hooklying hip Flexion Blue TB 10 x 2  Therapeutic Activity: Amb with SPC 180 ft with CGA  6 inch step up with 1 UE 2x10 STS without UE x 10-elevated mat   [facility] Adult PT Treatment:                                                DATE: 04/25/23 Therapeutic Exercise: LAQ 3x10 3# L Seated L LE march 4# 3x10 SLR 10 x 2  SAQ 4# 10 X 3  Supine clam GTB x 10 Hooklying hip Flexion  GTB 10 x 2  Heel slide AAROM  Therapeutic Activity: Nustep L5 UE/LE x 5 minutes  Amb with SPC 2x137ft with CGA  6 inch step up with 1 UE x 15  STS without UE x 10-elevated mat    PATIENT EDUCATION:  Education details: continue HEP Person educated: Patient Education method: Explanation, Demonstration, Tactile cues, Verbal cues, and Handouts Education comprehension: verbalized understanding, returned demonstration, verbal cues required, tactile cues required, and needs  further education  HOME EXERCISE PROGRAM: Access Code: [email or web address]  updated 03-21-23 URL: [email or web address] Date: 03/21/2023 Prepared by: Sharlet Dawson  Exercises - Sit to stand with sink support Movement snack  - 1 x daily - 7 x weekly - 3 sets - 10 reps - Supine Knee Extension Stretch on Towel Roll  - 1 x daily - 7 x weekly - 3 sets - 10 reps - 5 sec  hold - Supine Heel Slide with Strap  - 1 x daily - 7 x weekly - 3 sets - 10 reps - Supine Short Arc Quad  - 1 x daily - 7 x weekly - 3 sets - 10 reps - Seated Hamstring Stretch  - 1-2 x daily - 7 x weekly - 1 sets - 3 reps - 30 sec hold - Long Arc Quad  - 1 x daily - 7 x weekly - 3  sets - 10 reps - Seated Knee Flexion AAROM  - 1 x daily - 7 x weekly - 3 sets - 10 reps  ASSESSMENT:  CLINICAL IMPRESSION: Pt was able to complete all prescribed exercises with no adverse effect. Exercises focused on improving quad and hip flexor strength as well as knee ROM in order to improve comfort and function. Improved distance and gait with SPC today. Continues to benefit from skilled PT services, will continue to progress quad strength as able.   EVAL- Patient is a 78 y.o. female who was seen today for physical therapy evaluation and treatment for L TKA. Pt has hx of DVT on Eliquis  , sedentary life style and decreased mobility.  Pt lives alone and would like to improve mobility in order to safely live on her own.  Pt would like to be able to safely enter and exit tub and perform household chores safely in home with adequate sleep.  Pt tend to be more sedentary and understands she needs to improve mobility in order to prenvent DVTS.  Pt will benefit from skilled PT to address impairments and maximize functional mobility  OBJECTIVE IMPAIRMENTS: Abnormal gait, decreased activity tolerance, decreased knowledge of use of DME, decreased mobility, difficulty walking, decreased ROM, decreased strength, increased edema, postural dysfunction, obesity,  and pain.   ACTIVITY LIMITATIONS: carrying, lifting, bending, standing, squatting, sleeping, stairs, transfers, hygiene/grooming, and locomotion level  PARTICIPATION LIMITATIONS: meal prep, cleaning, laundry, shopping, and church  PERSONAL FACTORS: Hx of DVT, hyperlipdemia, HTN, Hx of fall but not in last 6 months are also affecting patient's functional outcome. hepatitis  REHAB POTENTIAL: Good  CLINICAL DECISION MAKING: Evolving/moderate complexity  EVALUATION COMPLEXITY: Moderate   GOALS: Goals reviewed with patient? Yes  SHORT TERM GOALS: Target date: 04-09-23  Pt will be independent with initial HEP Baseline:no knowledge Goal status: PARTIALLY  MET  2.  AROM of knee extension -10 to 100 flexion to increase mobility for transitional movements Baseline: eval left flexion 99 degrees 04/12/23: AAROM 114 Goal status: ONGOING  3.  Demonstrate and verbalize understanding of condition management including RICE, positioning, use of A.D., HEP Baseline: using 2 wheel walker at eval 04/12/23: using elevation Goal status: MET    LONG TERM GOALS: Target date: 06/21/2023  Pt will be independent with advanced HEP Baseline: no knowledge Goal status: INITIAL  2.  Pt will be able demostrate 5 x STS in uncer 15 seconds to show improved LE strength. Baseline: Pt with heavy use of arms for 30 sec STS 3 x only Goal status: INITIAL  3.  Pt will be able to use LRAD in order to walk and return to church Baseline: using 2 wheeled walker  04/18/23: SPC 62 feet SBA Goal status: INITIAL  4.  Pt will be educated and demonstrate ability to rise from ground to standing to encourage safety in home living alone Baseline: unable to perform floor to stand x fer Goal status: INITIAL  5.  Pt will improve her L knee flexion to  >/= 120 degrees and extension to </= 5 degrees with </= 2/10 pain for a more functional and efficient gait pattern Baseline: See AROM chart Goal status: INITIAL  6.  Patient  will report improved functional level on LEFS >/= 50/80 in order to allow for return to prior exercise level Baseline: eval 18/80 22.5 % Goal status: INITIAL  PLAN:  PT FREQUENCY: 2x/week  PT DURATION: 8 weeks  PLANNED INTERVENTIONS: 97164- PT Re-evaluation, 97110-Therapeutic exercises, 97530- Therapeutic activity, 97112- Neuromuscular  re-education, [phone]- Self Care, 84696- Manual therapy, [phone]- Gait training, [phone]- Electrical stimulation (manual), Patient/Family education, Balance training, Stair training, Taping, Dry Needling, Joint mobilization, DME instructions, Cryotherapy, and Moist heat  PLAN FOR NEXT SESSION: HEP, manual for increasing knee flex / ext.did pt bring her SPC from home?  Elonda Hale Kimothy Kishimoto PT  05/10/23 7:53 AM

## 2023-05-13 ENCOUNTER — Encounter (HOSPITAL_BASED_OUTPATIENT_CLINIC_OR_DEPARTMENT_OTHER): Attending: Internal Medicine | Admitting: Internal Medicine

## 2023-05-13 DIAGNOSIS — L97828 Non-pressure chronic ulcer of other part of left lower leg with other specified severity: Secondary | ICD-10-CM | POA: Insufficient documentation

## 2023-05-13 DIAGNOSIS — S81002A Unspecified open wound, left knee, initial encounter: Secondary | ICD-10-CM | POA: Insufficient documentation

## 2023-05-13 DIAGNOSIS — S81002D Unspecified open wound, left knee, subsequent encounter: Secondary | ICD-10-CM

## 2023-05-13 DIAGNOSIS — X58XXXA Exposure to other specified factors, initial encounter: Secondary | ICD-10-CM | POA: Insufficient documentation

## 2023-05-15 ENCOUNTER — Ambulatory Visit

## 2023-05-15 DIAGNOSIS — M25562 Pain in left knee: Secondary | ICD-10-CM | POA: Diagnosis not present

## 2023-05-15 DIAGNOSIS — M6281 Muscle weakness (generalized): Secondary | ICD-10-CM

## 2023-05-15 DIAGNOSIS — R262 Difficulty in walking, not elsewhere classified: Secondary | ICD-10-CM

## 2023-05-15 NOTE — Therapy (Signed)
 OUTPATIENT PHYSICAL THERAPY TREATMENT   Patient Name: Meridian Sou MRN: [id number] DOB:Mar 04, 1945, 78 y.o., female Today's Date: 05/15/2023  END OF SESSION:  PT End of Session - 05/15/23 1050     Visit Number 14    Number of Visits 24    Date for PT Re-Evaluation 06/21/23    Authorization Type [place] [employer]    Authorization Time Period 03/21/23-06/19/23    Authorization - Visit Number 13    Authorization - Number of Visits 13    PT Start Time 1100    Activity Tolerance Patient limited by pain;Patient tolerated treatment well    Behavior During Therapy [facility] for tasks assessed/performed                   Past Medical History:  Diagnosis Date   Arthritis    Chronic kidney disease    CKD3   DVT (deep venous thrombosis) (HCC)    Hepatitis    b   Hypertension    Pneumonia    Pre-diabetes    Past Surgical History:  Procedure Laterality Date   EYE SURGERY Left    strabismus surgery   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   Procedure: TOTAL KNEE ARTHROPLASTY, OPEN REDUCTION INTERNAL FIXATION OF MEDIAL FEMORAL CONDYLE;  Surgeon: Neil Balls, MD;  Location: [facility];  Service: Orthopedics;  Laterality: Left;   Patient Active Problem List   Diagnosis Date Noted   Primary osteoarthritis of left knee 02/03/2023   DVT (deep venous thrombosis) (HCC) 11/01/2022   Fall at home, initial encounter 11/01/2022   CAP (community acquired pneumonia) 11/01/2022   Severe sepsis (HCC) 11/01/2022   AKI (acute kidney injury) (HCC) 11/01/2022   Acute metabolic encephalopathy 11/01/2022   CKD (chronic kidney disease) stage 3, GFR 30-59 ml/min (HCC) 11/01/2022   Elevated troponin 11/01/2022   Non-insulin  treated type 2 diabetes mellitus (HCC) 11/01/2022   High anion gap metabolic acidosis 11/01/2022   Acute deep vein thrombosis (DVT) of femoral vein of left lower extremity (HCC) 06/08/2022   Hyperlipidemia 07/09/2006   Microcytic anemia 07/09/2006   DEPRESSION 07/09/2006    Essential hypertension 07/09/2006    PCP: Janifer Meigs, FNP   REFERRING PROVIDER: Neil Balls, MD   REFERRING DIAG: Left TKA  THERAPY DIAG:  No diagnosis found.  Rationale for Evaluation and Treatment: Rehabilitation  ONSET DATE: 02-04-23 surgery L TKA had trouble for many years before surgery  SUBJECTIVE:   SUBJECTIVE STATEMENT: Pt presents to PT with no pain. Notes fair HEP compliance but continues to fatigue quickly with walking.   EVAL: Had the surgery on February 04, 2023 of L TKA with Dr Murrell Arrant. I have been doing exercises that I received from my HHPT.  I have been trying to walk a lot and I walk every day. I do not drive. I can stand about 5 minutes right now. I can sit as long as I want. I want to go back to church.  PERTINENT HISTORY: Hx of DVT, hyperlipdemia, HTN, Hx of fall but not in last 6 months hepatitis PAIN:  Are you having pain?  Yes: NPRS scale: 0/10 in left knee and at worst 10/10 Pain location: Left knee  Pain description: sometimes sharp going down to foot Aggravating factors: standing on leg for longer than 5 minutes, straightening the leg, swelling in left knee, I can't get in the tub without difficulty, mop floor and household chores, can't shop and carry groceries Relieving factors: not much right now Can't bend  over to do grooming and cut my toe nails PRECAUTIONS: Other: post left TKA  RED FLAGS: None   WEIGHT BEARING RESTRICTIONS: Yes WBAT L  FALLS:  Has patient fallen in last 6 months? No  LIVING ENVIRONMENT: Lives with: lives alone Lives in: House/apartment Stairs: Yes: External: 2 steps; on left going up Has following equipment at home: Single point cane, Walker - 2 wheeled, shower chair, and Grab bars  OCCUPATION: retired    used to work at group home  PLOF: Independent  PATIENT GOALS: I want to be able to walk with a cane and to go back to church.  NEXT MD VISIT: TBD  OBJECTIVE:  Note: Objective measures were completed  at Evaluation unless otherwise noted.  DIAGNOSTIC FINDINGS: see imaging  PATIENT SURVEYS:  LEFS 18/80 22.5% 05/15/2023: 30/80 - 38% function  COGNITION: Overall cognitive status: Within functional limits for tasks assessed     SENSATION: WFL  EDEMA:  Circumferential: R knee 44.0cm and Left knee is 48.25 cm  MUSCLE LENGTH: Hamstrings: Right 54 deg; Left 50 deg Thomas test: + bil  POSTURE: rounded shoulders, forward head, flexed trunk , and weight shift right  PALPATION: Well healing TKA L but has a wound opending on medial side of scar  LOWER EXTREMITY ROM:  Active ROM Right eval Left eval Left 03-21-23 Left 03/28/23 Left 04/02/23 Left 04/12/23 Left 04/25/23 Left 05/15/23  Hip flexion          Hip extension          Hip abduction          Hip adduction          Hip internal rotation          Hip external rotation          Knee flexion 133/P 138 99 P 109 A105 108 AA 111 AA 114 AA 114 AROM 110 AROM  Knee extension -5 -11 ext -10 ext     0 AROM  Ankle dorsiflexion          Ankle plantarflexion          Ankle inversion          Ankle eversion           (Blank rows = not tested)  LOWER EXTREMITY MMT:  MMT Right eval Left eval  Hip flexion 4 4-  Hip extension 4 4-  Hip abduction 3+ 3-  Hip adduction    Hip internal rotation    Hip external rotation    Knee flexion 4+ 3-  Knee extension 4 3-  Ankle dorsiflexion    Ankle plantarflexion    Ankle inversion    Ankle eversion     (Blank rows = not tested)  LOWER EXTREMITY SPECIAL TESTS:  NT due to surgery  FUNCTIONAL TESTS:  5 times sit to stand: unable to stand without using hands 30 sec with UE support 3 x 05/15/2023: 33 seconds no UE - able to perform 5 today 2 minute walk test: 158 ft (462 ft Norm) using rolling walker: 03/28/23: 179 feet  04/18/23: 216 feet  GAIT: Distance walked: 176ft 2 MWT Assistive device utilized: Walker - 2 wheeled Level of assistance: Modified independence Comments: Pt with antalgic  gait with 2 wheeled walker   TREATMENT: [facility] Adult PT Treatment:  DATE: 05/15/23 Nu-step L5 x 4 min UE/LE for functional activity tolerance LAQ 3x10 3# L Supine SLR 2x10 L Hooklying clamshell 2x20 blue band Hooklying march 2x20 blue band Step ups x 10 L stance 6in step - 1 UE hold Sit to stand x 10 from hi-lo table lowered between sets to promote strengthening Assessment of tests/measures, goals, and outcomes for re-certification  [facility] Adult PT Treatment:                                                DATE: 05/09/23 Nu-step L5 x 5 min UE/LE for functional activity tolerance LAQ 3x10 3# L Sit to stand 3x5 from hi-lo table lowered between sets to promote strengthening Gait with SPC 2x156ft with SPC CGA  Supine SLR 2x10 L SAQ 3x10 3# Hooklying clamshell 3x15 black band  [facility] Adult PT Treatment:                                                DATE: 05/02/23 Nu-step L5 x 5 min UE/LE for functional activity tolerance LAQ 3x10 3# L Sit to stand 3 x 5 from hi- lo table lowered between sets to promote strengthening Supine SLR 2x10 L SAQ 3x10 3# Hooklying clamshell 3x15 blue band Hooklying march 2x20 blue band Gait with SPC 191ft with SPC CGA   [facility] Adult PT Treatment:                                                DATE: 04/30/23 Therapeutic Exercise: nu-step L5 23m while taking subjective and planning session with patient LAQ - 2x10 5# L, 10x 7.5# Seated L LE march 5# 2x10 SLR 10 x 2  Supine clam Blue TB 2x10 Hooklying hip Flexion Blue TB 10 x 2  Therapeutic Activity: Amb with SPC 180 ft with CGA  6 inch step up with 1 UE 2x10 STS without UE x 10-elevated mat   [facility] Adult PT Treatment:                                                DATE: 04/25/23 Therapeutic Exercise: LAQ 3x10 3# L Seated L LE march 4# 3x10 SLR 10 x 2  SAQ 4# 10 X 3  Supine clam GTB x 10 Hooklying hip Flexion  GTB 10 x 2  Heel slide AAROM  Therapeutic  Activity: Nustep L5 UE/LE x 5 minutes  Amb with SPC 2x127ft with CGA  6 inch step up with 1 UE x 15  STS without UE x 10-elevated mat    PATIENT EDUCATION:  Education details: continue HEP Person educated: Patient Education method: Explanation, Demonstration, Tactile cues, Verbal cues, and Handouts Education comprehension: verbalized understanding, returned demonstration, verbal cues required, tactile cues required, and needs further education  HOME EXERCISE PROGRAM: Access Code: [email or web address]  updated 03-21-23 URL: [email or web address] Date: 03/21/2023 Prepared by: Sharlet Dawson  Exercises - Sit to stand with sink support Movement snack  - 1 x daily -  7 x weekly - 3 sets - 10 reps - Supine Knee Extension Stretch on Towel Roll  - 1 x daily - 7 x weekly - 3 sets - 10 reps - 5 sec  hold - Supine Heel Slide with Strap  - 1 x daily - 7 x weekly - 3 sets - 10 reps - Supine Short Arc Quad  - 1 x daily - 7 x weekly - 3 sets - 10 reps - Seated Hamstring Stretch  - 1-2 x daily - 7 x weekly - 1 sets - 3 reps - 30 sec hold - Long Arc Quad  - 1 x daily - 7 x weekly - 3 sets - 10 reps - Seated Knee Flexion AAROM  - 1 x daily - 7 x weekly - 3 sets - 10 reps  ASSESSMENT:  CLINICAL IMPRESSION: Pt was able to complete all prescribed exercises with no adverse effect. Over the course of PT treatment she has been progressing fairly well with improved ROM post TKA and increased subjective ability assessed via LEFS. Continues to be limited in functional mobility and gait due to weakness and fatigue in L LE. She was able to progress 5xSTS to be able to fully perform 5 reps despite similar time. Overall she has progressed well but continues to be operating below PLOF.   EVAL- Patient is a 78 y.o. female who was seen today for physical therapy evaluation and treatment for L TKA. Pt has hx of DVT on Eliquis  , sedentary life style and decreased mobility.  Pt lives alone and would like to improve  mobility in order to safely live on her own.  Pt would like to be able to safely enter and exit tub and perform household chores safely in home with adequate sleep.  Pt tend to be more sedentary and understands she needs to improve mobility in order to prenvent DVTS.  Pt will benefit from skilled PT to address impairments and maximize functional mobility  OBJECTIVE IMPAIRMENTS: Abnormal gait, decreased activity tolerance, decreased knowledge of use of DME, decreased mobility, difficulty walking, decreased ROM, decreased strength, increased edema, postural dysfunction, obesity, and pain.   ACTIVITY LIMITATIONS: carrying, lifting, bending, standing, squatting, sleeping, stairs, transfers, hygiene/grooming, and locomotion level  PARTICIPATION LIMITATIONS: meal prep, cleaning, laundry, shopping, and church  PERSONAL FACTORS: Hx of DVT, hyperlipdemia, HTN, Hx of fall but not in last 6 months are also affecting patient's functional outcome. hepatitis  REHAB POTENTIAL: Good  CLINICAL DECISION MAKING: Evolving/moderate complexity  EVALUATION COMPLEXITY: Moderate   GOALS: Goals reviewed with patient? Yes  SHORT TERM GOALS: Target date: 04-09-23  Pt will be independent with initial HEP Baseline:no knowledge Goal status: PARTIALLY  MET  2.  AROM of knee extension -10 to 100 flexion to increase mobility for transitional movements Baseline: eval left flexion 99 degrees 04/12/23: AAROM 114 05/15/2023: see ROM chart Goal status: MET  3.  Demonstrate and verbalize understanding of condition management including RICE, positioning, use of A.D., HEP Baseline: using 2 wheel walker at eval 04/12/23: using elevation Goal status: MET    LONG TERM GOALS: Target date: 06/21/2023  Pt will be independent with advanced HEP Baseline: no knowledge Goal status: IN PROGRESS  2.  Pt will be able demostrate 5 x STS in uncer 15 seconds to show improved LE strength. Baseline: Pt with heavy use of arms for 30 sec  STS 3 x only 05/15/2023: 33 seconds no UE - able to perform 5 today Goal status: IN PROGRESS  3.  Pt will be able to use LRAD in order to walk and return to church Baseline: using 2 wheeled walker  04/18/23: SPC 62 feet SBA Goal status: IN PROGRESS  4.  Pt will be educated and demonstrate ability to rise from ground to standing to encourage safety in home living alone Baseline: unable to perform floor to stand x fer Goal status: INITIAL  5.  Pt will improve her L knee flexion to  >/= 120 degrees and extension to </= 5 degrees with </= 2/10 pain for a more functional and efficient gait pattern Baseline: See AROM chart Goal status: INITIAL  6.  Patient will report improved functional level on LEFS >/= 50/80 in order to allow for return to prior exercise level Baseline: eval 18/80 22.5% 05/15/2023: 30/80 - 38% function Goal status: IN PROGRESS  PLAN:  PT FREQUENCY: 2x/week  PT DURATION: 8 weeks  PLANNED INTERVENTIONS: 97164- PT Re-evaluation, 97110-Therapeutic exercises, 97530- Therapeutic activity, 97112- Neuromuscular re-education, 97535- Self Care, 19147- Manual therapy, [id number]- Gait training, [phone]- Electrical stimulation (manual), Patient/Family education, Balance training, Stair training, Taping, Dry Needling, Joint mobilization, DME instructions, Cryotherapy, and Moist heat  PLAN FOR NEXT SESSION: HEP, manual for increasing knee flex / ext.did pt bring her SPC from home?  Ivor Mars PT  05/15/23 10:51 AM

## 2023-05-17 ENCOUNTER — Telehealth: Payer: Self-pay | Admitting: Internal Medicine

## 2023-05-17 NOTE — Progress Notes (Deleted)
 [facility] OFFICE PROGRESS NOTE  Janifer Meigs, FNP [address] [place] [address]  DIAGNOSIS: Left lower extremity deep venous thrombosis of unprovoked etiology except for her sedentary life secondary to old age and decreased immobility. The patient has no previous personal or family history of deep venous thrombosis   PRIOR THERAPY: None  CURRENT THERAPY:  Eliquis  5 mg p.o. twice daily.  She completed 6 months of treatment and currently on Eliquis  2.5 mg p.o. twice daily after left knee replacement.   INTERVAL HISTORY: Jayci Shave 78 y.o. female returns to the clinic today for follow-up visit.  The patient establish care with Dr. Marguerita Shih in July 2024.  She was last seen in the clinic in February 2025.  She has a history of a DVT in the lower extremity.  She is on Eliquis .  She tolerates this well with any abnormal bleeding or bruising.  She denies any signs and symptoms of blood clot at this time including leg swelling, pain, or erythema.  She denies any chest pain, shortness of breath, lightheadedness, or hemoptysis.  Dr. Marguerita Shih recommended arranging for blood work to determine if she needs to continue on anticoagulation.  Dr. Marguerita Shih recommended ordering a hypercoagulable panel after she completes her Eliquis  postsurgery from January.  She is here to review her results. MEDICAL HISTORY: Past Medical History:  Diagnosis Date   Arthritis    Chronic kidney disease    CKD3   DVT (deep venous thrombosis) (HCC)    Hepatitis    b   Hypertension    Pneumonia    Pre-diabetes     ALLERGIES:  is allergic to tramadol and lisinopril.  MEDICATIONS:  Current Outpatient Medications  Medication Sig Dispense Refill   acetaminophen  (TYLENOL ) 500 MG tablet Take 500 mg by mouth every 6 (six) hours as needed for mild pain (pain score 1-3) or moderate pain (pain score 4-6) (Take for back pain).     apixaban  (ELIQUIS ) 2.5 MG TABS tablet Take 1 tablet (2.5 mg total) by  mouth 2 (two) times daily. 42 tablet 0   atorvastatin  (LIPITOR) 40 MG tablet Take 40 mg by mouth daily.     carboxymethylcellulose (REFRESH PLUS) 0.5 % SOLN Place 1-2 drops into both eyes See admin instructions. Instill 2 drops in left eye daily and 1 drop into the right eye daily     Cholecalciferol (VITAMIN D-1000 MAX ST) 25 MCG (1000 UT) tablet Take 1,000 Units by mouth daily.     docusate sodium  (COLACE) 100 MG capsule Take 1 capsule (100 mg total) by mouth 2 (two) times daily. 30 capsule 0   losartan  (COZAAR ) 25 MG tablet Take 25 mg by mouth daily with breakfast.     Multiple Vitamins-Minerals (CENTRUM SILVER PO) Take 1 tablet by mouth daily.     oxyCODONE -acetaminophen  (PERCOCET/ROXICET) 5-325 MG tablet Take 1 tablet by mouth every 4 (four) hours as needed for severe pain (pain score 7-10).     spironolactone -hydrochlorothiazide  (ALDACTAZIDE) 25-25 MG tablet Take 1 tablet by mouth daily.     tiZANidine  (ZANAFLEX ) 2 MG tablet Take 1 tablet (2 mg total) by mouth every 8 (eight) hours as needed for muscle spasms. 40 tablet 0   No current facility-administered medications for this visit.    SURGICAL HISTORY:  Past Surgical History:  Procedure Laterality Date   EYE SURGERY Left    strabismus surgery   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   Procedure: TOTAL KNEE ARTHROPLASTY, OPEN REDUCTION INTERNAL FIXATION OF  MEDIAL FEMORAL CONDYLE;  Surgeon: Neil Balls, MD;  Location: [facility];  Service: Orthopedics;  Laterality: Left;    REVIEW OF SYSTEMS:   Review of Systems  Constitutional: Negative for appetite change, chills, fatigue, fever and unexpected weight change.  HENT:   Negative for mouth sores, nosebleeds, sore throat and trouble swallowing.   Eyes: Negative for eye problems and icterus.  Respiratory: Negative for cough, hemoptysis, shortness of breath and wheezing.   Cardiovascular: Negative for chest pain and leg swelling.  Gastrointestinal: Negative for abdominal pain,  constipation, diarrhea, nausea and vomiting.  Genitourinary: Negative for bladder incontinence, difficulty urinating, dysuria, frequency and hematuria.   Musculoskeletal: Negative for back pain, gait problem, neck pain and neck stiffness.  Skin: Negative for itching and rash.  Neurological: Negative for dizziness, extremity weakness, gait problem, headaches, light-headedness and seizures.  Hematological: Negative for adenopathy. Does not bruise/bleed easily.  Psychiatric/Behavioral: Negative for confusion, depression and sleep disturbance. The patient is not nervous/anxious.     PHYSICAL EXAMINATION:  There were no vitals taken for this visit.  ECOG PERFORMANCE STATUS: {CHL ONC ECOG [id number]  Physical Exam  Constitutional: Oriented to person, place, and time and well-developed, well-nourished, and in no distress. No distress.  HENT:  Head: Normocephalic and atraumatic.  Mouth/Throat: Oropharynx is clear and moist. No oropharyngeal exudate.  Eyes: Conjunctivae are normal. Right eye exhibits no discharge. Left eye exhibits no discharge. No scleral icterus.  Neck: Normal range of motion. Neck supple.  Cardiovascular: Normal rate, regular rhythm, normal heart sounds and intact distal pulses.   Pulmonary/Chest: Effort normal and breath sounds normal. No respiratory distress. No wheezes. No rales.  Abdominal: Soft. Bowel sounds are normal. Exhibits no distension and no mass. There is no tenderness.  Musculoskeletal: Normal range of motion. Exhibits no edema.  Lymphadenopathy:    No cervical adenopathy.  Neurological: Alert and oriented to person, place, and time. Exhibits normal muscle tone. Gait normal. Coordination normal.  Skin: Skin is warm and dry. No rash noted. Not diaphoretic. No erythema. No pallor.  Psychiatric: Mood, memory and judgment normal.  Vitals reviewed.  LABORATORY DATA: Lab Results  Component Value Date   WBC 6.4 01/31/2023   HGB 10.4 (L) 01/31/2023   HCT  34.8 (L) 01/31/2023   MCV 72.3 (L) 01/31/2023   PLT 175 01/31/2023      Chemistry      Component Value Date/Time   NA 137 01/31/2023 1322   K 5.3 (H) 01/31/2023 1322   CL 105 01/31/2023 1322   CO2 22 01/31/2023 1322   BUN 26 (H) 01/31/2023 1322   CREATININE 1.29 (H) 01/31/2023 1322   CREATININE 1.24 (H) 07/18/2022 1130      Component Value Date/Time   CALCIUM  9.3 01/31/2023 1322   ALKPHOS 55 01/31/2023 1322   AST 32 01/31/2023 1322   AST 19 07/18/2022 1130   ALT 22 01/31/2023 1322   ALT 17 07/18/2022 1130   BILITOT 0.8 01/31/2023 1322   BILITOT 0.5 07/18/2022 1130       RADIOGRAPHIC STUDIES:  No results found.   ASSESSMENT/PLAN:  No problem-specific Assessment & Plan notes found for this encounter.   No orders of the defined types were placed in this encounter.    I spent {CHL ONC TIME VISIT - ZOXWR:[id number]} counseling the patient face to face. The total time spent in the appointment was {CHL ONC TIME VISIT - BJYNW:[id number]}.  Emelynn Rance L Fitzpatrick [place], PA-C 05/17/23

## 2023-05-17 NOTE — Telephone Encounter (Signed)
 Rescheduled appointment per provider request. The patient seemed to be slightly understanding of why her appointment was moved (Give enough time for labs to result). I will mail her a reminder as well.

## 2023-05-20 ENCOUNTER — Ambulatory Visit

## 2023-05-20 DIAGNOSIS — R262 Difficulty in walking, not elsewhere classified: Secondary | ICD-10-CM

## 2023-05-20 DIAGNOSIS — M6281 Muscle weakness (generalized): Secondary | ICD-10-CM

## 2023-05-20 DIAGNOSIS — M25562 Pain in left knee: Secondary | ICD-10-CM

## 2023-05-20 NOTE — Therapy (Signed)
 OUTPATIENT PHYSICAL THERAPY TREATMENT   Patient Name: Tina Bryant MRN: [id number] DOB:[birth year], 78 y.o., female Today's Date: 05/20/2023  END OF SESSION:  PT End of Session - 05/20/23 1444     Visit Number 15    Number of Visits 24    Date for PT Re-Evaluation 06/21/23    Authorization Type [employer] [employer]    Authorization Time Period auth submitted    Authorization - Number of Visits 13    PT Start Time 1445    PT Stop Time 1525    PT Time Calculation (min) 40 min    Activity Tolerance Patient limited by pain;Patient tolerated treatment well    Behavior During Therapy [facility] for tasks assessed/performed                    Past Medical History:  Diagnosis Date   Arthritis    Chronic kidney disease    CKD3   DVT (deep venous thrombosis) (HCC)    Hepatitis    b   Hypertension    Pneumonia    Pre-diabetes    Past Surgical History:  Procedure Laterality Date   EYE SURGERY Left    strabismus surgery   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   Procedure: TOTAL KNEE ARTHROPLASTY, OPEN REDUCTION INTERNAL FIXATION OF MEDIAL FEMORAL CONDYLE;  Surgeon: Neil Balls, MD;  Location: [facility];  Service: Orthopedics;  Laterality: Left;   Patient Active Problem List   Diagnosis Date Noted   Primary osteoarthritis of left knee 02/03/2023   DVT (deep venous thrombosis) (HCC) 11/01/2022   Fall at home, initial encounter 11/01/2022   CAP (community acquired pneumonia) 11/01/2022   Severe sepsis (HCC) 11/01/2022   AKI (acute kidney injury) (HCC) 11/01/2022   Acute metabolic encephalopathy 11/01/2022   CKD (chronic kidney disease) stage 3, GFR 30-59 ml/min (HCC) 11/01/2022   Elevated troponin 11/01/2022   Non-insulin  treated type 2 diabetes mellitus (HCC) 11/01/2022   High anion gap metabolic acidosis 11/01/2022   Acute deep vein thrombosis (DVT) of femoral vein of left lower extremity (HCC) 06/08/2022   Hyperlipidemia 07/09/2006   Microcytic anemia 07/09/2006    DEPRESSION 07/09/2006   Essential hypertension 07/09/2006    PCP: Janifer Meigs, FNP   REFERRING PROVIDER: Neil Balls, MD   REFERRING DIAG: Left TKA  THERAPY DIAG:  Acute pain of left knee  Difficulty in walking, not elsewhere classified  Muscle weakness (generalized)  Rationale for Evaluation and Treatment: Rehabilitation  ONSET DATE: 02-04-23 surgery L TKA had trouble for many years before surgery  SUBJECTIVE:   SUBJECTIVE STATEMENT: Pt presents to PT with reports of continued L knee pain. Has been compliant with HEP.   EVAL: Had the surgery on February 04, 2023 of L TKA with Dr Murrell Arrant. I have been doing exercises that I received from my HHPT.  I have been trying to walk a lot and I walk every day. I do not drive. I can stand about 5 minutes right now. I can sit as long as I want. I want to go back to church.  PERTINENT HISTORY: Hx of DVT, hyperlipdemia, HTN, Hx of fall but not in last 6 months hepatitis PAIN:  Are you having pain?  Yes: NPRS scale: 0/10 in left knee and at worst 10/10 Pain location: Left knee  Pain description: sometimes sharp going down to foot Aggravating factors: standing on leg for longer than 5 minutes, straightening the leg, swelling in left knee, I can't get in the  tub without difficulty, mop floor and household chores, can't shop and carry groceries Relieving factors: not much right now Can't bend over to do grooming and cut my toe nails PRECAUTIONS: Other: post left TKA  RED FLAGS: None   WEIGHT BEARING RESTRICTIONS: Yes WBAT L  FALLS:  Has patient fallen in last 6 months? No  LIVING ENVIRONMENT: Lives with: lives alone Lives in: House/apartment Stairs: Yes: External: 2 steps; on left going up Has following equipment at home: Single point cane, Walker - 2 wheeled, shower chair, and Grab bars  OCCUPATION: retired    used to work at group home  PLOF: Independent  PATIENT GOALS: I want to be able to walk with a cane and to go  back to church.  NEXT MD VISIT: TBD  OBJECTIVE:  Note: Objective measures were completed at Evaluation unless otherwise noted.  DIAGNOSTIC FINDINGS: see imaging  PATIENT SURVEYS:  LEFS 18/80 22.5% 05/15/2023: 30/80 - 38% function  COGNITION: Overall cognitive status: Within functional limits for tasks assessed     SENSATION: WFL  EDEMA:  Circumferential: R knee 44.0cm and Left knee is 48.25 cm  MUSCLE LENGTH: Hamstrings: Right 54 deg; Left 50 deg Thomas test: + bil  POSTURE: rounded shoulders, forward head, flexed trunk , and weight shift right  PALPATION: Well healing TKA L but has a wound opending on medial side of scar  LOWER EXTREMITY ROM:  Active ROM Right eval Left eval Left 03-21-23 Left 03/28/23 Left 04/02/23 Left 04/12/23 Left 04/25/23 Left 05/15/23  Hip flexion          Hip extension          Hip abduction          Hip adduction          Hip internal rotation          Hip external rotation          Knee flexion 133/P 138 99 P 109 A105 108 AA 111 AA 114 AA 114 AROM 110 AROM  Knee extension -5 -11 ext -10 ext     0 AROM  Ankle dorsiflexion          Ankle plantarflexion          Ankle inversion          Ankle eversion           (Blank rows = not tested)  LOWER EXTREMITY MMT:  MMT Right eval Left eval  Hip flexion 4 4-  Hip extension 4 4-  Hip abduction 3+ 3-  Hip adduction    Hip internal rotation    Hip external rotation    Knee flexion 4+ 3-  Knee extension 4 3-  Ankle dorsiflexion    Ankle plantarflexion    Ankle inversion    Ankle eversion     (Blank rows = not tested)  LOWER EXTREMITY SPECIAL TESTS:  NT due to surgery  FUNCTIONAL TESTS:  5 times sit to stand: unable to stand without using hands 30 sec with UE support 3 x 05/15/2023: 33 seconds no UE - able to perform 5 today 2 minute walk test: 158 ft (462 ft Norm) using rolling walker: 03/28/23: 179 feet  04/18/23: 216 feet  GAIT: Distance walked: 156ft 2 MWT Assistive device  utilized: Walker - 2 wheeled Level of assistance: Modified independence Comments: Pt with antalgic gait with 2 wheeled walker   TREATMENT: [facility] Adult PT Treatment:  DATE: 05/20/23 Nu-step L5 x 4 min UE/LE for functional activity tolerance LAQ 3x10 4# L STS 3x5 - low table with UE Ambulated 181ft x 2 with SPC in R hand CGA  Supine QS x 10 - 5" hold Supine SLR 3x10 L SAQ 3x10 L 4# Hooklying clamshell 2x20 blue band  [facility] Adult PT Treatment:                                                DATE: 05/15/23 Nu-step L5 x 4 min UE/LE for functional activity tolerance LAQ 3x10 3# L Supine SLR 2x10 L Hooklying clamshell 2x20 blue band Hooklying march 2x20 blue band Step ups x 10 L stance 6in step - 1 UE hold Sit to stand x 10 from hi-lo table lowered between sets to promote strengthening Assessment of tests/measures, goals, and outcomes for re-certification  [facility] Adult PT Treatment:                                                DATE: 05/09/23 Nu-step L5 x 5 min UE/LE for functional activity tolerance LAQ 3x10 3# L Sit to stand 3x5 from hi-lo table lowered between sets to promote strengthening Gait with SPC 2x19ft with SPC CGA  Supine SLR 2x10 L SAQ 3x10 3# Hooklying clamshell 3x15 black band  [facility] Adult PT Treatment:                                                DATE: 05/02/23 Nu-step L5 x 5 min UE/LE for functional activity tolerance LAQ 3x10 3# L Sit to stand 3 x 5 from hi- lo table lowered between sets to promote strengthening Supine SLR 2x10 L SAQ 3x10 3# Hooklying clamshell 3x15 blue band Hooklying march 2x20 blue band Gait with SPC 180ft with SPC CGA   [facility] Adult PT Treatment:                                                DATE: 04/30/23 Therapeutic Exercise: nu-step L5 82m while taking subjective and planning session with patient LAQ - 2x10 5# L, 10x 7.5# Seated L LE march 5# 2x10 SLR 10 x 2  Supine clam Blue TB 2x10 Hooklying  hip Flexion Blue TB 10 x 2  Therapeutic Activity: Amb with SPC 180 ft with CGA  6 inch step up with 1 UE 2x10 STS without UE x 10-elevated mat   [facility] Adult PT Treatment:                                                DATE: 04/25/23 Therapeutic Exercise: LAQ 3x10 3# L Seated L LE march 4# 3x10 SLR 10 x 2  SAQ 4# 10 X 3  Supine clam GTB x 10 Hooklying hip Flexion  GTB 10 x 2  Heel slide AAROM  Therapeutic Activity: Nustep L5 UE/LE x 5 minutes  Amb with SPC 2x146ft with CGA  6 inch step up with 1 UE x 15  STS without UE x 10-elevated mat    PATIENT EDUCATION:  Education details: continue HEP Person educated: Patient Education method: Explanation, Demonstration, Tactile cues, Verbal cues, and Handouts Education comprehension: verbalized understanding, returned demonstration, verbal cues required, tactile cues required, and needs further education  HOME EXERCISE PROGRAM: Access Code: [email or web address]  updated 03-21-23 URL: [email or web address] Date: 03/21/2023 Prepared by: Sharlet Dawson  Exercises - Sit to stand with sink support Movement snack  - 1 x daily - 7 x weekly - 3 sets - 10 reps - Supine Knee Extension Stretch on Towel Roll  - 1 x daily - 7 x weekly - 3 sets - 10 reps - 5 sec  hold - Supine Heel Slide with Strap  - 1 x daily - 7 x weekly - 3 sets - 10 reps - Supine Short Arc Quad  - 1 x daily - 7 x weekly - 3 sets - 10 reps - Seated Hamstring Stretch  - 1-2 x daily - 7 x weekly - 1 sets - 3 reps - 30 sec hold - Long Arc Quad  - 1 x daily - 7 x weekly - 3 sets - 10 reps - Seated Knee Flexion AAROM  - 1 x daily - 7 x weekly - 3 sets - 10 reps  ASSESSMENT:  CLINICAL IMPRESSION: Pt was able to complete all prescribed exercises with no adverse effect. Over the course of PT treatment she has been progressing fairly well with improved ROM post TKA and increased subjective ability assessed via LEFS. Continues to be limited in functional mobility and gait due to  weakness and fatigue in L LE. She was able to progress 5xSTS to be able to fully perform 5 reps despite similar time last session. Overall she has progressed well but continues to be operating below PLOF. She continues to require skilled PT services, will continue to progress as able per POC.    EVAL- Patient is a 78 y.o. female who was seen today for physical therapy evaluation and treatment for L TKA. Pt has hx of DVT on Eliquis  , sedentary life style and decreased mobility.  Pt lives alone and would like to improve mobility in order to safely live on her own.  Pt would like to be able to safely enter and exit tub and perform household chores safely in home with adequate sleep.  Pt tend to be more sedentary and understands she needs to improve mobility in order to prenvent DVTS.  Pt will benefit from skilled PT to address impairments and maximize functional mobility  OBJECTIVE IMPAIRMENTS: Abnormal gait, decreased activity tolerance, decreased knowledge of use of DME, decreased mobility, difficulty walking, decreased ROM, decreased strength, increased edema, postural dysfunction, obesity, and pain.   ACTIVITY LIMITATIONS: carrying, lifting, bending, standing, squatting, sleeping, stairs, transfers, hygiene/grooming, and locomotion level  PARTICIPATION LIMITATIONS: meal prep, cleaning, laundry, shopping, and church  PERSONAL FACTORS: Hx of DVT, hyperlipdemia, HTN, Hx of fall but not in last 6 months are also affecting patient's functional outcome. hepatitis  REHAB POTENTIAL: Good  CLINICAL DECISION MAKING: Evolving/moderate complexity  EVALUATION COMPLEXITY: Moderate   GOALS: Goals reviewed with patient? Yes  SHORT TERM GOALS: Target date: 04-09-23  Pt will be independent with initial HEP Baseline:no knowledge Goal status: PARTIALLY  MET  2.  AROM of knee extension -10 to 100 flexion to increase mobility for  transitional movements Baseline: eval left flexion 99 degrees 04/12/23: AAROM  114 05/15/2023: see ROM chart Goal status: MET  3.  Demonstrate and verbalize understanding of condition management including RICE, positioning, use of A.D., HEP Baseline: using 2 wheel walker at eval 04/12/23: using elevation Goal status: MET    LONG TERM GOALS: Target date: 06/21/2023  Pt will be independent with advanced HEP Baseline: no knowledge Goal status: IN PROGRESS  2.  Pt will be able demostrate 5 x STS in uncer 15 seconds to show improved LE strength. Baseline: Pt with heavy use of arms for 30 sec STS 3 x only 05/15/2023: 33 seconds no UE - able to perform 5 today Goal status: IN PROGRESS  3.  Pt will be able to use LRAD in order to walk and return to church Baseline: using 2 wheeled walker  04/18/23: SPC 62 feet SBA Goal status: IN PROGRESS  4.  Pt will be educated and demonstrate ability to rise from ground to standing to encourage safety in home living alone Baseline: unable to perform floor to stand x fer Goal status: INITIAL  5.  Pt will improve her L knee flexion to  >/= 120 degrees and extension to </= 5 degrees with </= 2/10 pain for a more functional and efficient gait pattern Baseline: See AROM chart Goal status: INITIAL  6.  Patient will report improved functional level on LEFS >/= 50/80 in order to allow for return to prior exercise level Baseline: eval 18/80 22.5% 05/15/2023: 30/80 - 38% function Goal status: IN PROGRESS  PLAN:  PT FREQUENCY: 2x/week  PT DURATION: 6 weeks  PLANNED INTERVENTIONS: 97164- PT Re-evaluation, 97110-Therapeutic exercises, 97530- Therapeutic activity, 97112- Neuromuscular re-education, 97535- Self Care, 96045- Manual therapy, [id number]- Gait training, [phone]- Electrical stimulation (manual), Patient/Family education, Balance training, Stair training, Taping, Dry Needling, Joint mobilization, DME instructions, Cryotherapy, and Moist heat  PLAN FOR NEXT SESSION: HEP, manual for increasing knee flex / ext.did pt bring her SPC from  home?  Ivor Mars PT  05/20/23 3:47 PM  For all possible CPT codes, reference the Planned Interventions line above.     Check all conditions that are expected to impact treatment: {Conditions expected to impact treatment:Musculoskeletal disorders   If treatment provided at initial evaluation, no treatment charged due to lack of authorization.

## 2023-05-22 ENCOUNTER — Inpatient Hospital Stay: Payer: Medicaid Other | Attending: Internal Medicine

## 2023-05-22 ENCOUNTER — Ambulatory Visit: Payer: Medicaid Other | Admitting: Internal Medicine

## 2023-05-22 ENCOUNTER — Ambulatory Visit: Admitting: Physician Assistant

## 2023-05-22 DIAGNOSIS — I82402 Acute embolism and thrombosis of unspecified deep veins of left lower extremity: Secondary | ICD-10-CM | POA: Insufficient documentation

## 2023-05-22 LAB — ANTITHROMBIN III: AntiThromb III Func: 111 % (ref 75–120)

## 2023-05-23 ENCOUNTER — Ambulatory Visit

## 2023-05-23 DIAGNOSIS — M25562 Pain in left knee: Secondary | ICD-10-CM | POA: Diagnosis not present

## 2023-05-23 DIAGNOSIS — M6281 Muscle weakness (generalized): Secondary | ICD-10-CM

## 2023-05-23 DIAGNOSIS — R262 Difficulty in walking, not elsewhere classified: Secondary | ICD-10-CM

## 2023-05-23 LAB — PROTEIN S ACTIVITY: Protein S Activity: 96 % (ref 63–140)

## 2023-05-23 LAB — CARDIOLIPIN ANTIBODIES, IGG, IGM, IGA
Anticardiolipin IgA: <9 U/mL (ref 0–11)
Anticardiolipin IgG: <9 GPL U/mL (ref 0–14)
Anticardiolipin IgM: 33 [MPL'U]/mL — ABNORMAL HIGH (ref 0–12)

## 2023-05-23 LAB — DRVVT MIX: dRVVT Mix: 48.7 s — ABNORMAL HIGH (ref 0.0–40.4)

## 2023-05-23 LAB — HOMOCYSTEINE: Homocysteine: 13.4 umol/L (ref 0.0–19.2)

## 2023-05-23 LAB — BETA-2-GLYCOPROTEIN I ABS, IGG/M/A
Beta-2 Glyco I IgG: <9 GPI IgG units (ref 0–20)
Beta-2-Glycoprotein I IgA: 15 GPI IgA units (ref 0–25)
Beta-2-Glycoprotein I IgM: <9 GPI IgM units (ref 0–32)

## 2023-05-23 LAB — PROTEIN S, TOTAL: Protein S Ag, Total: 102 % (ref 60–150)

## 2023-05-23 LAB — LUPUS ANTICOAGULANT PANEL
DRVVT: 65.4 s — ABNORMAL HIGH (ref 0.0–47.0)
PTT Lupus Anticoagulant: 38.8 s (ref 0.0–43.5)

## 2023-05-23 LAB — DRVVT CONFIRM: dRVVT Confirm: 1 ratio (ref 0.8–1.2)

## 2023-05-23 LAB — PROTEIN C ACTIVITY: Protein C Activity: 116 % (ref 73–180)

## 2023-05-23 NOTE — Therapy (Signed)
 OUTPATIENT PHYSICAL THERAPY TREATMENT   Patient Name: Tina Bryant MRN: [id number] DOB:1945-04-29, 78 y.o., female Today's Date: 05/23/2023  END OF SESSION:  PT End of Session - 05/23/23 1413     Visit Number 16    Number of Visits 24    Date for PT Re-Evaluation 06/21/23    Authorization Type [place] [employer]    Authorization Time Period Approved 5 visits 05/21/23-07/19/23    Authorization - Visit Number 1    Authorization - Number of Visits 5    PT Start Time 1445    PT Stop Time 1525    PT Time Calculation (min) 40 min    Activity Tolerance Patient limited by pain;Patient tolerated treatment well    Behavior During Therapy [facility] for tasks assessed/performed                     Past Medical History:  Diagnosis Date   Arthritis    Chronic kidney disease    CKD3   DVT (deep venous thrombosis) (HCC)    Hepatitis    b   Hypertension    Pneumonia    Pre-diabetes    Past Surgical History:  Procedure Laterality Date   EYE SURGERY Left    strabismus surgery   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   Procedure: TOTAL KNEE ARTHROPLASTY, OPEN REDUCTION INTERNAL FIXATION OF MEDIAL FEMORAL CONDYLE;  Surgeon: Neil Balls, MD;  Location: [facility];  Service: Orthopedics;  Laterality: Left;   Patient Active Problem List   Diagnosis Date Noted   Primary osteoarthritis of left knee 02/03/2023   DVT (deep venous thrombosis) (HCC) 11/01/2022   Fall at home, initial encounter 11/01/2022   CAP (community acquired pneumonia) 11/01/2022   Severe sepsis (HCC) 11/01/2022   AKI (acute kidney injury) (HCC) 11/01/2022   Acute metabolic encephalopathy 11/01/2022   CKD (chronic kidney disease) stage 3, GFR 30-59 ml/min (HCC) 11/01/2022   Elevated troponin 11/01/2022   Non-insulin  treated type 2 diabetes mellitus (HCC) 11/01/2022   High anion gap metabolic acidosis 11/01/2022   Acute deep vein thrombosis (DVT) of femoral vein of left lower extremity (HCC) 06/08/2022    Hyperlipidemia 07/09/2006   Microcytic anemia 07/09/2006   DEPRESSION 07/09/2006   Essential hypertension 07/09/2006    PCP: Janifer Meigs, FNP   REFERRING PROVIDER: Neil Balls, MD   REFERRING DIAG: Left TKA  THERAPY DIAG:  Acute pain of left knee  Difficulty in walking, not elsewhere classified  Muscle weakness (generalized)  Rationale for Evaluation and Treatment: Rehabilitation  ONSET DATE: 02-04-23 surgery L TKA had trouble for many years before surgery  SUBJECTIVE:   SUBJECTIVE STATEMENT: Pt presents to PT with reports of no current pain in L knee. Has been fairly compliant with her HEP.   EVAL: Had the surgery on February 04, 2023 of L TKA with Dr Murrell Arrant. I have been doing exercises that I received from my HHPT.  I have been trying to walk a lot and I walk every day. I do not drive. I can stand about 5 minutes right now. I can sit as long as I want. I want to go back to church.  PERTINENT HISTORY: Hx of DVT, hyperlipdemia, HTN, Hx of fall but not in last 6 months hepatitis PAIN:  Are you having pain?  Yes: NPRS scale: 0/10 in left knee and at worst 10/10 Pain location: Left knee  Pain description: sometimes sharp going down to foot Aggravating factors: standing on leg for longer  than 5 minutes, straightening the leg, swelling in left knee, I can't get in the tub without difficulty, mop floor and household chores, can't shop and carry groceries Relieving factors: not much right now Can't bend over to do grooming and cut my toe nails PRECAUTIONS: Other: post left TKA  RED FLAGS: None   WEIGHT BEARING RESTRICTIONS: Yes WBAT L  FALLS:  Has patient fallen in last 6 months? No  LIVING ENVIRONMENT: Lives with: lives alone Lives in: House/apartment Stairs: Yes: External: 2 steps; on left going up Has following equipment at home: Single point cane, Walker - 2 wheeled, shower chair, and Grab bars  OCCUPATION: retired    used to work at group home  PLOF:  Independent  PATIENT GOALS: I want to be able to walk with a cane and to go back to church.  NEXT MD VISIT: TBD  OBJECTIVE:  Note: Objective measures were completed at Evaluation unless otherwise noted.  DIAGNOSTIC FINDINGS: see imaging  PATIENT SURVEYS:  LEFS 18/80 22.5% 05/15/2023: 30/80 - 38% function  COGNITION: Overall cognitive status: Within functional limits for tasks assessed     SENSATION: WFL  EDEMA:  Circumferential: R knee 44.0cm and Left knee is 48.25 cm  MUSCLE LENGTH: Hamstrings: Right 54 deg; Left 50 deg Thomas test: + bil  POSTURE: rounded shoulders, forward head, flexed trunk , and weight shift right  PALPATION: Well healing TKA L but has a wound opending on medial side of scar  LOWER EXTREMITY ROM:  Active ROM Right eval Left eval Left 03-21-23 Left 03/28/23 Left 04/02/23 Left 04/12/23 Left 04/25/23 Left 05/15/23  Hip flexion          Hip extension          Hip abduction          Hip adduction          Hip internal rotation          Hip external rotation          Knee flexion 133/P 138 99 P 109 A105 108 AA 111 AA 114 AA 114 AROM 110 AROM  Knee extension -5 -11 ext -10 ext     0 AROM  Ankle dorsiflexion          Ankle plantarflexion          Ankle inversion          Ankle eversion           (Blank rows = not tested)  LOWER EXTREMITY MMT:  MMT Right eval Left eval  Hip flexion 4 4-  Hip extension 4 4-  Hip abduction 3+ 3-  Hip adduction    Hip internal rotation    Hip external rotation    Knee flexion 4+ 3-  Knee extension 4 3-  Ankle dorsiflexion    Ankle plantarflexion    Ankle inversion    Ankle eversion     (Blank rows = not tested)  LOWER EXTREMITY SPECIAL TESTS:  NT due to surgery  FUNCTIONAL TESTS:  5 times sit to stand: unable to stand without using hands 30 sec with UE support 3 x 05/15/2023: 33 seconds no UE - able to perform 5 today 2 minute walk test: 158 ft (462 ft Norm) using rolling walker: 03/28/23: 179 feet   04/18/23: 216 feet  GAIT: Distance walked: 124ft 2 MWT Assistive device utilized: Walker - 2 wheeled Level of assistance: Modified independence Comments: Pt with antalgic gait with 2 wheeled walker   TREATMENT:  [facility] Adult PT Treatment:                                                DATE: 05/23/23 Nu-step L5 x 4 min UE/LE for functional activity tolerance LAQ 3x10 4# L STS 3x5 - low table with UE Ambulated 123ft  with SPC in R hand CGA  Supine QS x 10 - 5" hold Supine SLR 3x10 L SAQ 3x15 L 4# TKE with ball x 10 L  [facility] Adult PT Treatment:                                                DATE: 05/20/23 Nu-step L5 x 4 min UE/LE for functional activity tolerance LAQ 3x10 4# L STS 3x5 - low table with UE Ambulated 136ft x 2 with SPC in R hand CGA  Supine QS x 10 - 5" hold Supine SLR 3x10 L SAQ 3x10 L 4# Hooklying clamshell 2x20 blue band  [facility] Adult PT Treatment:                                                DATE: 05/15/23 Nu-step L5 x 4 min UE/LE for functional activity tolerance LAQ 3x10 3# L Supine SLR 2x10 L Hooklying clamshell 2x20 blue band Hooklying march 2x20 blue band Step ups x 10 L stance 6in step - 1 UE hold Sit to stand x 10 from hi-lo table lowered between sets to promote strengthening Assessment of tests/measures, goals, and outcomes for re-certification  [facility] Adult PT Treatment:                                                DATE: 05/09/23 Nu-step L5 x 5 min UE/LE for functional activity tolerance LAQ 3x10 3# L Sit to stand 3x5 from hi-lo table lowered between sets to promote strengthening Gait with SPC 2x15ft with SPC CGA  Supine SLR 2x10 L SAQ 3x10 3# Hooklying clamshell 3x15 black band  [facility] Adult PT Treatment:                                                DATE: 05/02/23 Nu-step L5 x 5 min UE/LE for functional activity tolerance LAQ 3x10 3# L Sit to stand 3 x 5 from hi- lo table lowered between sets to promote strengthening Supine SLR 2x10 L SAQ 3x10  3# Hooklying clamshell 3x15 blue band Hooklying march 2x20 blue band Gait with SPC 153ft with SPC CGA   [facility] Adult PT Treatment:                                                DATE: 04/30/23 Therapeutic Exercise: nu-step L5 50m while taking subjective  and planning session with patient LAQ - 2x10 5# L, 10x 7.5# Seated L LE march 5# 2x10 SLR 10 x 2  Supine clam Blue TB 2x10 Hooklying hip Flexion Blue TB 10 x 2  Therapeutic Activity: Amb with SPC 180 ft with CGA  6 inch step up with 1 UE 2x10 STS without UE x 10-elevated mat   [facility] Adult PT Treatment:                                                DATE: 04/25/23 Therapeutic Exercise: LAQ 3x10 3# L Seated L LE march 4# 3x10 SLR 10 x 2  SAQ 4# 10 X 3  Supine clam GTB x 10 Hooklying hip Flexion  GTB 10 x 2  Heel slide AAROM  Therapeutic Activity: Nustep L5 UE/LE x 5 minutes  Amb with SPC 2x178ft with CGA  6 inch step up with 1 UE x 15  STS without UE x 10-elevated mat    PATIENT EDUCATION:  Education details: continue HEP Person educated: Patient Education method: Explanation, Demonstration, Tactile cues, Verbal cues, and Handouts Education comprehension: verbalized understanding, returned demonstration, verbal cues required, tactile cues required, and needs further education  HOME EXERCISE PROGRAM: Access Code: [email or web address]  updated 03-21-23 URL: [email or web address] Date: 03/21/2023 Prepared by: Sharlet Dawson  Exercises - Sit to stand with sink support Movement snack  - 1 x daily - 7 x weekly - 3 sets - 10 reps - Supine Knee Extension Stretch on Towel Roll  - 1 x daily - 7 x weekly - 3 sets - 10 reps - 5 sec  hold - Supine Heel Slide with Strap  - 1 x daily - 7 x weekly - 3 sets - 10 reps - Supine Short Arc Quad  - 1 x daily - 7 x weekly - 3 sets - 10 reps - Seated Hamstring Stretch  - 1-2 x daily - 7 x weekly - 1 sets - 3 reps - 30 sec hold - Long Arc Quad  - 1 x daily - 7 x weekly - 3 sets - 10 reps -  Seated Knee Flexion AAROM  - 1 x daily - 7 x weekly - 3 sets - 10 reps  ASSESSMENT:  CLINICAL IMPRESSION: Pt was able to complete all prescribed exercises with no adverse effect. Exercises focused on improving quad and hip flexor strength as well as knee ROM in order to improve comfort and function. Improved distance and gait with SPC today. Continues to benefit from skilled PT services, will continue to progress quad strength as able.   EVAL- Patient is a 77 y.o. female who was seen today for physical therapy evaluation and treatment for L TKA. Pt has hx of DVT on Eliquis  , sedentary life style and decreased mobility.  Pt lives alone and would like to improve mobility in order to safely live on her own.  Pt would like to be able to safely enter and exit tub and perform household chores safely in home with adequate sleep.  Pt tend to be more sedentary and understands she needs to improve mobility in order to prenvent DVTS.  Pt will benefit from skilled PT to address impairments and maximize functional mobility  OBJECTIVE IMPAIRMENTS: Abnormal gait, decreased activity tolerance, decreased knowledge of use of DME, decreased mobility, difficulty walking, decreased ROM, decreased strength, increased edema, postural dysfunction,  obesity, and pain.   ACTIVITY LIMITATIONS: carrying, lifting, bending, standing, squatting, sleeping, stairs, transfers, hygiene/grooming, and locomotion level  PARTICIPATION LIMITATIONS: meal prep, cleaning, laundry, shopping, and church  PERSONAL FACTORS: Hx of DVT, hyperlipdemia, HTN, Hx of fall but not in last 6 months are also affecting patient's functional outcome. hepatitis  REHAB POTENTIAL: Good  CLINICAL DECISION MAKING: Evolving/moderate complexity  EVALUATION COMPLEXITY: Moderate   GOALS: Goals reviewed with patient? Yes  SHORT TERM GOALS: Target date: 04-09-23  Pt will be independent with initial HEP Baseline:no knowledge Goal status: PARTIALLY  MET  2.   AROM of knee extension -10 to 100 flexion to increase mobility for transitional movements Baseline: eval left flexion 99 degrees 04/12/23: AAROM 114 05/15/2023: see ROM chart Goal status: MET  3.  Demonstrate and verbalize understanding of condition management including RICE, positioning, use of A.D., HEP Baseline: using 2 wheel walker at eval 04/12/23: using elevation Goal status: MET    LONG TERM GOALS: Target date: 06/21/2023  Pt will be independent with advanced HEP Baseline: no knowledge Goal status: IN PROGRESS  2.  Pt will be able demostrate 5 x STS in uncer 15 seconds to show improved LE strength. Baseline: Pt with heavy use of arms for 30 sec STS 3 x only 05/15/2023: 33 seconds no UE - able to perform 5 today Goal status: IN PROGRESS  3.  Pt will be able to use LRAD in order to walk and return to church Baseline: using 2 wheeled walker  04/18/23: SPC 62 feet SBA Goal status: IN PROGRESS  4.  Pt will be educated and demonstrate ability to rise from ground to standing to encourage safety in home living alone Baseline: unable to perform floor to stand x fer Goal status: INITIAL  5.  Pt will improve her L knee flexion to  >/= 120 degrees and extension to </= 5 degrees with </= 2/10 pain for a more functional and efficient gait pattern Baseline: See AROM chart Goal status: INITIAL  6.  Patient will report improved functional level on LEFS >/= 50/80 in order to allow for return to prior exercise level Baseline: eval 18/80 22.5% 05/15/2023: 30/80 - 38% function Goal status: IN PROGRESS  PLAN:  PT FREQUENCY: 2x/week  PT DURATION: 6 weeks  PLANNED INTERVENTIONS: 97164- PT Re-evaluation, 97110-Therapeutic exercises, 97530- Therapeutic activity, 97112- Neuromuscular re-education, 97535- Self Care, 16109- Manual therapy, [id number]- Gait training, [phone]- Electrical stimulation (manual), Patient/Family education, Balance training, Stair training, Taping, Dry Needling, Joint mobilization,  DME instructions, Cryotherapy, and Moist heat  PLAN FOR NEXT SESSION: HEP, manual for increasing knee flex / ext.did pt bring her SPC from home?  Ivor Mars PT  05/23/23 3:44 PM

## 2023-05-24 LAB — PROTEIN C, TOTAL: Protein C, Total: 133 % (ref 60–150)

## 2023-05-27 ENCOUNTER — Encounter (HOSPITAL_BASED_OUTPATIENT_CLINIC_OR_DEPARTMENT_OTHER): Admitting: Internal Medicine

## 2023-05-27 DIAGNOSIS — L97828 Non-pressure chronic ulcer of other part of left lower leg with other specified severity: Secondary | ICD-10-CM | POA: Diagnosis not present

## 2023-05-27 DIAGNOSIS — S81002D Unspecified open wound, left knee, subsequent encounter: Secondary | ICD-10-CM | POA: Diagnosis not present

## 2023-05-27 LAB — FACTOR 5 LEIDEN

## 2023-05-27 LAB — PROTHROMBIN GENE MUTATION

## 2023-05-28 ENCOUNTER — Ambulatory Visit

## 2023-05-28 DIAGNOSIS — M25562 Pain in left knee: Secondary | ICD-10-CM | POA: Diagnosis not present

## 2023-05-28 DIAGNOSIS — M6281 Muscle weakness (generalized): Secondary | ICD-10-CM

## 2023-05-28 DIAGNOSIS — R262 Difficulty in walking, not elsewhere classified: Secondary | ICD-10-CM

## 2023-05-28 NOTE — Therapy (Signed)
 OUTPATIENT PHYSICAL THERAPY TREATMENT   Patient Name: Tina Bryant MRN: [id number] DOB:[birth year], 78 y.o., female Today's Date: 05/28/2023  END OF SESSION:  PT End of Session - 05/28/23 1343     Visit Number 17    Number of Visits 24    Date for PT Re-Evaluation 06/21/23    Authorization Type [employer]    Authorization Time Period Approved 5 visits 05/21/23-07/19/23    Authorization - Visit Number 2    Authorization - Number of Visits 5    PT Start Time 1400    PT Stop Time 1440    PT Time Calculation (min) 40 min    Activity Tolerance Patient limited by pain;Patient tolerated treatment well    Behavior During Therapy [facility] for tasks assessed/performed                      Past Medical History:  Diagnosis Date   Arthritis    Chronic kidney disease    CKD3   DVT (deep venous thrombosis) (HCC)    Hepatitis    b   Hypertension    Pneumonia    Pre-diabetes    Past Surgical History:  Procedure Laterality Date   EYE SURGERY Left    strabismus surgery   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   Procedure: TOTAL KNEE ARTHROPLASTY, OPEN REDUCTION INTERNAL FIXATION OF MEDIAL FEMORAL CONDYLE;  Surgeon: Neil Balls, MD;  Location: [facility];  Service: Orthopedics;  Laterality: Left;   Patient Active Problem List   Diagnosis Date Noted   Primary osteoarthritis of left knee 02/03/2023   DVT (deep venous thrombosis) (HCC) 11/01/2022   Fall at home, initial encounter 11/01/2022   CAP (community acquired pneumonia) 11/01/2022   Severe sepsis (HCC) 11/01/2022   AKI (acute kidney injury) (HCC) 11/01/2022   Acute metabolic encephalopathy 11/01/2022   CKD (chronic kidney disease) stage 3, GFR 30-59 ml/min (HCC) 11/01/2022   Elevated troponin 11/01/2022   Non-insulin  treated type 2 diabetes mellitus (HCC) 11/01/2022   High anion gap metabolic acidosis 11/01/2022   Acute deep vein thrombosis (DVT) of femoral vein of left lower extremity (HCC) 06/08/2022    Hyperlipidemia 07/09/2006   Microcytic anemia 07/09/2006   DEPRESSION 07/09/2006   Essential hypertension 07/09/2006    PCP: Janifer Meigs, FNP   REFERRING PROVIDER: Neil Balls, MD   REFERRING DIAG: Left TKA  THERAPY DIAG:  Acute pain of left knee  Difficulty in walking, not elsewhere classified  Muscle weakness (generalized)  Rationale for Evaluation and Treatment: Rehabilitation  ONSET DATE: 02-04-23 surgery L TKA had trouble for many years before surgery  SUBJECTIVE:   SUBJECTIVE STATEMENT: Pt presents to PT with no reports of change. Has been compliant with HEP.  EVAL: Had the surgery on February 04, 2023 of L TKA with Dr Murrell Arrant. I have been doing exercises that I received from my HHPT.  I have been trying to walk a lot and I walk every day. I do not drive. I can stand about 5 minutes right now. I can sit as long as I want. I want to go back to church.  PERTINENT HISTORY: Hx of DVT, hyperlipdemia, HTN, Hx of fall but not in last 6 months hepatitis PAIN:  Are you having pain?  Yes: NPRS scale: 0/10 in left knee and at worst 10/10 Pain location: Left knee  Pain description: sometimes sharp going down to foot Aggravating factors: standing on leg for longer than 5 minutes, straightening the leg,  swelling in left knee, I can't get in the tub without difficulty, mop floor and household chores, can't shop and carry groceries Relieving factors: not much right now Can't bend over to do grooming and cut my toe nails PRECAUTIONS: Other: post left TKA  RED FLAGS: None   WEIGHT BEARING RESTRICTIONS: Yes WBAT L  FALLS:  Has patient fallen in last 6 months? No  LIVING ENVIRONMENT: Lives with: lives alone Lives in: House/apartment Stairs: Yes: External: 2 steps; on left going up Has following equipment at home: Single point cane, Walker - 2 wheeled, shower chair, and Grab bars  OCCUPATION: retired    used to work at group home  PLOF: Independent  PATIENT GOALS:  I want to be able to walk with a cane and to go back to church.  NEXT MD VISIT: TBD  OBJECTIVE:  Note: Objective measures were completed at Evaluation unless otherwise noted.  DIAGNOSTIC FINDINGS: see imaging  PATIENT SURVEYS:  LEFS 18/80 22.5% 05/15/2023: 30/80 - 38% function  COGNITION: Overall cognitive status: Within functional limits for tasks assessed     SENSATION: WFL  EDEMA:  Circumferential: R knee 44.0cm and Left knee is 48.25 cm  MUSCLE LENGTH: Hamstrings: Right 54 deg; Left 50 deg Thomas test: + bil  POSTURE: rounded shoulders, forward head, flexed trunk , and weight shift right  PALPATION: Well healing TKA L but has a wound opending on medial side of scar  LOWER EXTREMITY ROM:  Active ROM Right eval Left eval Left 03-21-23 Left 03/28/23 Left 04/02/23 Left 04/12/23 Left 04/25/23 Left 05/15/23  Hip flexion          Hip extension          Hip abduction          Hip adduction          Hip internal rotation          Hip external rotation          Knee flexion 133/P 138 99 P 109 A105 108 AA 111 AA 114 AA 114 AROM 110 AROM  Knee extension -5 -11 ext -10 ext     0 AROM  Ankle dorsiflexion          Ankle plantarflexion          Ankle inversion          Ankle eversion           (Blank rows = not tested)  LOWER EXTREMITY MMT:  MMT Right eval Left eval  Hip flexion 4 4-  Hip extension 4 4-  Hip abduction 3+ 3-  Hip adduction    Hip internal rotation    Hip external rotation    Knee flexion 4+ 3-  Knee extension 4 3-  Ankle dorsiflexion    Ankle plantarflexion    Ankle inversion    Ankle eversion     (Blank rows = not tested)  LOWER EXTREMITY SPECIAL TESTS:  NT due to surgery  FUNCTIONAL TESTS:  5 times sit to stand: unable to stand without using hands 30 sec with UE support 3 x 05/15/2023: 33 seconds no UE - able to perform 5 today 2 minute walk test: 158 ft (462 ft Norm) using rolling walker: 03/28/23: 179 feet  04/18/23: 216  feet  GAIT: Distance walked: 158ft 2 MWT Assistive device utilized: Walker - 2 wheeled Level of assistance: Modified independence Comments: Pt with antalgic gait with 2 wheeled walker   TREATMENT: [facility] Adult PT Treatment:  DATE: 05/28/23 Nu-step L5 x 4 min UE/LE for functional activity tolerance Supine SLR 3x5 2# L Bridge 2x10 Ambulated 123ft  with SPC in R hand CGA  Hooklying clamshell 2x20 blue band Hooklying march 2x20 blue band Seated march 2x20 4# STS 3x5 - with UE lower table Supine QS x 10 - 5" hold SAQ 3x15 L 4# TKE with ball x 10 L  [facility] Adult PT Treatment:                                                DATE: 05/23/23 Nu-step L5 x 4 min UE/LE for functional activity tolerance LAQ 3x10 4# L STS 3x5 - low table with UE Ambulated 18ft  with SPC in R hand CGA  Supine QS x 10 - 5" hold Supine SLR 3x10 L SAQ 3x15 L 4# TKE with ball x 10 L  [facility] Adult PT Treatment:                                                DATE: 05/20/23 Nu-step L5 x 4 min UE/LE for functional activity tolerance LAQ 3x10 4# L STS 3x5 - low table with UE Ambulated 185ft x 2 with SPC in R hand CGA  Supine QS x 10 - 5" hold Supine SLR 3x10 L SAQ 3x10 L 4# Hooklying clamshell 2x20 blue band  [facility] Adult PT Treatment:                                                DATE: 05/15/23 Nu-step L5 x 4 min UE/LE for functional activity tolerance LAQ 3x10 3# L Supine SLR 2x10 L Hooklying clamshell 2x20 blue band Hooklying march 2x20 blue band Step ups x 10 L stance 6in step - 1 UE hold Sit to stand x 10 from hi-lo table lowered between sets to promote strengthening Assessment of tests/measures, goals, and outcomes for re-certification  [facility] Adult PT Treatment:                                                DATE: 05/09/23 Nu-step L5 x 5 min UE/LE for functional activity tolerance LAQ 3x10 3# L Sit to stand 3x5 from hi-lo table lowered between sets to promote  strengthening Gait with SPC 2x144ft with SPC CGA  Supine SLR 2x10 L SAQ 3x10 3# Hooklying clamshell 3x15 black band  PATIENT EDUCATION:  Education details: continue HEP Person educated: Patient Education method: Explanation, Demonstration, Tactile cues, Verbal cues, and Handouts Education comprehension: verbalized understanding, returned demonstration, verbal cues required, tactile cues required, and needs further education  HOME EXERCISE PROGRAM: Access Code: [email or web address]  updated 03-21-23 URL: [email or web address] Date: 03/21/2023 Prepared by: Sharlet Dawson  Exercises - Sit to stand with sink support Movement snack  - 1 x daily - 7 x weekly - 3 sets - 10 reps - Supine Knee Extension Stretch on Towel Roll  - 1 x daily - 7 x weekly - 3 sets - 10  reps - 5 sec  hold - Supine Heel Slide with Strap  - 1 x daily - 7 x weekly - 3 sets - 10 reps - Supine Short Arc Quad  - 1 x daily - 7 x weekly - 3 sets - 10 reps - Seated Hamstring Stretch  - 1-2 x daily - 7 x weekly - 1 sets - 3 reps - 30 sec hold - Long Arc Quad  - 1 x daily - 7 x weekly - 3 sets - 10 reps - Seated Knee Flexion AAROM  - 1 x daily - 7 x weekly - 3 sets - 10 reps  ASSESSMENT:  CLINICAL IMPRESSION: Pt was able to complete all prescribed exercises with no adverse effect. Exercises focused on improving quad and hip flexor strength as well as knee ROM in order to improve comfort and function. Continued to work on progressing distance with gait using SPC today. No changes to HEP. Continues to benefit from skilled PT services, will continue to progress quad strength and gait as able.   EVAL- Patient is a 78 y.o. female who was seen today for physical therapy evaluation and treatment for L TKA. Pt has hx of DVT on Eliquis  , sedentary life style and decreased mobility.  Pt lives alone and would like to improve mobility in order to safely live on her own.  Pt would like to be able to safely enter and exit tub and perform  household chores safely in home with adequate sleep.  Pt tend to be more sedentary and understands she needs to improve mobility in order to prenvent DVTS.  Pt will benefit from skilled PT to address impairments and maximize functional mobility  OBJECTIVE IMPAIRMENTS: Abnormal gait, decreased activity tolerance, decreased knowledge of use of DME, decreased mobility, difficulty walking, decreased ROM, decreased strength, increased edema, postural dysfunction, obesity, and pain.   ACTIVITY LIMITATIONS: carrying, lifting, bending, standing, squatting, sleeping, stairs, transfers, hygiene/grooming, and locomotion level  PARTICIPATION LIMITATIONS: meal prep, cleaning, laundry, shopping, and church  PERSONAL FACTORS: Hx of DVT, hyperlipdemia, HTN, Hx of fall but not in last 6 months are also affecting patient's functional outcome. hepatitis  REHAB POTENTIAL: Good  CLINICAL DECISION MAKING: Evolving/moderate complexity  EVALUATION COMPLEXITY: Moderate   GOALS: Goals reviewed with patient? Yes  SHORT TERM GOALS: Target date: 04-09-23  Pt will be independent with initial HEP Baseline:no knowledge Goal status: PARTIALLY  MET  2.  AROM of knee extension -10 to 100 flexion to increase mobility for transitional movements Baseline: eval left flexion 99 degrees 04/12/23: AAROM 114 05/15/2023: see ROM chart Goal status: MET  3.  Demonstrate and verbalize understanding of condition management including RICE, positioning, use of A.D., HEP Baseline: using 2 wheel walker at eval 04/12/23: using elevation Goal status: MET    LONG TERM GOALS: Target date: 06/21/2023  Pt will be independent with advanced HEP Baseline: no knowledge Goal status: IN PROGRESS  2.  Pt will be able demostrate 5 x STS in uncer 15 seconds to show improved LE strength. Baseline: Pt with heavy use of arms for 30 sec STS 3 x only 05/15/2023: 33 seconds no UE - able to perform 5 today Goal status: IN PROGRESS  3.  Pt will be  able to use LRAD in order to walk and return to church Baseline: using 2 wheeled walker  04/18/23: SPC 62 feet SBA Goal status: IN PROGRESS  4.  Pt will be educated and demonstrate ability to rise from ground to standing to  encourage safety in home living alone Baseline: unable to perform floor to stand x fer Goal status: INITIAL  5.  Pt will improve her L knee flexion to  >/= 120 degrees and extension to </= 5 degrees with </= 2/10 pain for a more functional and efficient gait pattern Baseline: See AROM chart Goal status: INITIAL  6.  Patient will report improved functional level on LEFS >/= 50/80 in order to allow for return to prior exercise level Baseline: eval 18/80 22.5% 05/15/2023: 30/80 - 38% function Goal status: IN PROGRESS  PLAN:  PT FREQUENCY: 2x/week  PT DURATION: 6 weeks  PLANNED INTERVENTIONS: [id number]- PT Re-evaluation, 97110-Therapeutic exercises, 97530- Therapeutic activity, 97112- Neuromuscular re-education, 97535- Self Care, 96045- Manual therapy, [id number]- Gait training, [phone]- Electrical stimulation (manual), Patient/Family education, Balance training, Stair training, Taping, Dry Needling, Joint mobilization, DME instructions, Cryotherapy, and Moist heat  PLAN FOR NEXT SESSION: HEP, manual for increasing knee flex / ext.did pt bring her SPC from home?  Ivor Mars PT  05/28/23 2:42 PM

## 2023-05-30 ENCOUNTER — Ambulatory Visit: Admitting: Physical Therapy

## 2023-05-30 DIAGNOSIS — R262 Difficulty in walking, not elsewhere classified: Secondary | ICD-10-CM

## 2023-05-30 DIAGNOSIS — M25562 Pain in left knee: Secondary | ICD-10-CM

## 2023-05-30 DIAGNOSIS — M6281 Muscle weakness (generalized): Secondary | ICD-10-CM

## 2023-05-30 NOTE — Therapy (Signed)
 OUTPATIENT PHYSICAL THERAPY TREATMENT   Patient Name: Kourtlyn Charlet MRN: [id number] DOB:[birth year], 78 y.o., female Today's Date: 05/30/2023  END OF SESSION:  PT End of Session - 05/30/23 1413     Visit Number 18    Number of Visits 24    Date for PT Re-Evaluation 06/21/23    Authorization Type [place] [employer]    Authorization Time Period Approved 5 visits 05/21/23-07/19/23    Authorization - Visit Number 3    Authorization - Number of Visits 5    PT Start Time 1413    PT Stop Time 1457    PT Time Calculation (min) 44 min    Equipment Utilized During Treatment Gait belt    Activity Tolerance Patient limited by pain;Patient tolerated treatment well    Behavior During Therapy [facility] for tasks assessed/performed                       Past Medical History:  Diagnosis Date   Arthritis    Chronic kidney disease    CKD3   DVT (deep venous thrombosis) (HCC)    Hepatitis    b   Hypertension    Pneumonia    Pre-diabetes    Past Surgical History:  Procedure Laterality Date   EYE SURGERY Left    strabismus surgery   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   Procedure: TOTAL KNEE ARTHROPLASTY, OPEN REDUCTION INTERNAL FIXATION OF MEDIAL FEMORAL CONDYLE;  Surgeon: Neil Balls, MD;  Location: [facility];  Service: Orthopedics;  Laterality: Left;   Patient Active Problem List   Diagnosis Date Noted   Primary osteoarthritis of left knee 02/03/2023   DVT (deep venous thrombosis) (HCC) 11/01/2022   Fall at home, initial encounter 11/01/2022   CAP (community acquired pneumonia) 11/01/2022   Severe sepsis (HCC) 11/01/2022   AKI (acute kidney injury) (HCC) 11/01/2022   Acute metabolic encephalopathy 11/01/2022   CKD (chronic kidney disease) stage 3, GFR 30-59 ml/min (HCC) 11/01/2022   Elevated troponin 11/01/2022   Non-insulin  treated type 2 diabetes mellitus (HCC) 11/01/2022   High anion gap metabolic acidosis 11/01/2022   Acute deep vein thrombosis (DVT) of femoral  vein of left lower extremity (HCC) 06/08/2022   Hyperlipidemia 07/09/2006   Microcytic anemia 07/09/2006   DEPRESSION 07/09/2006   Essential hypertension 07/09/2006    PCP: Janifer Meigs, FNP   REFERRING PROVIDER: Neil Balls, MD   REFERRING DIAG: Left TKA  THERAPY DIAG:  Acute pain of left knee  Difficulty in walking, not elsewhere classified  Muscle weakness (generalized)  Rationale for Evaluation and Treatment: Rehabilitation  ONSET DATE: 02-04-23 surgery L TKA had trouble for many years before surgery  SUBJECTIVE:   SUBJECTIVE STATEMENT: 05/30/2023 " I've been doing okay. No pain in the left knee, just pain in the Right."  EVAL: Had the surgery on February 04, 2023 of L TKA with Dr Murrell Arrant. I have been doing exercises that I received from my HHPT.  I have been trying to walk a lot and I walk every day. I do not drive. I can stand about 5 minutes right now. I can sit as long as I want. I want to go back to church.  PERTINENT HISTORY: Hx of DVT, hyperlipdemia, HTN, Hx of fall but not in last 6 months hepatitis PAIN:  Are you having pain?  Yes: NPRS scale: 0/10 in left knee and at worst 10/10 Pain location: Left knee  Pain description: sometimes sharp going down to foot  Aggravating factors: standing on leg for longer than 5 minutes, straightening the leg, swelling in left knee, I can't get in the tub without difficulty, mop floor and household chores, can't shop and carry groceries Relieving factors: not much right now Can't bend over to do grooming and cut my toe nails PRECAUTIONS: Other: post left TKA  RED FLAGS: None   WEIGHT BEARING RESTRICTIONS: Yes WBAT L  FALLS:  Has patient fallen in last 6 months? No  LIVING ENVIRONMENT: Lives with: lives alone Lives in: House/apartment Stairs: Yes: External: 2 steps; on left going up Has following equipment at home: Single point cane, Walker - 2 wheeled, shower chair, and Grab bars  OCCUPATION: retired    used  to work at group home  PLOF: Independent  PATIENT GOALS: I want to be able to walk with a cane and to go back to church.  NEXT MD VISIT: TBD  OBJECTIVE:  Note: Objective measures were completed at Evaluation unless otherwise noted.  DIAGNOSTIC FINDINGS: see imaging  PATIENT SURVEYS:  LEFS 18/80 22.5% 05/15/2023: 30/80 - 38% function  COGNITION: Overall cognitive status: Within functional limits for tasks assessed     SENSATION: WFL  EDEMA:  Circumferential: R knee 44.0cm and Left knee is 48.25 cm  MUSCLE LENGTH: Hamstrings: Right 54 deg; Left 50 deg Thomas test: + bil  POSTURE: rounded shoulders, forward head, flexed trunk , and weight shift right  PALPATION: Well healing TKA L but has a wound opending on medial side of scar  LOWER EXTREMITY ROM:  Active ROM Right eval Left eval Left 03-21-23 Left 03/28/23 Left 04/02/23 Left 04/12/23 Left 04/25/23 Left 05/15/23  Hip flexion          Hip extension          Hip abduction          Hip adduction          Hip internal rotation          Hip external rotation          Knee flexion 133/P 138 99 P 109 A105 108 AA 111 AA 114 AA 114 AROM 110 AROM  Knee extension -5 -11 ext -10 ext     0 AROM  Ankle dorsiflexion          Ankle plantarflexion          Ankle inversion          Ankle eversion           (Blank rows = not tested)  LOWER EXTREMITY MMT:  MMT Right eval Left eval  Hip flexion 4 4-  Hip extension 4 4-  Hip abduction 3+ 3-  Hip adduction    Hip internal rotation    Hip external rotation    Knee flexion 4+ 3-  Knee extension 4 3-  Ankle dorsiflexion    Ankle plantarflexion    Ankle inversion    Ankle eversion     (Blank rows = not tested)  LOWER EXTREMITY SPECIAL TESTS:  NT due to surgery  FUNCTIONAL TESTS:  5 times sit to stand: unable to stand without using hands 30 sec with UE support 3 x 05/15/2023: 33 seconds no UE - able to perform 5 today 2 minute walk test: 158 ft (462 ft Norm) using rolling  walker: 03/28/23: 179 feet  04/18/23: 216 feet  GAIT: Distance walked: 169ft 2 MWT Assistive device utilized: Walker - 2 wheeled Level of assistance: Modified independence Comments: Pt with antalgic gait  with 2 wheeled walker   TREATMENT: [facility] Adult PT Treatment:                                                DATE: 05/30/23 Nu-Step L5 minx 5 LE  Walking x 133ft with SPC Sit to stand from table 3 x 5 increasing height of surface due to fatigue LAQ 2 x 10 with 4# Seated marching 2 x 20 alternating L/R with 4# ( cues to avoid posterior trunk lean) SLR LLE 2 x 10  Sustained hip abduction with bridge 2 x 10 with RTB Supine clamshell with Blue theraband 2 x 15  [facility] Adult PT Treatment:                                                DATE: 05/28/23 Nu-step L5 x 4 min UE/LE for functional activity tolerance Supine SLR 3x5 2# L Bridge 2x10 Ambulated 164ft  with SPC in R hand CGA  Hooklying clamshell 2x20 blue band Hooklying march 2x20 blue band Seated march 2x20 4# STS 3x5 - with UE lower table Supine QS x 10 - 5" hold SAQ 3x15 L 4# TKE with ball x 10 L  [facility] Adult PT Treatment:                                                DATE: 05/23/23 Nu-step L5 x 4 min UE/LE for functional activity tolerance LAQ 3x10 4# L STS 3x5 - low table with UE Ambulated 161ft  with SPC in R hand CGA  Supine QS x 10 - 5" hold Supine SLR 3x10 L SAQ 3x15 L 4# TKE with ball x 10 L  PATIENT EDUCATION:  Education details: continue HEP Person educated: Patient Education method: Explanation, Demonstration, Tactile cues, Verbal cues, and Handouts Education comprehension: verbalized understanding, returned demonstration, verbal cues required, tactile cues required, and needs further education  HOME EXERCISE PROGRAM: Access Code: [email or web address]  updated 03-21-23 URL: [email or web address] Date: 03/21/2023 Prepared by: Sharlet Dawson  Exercises - Sit to stand with sink support Movement snack  - 1 x  daily - 7 x weekly - 3 sets - 10 reps - Supine Knee Extension Stretch on Towel Roll  - 1 x daily - 7 x weekly - 3 sets - 10 reps - 5 sec  hold - Supine Heel Slide with Strap  - 1 x daily - 7 x weekly - 3 sets - 10 reps - Supine Short Arc Quad  - 1 x daily - 7 x weekly - 3 sets - 10 reps - Seated Hamstring Stretch  - 1-2 x daily - 7 x weekly - 1 sets - 3 reps - 30 sec hold - Long Arc Quad  - 1 x daily - 7 x weekly - 3 sets - 10 reps - Seated Knee Flexion AAROM  - 1 x daily - 7 x weekly - 3 sets - 10 reps  ASSESSMENT:  CLINICAL IMPRESSION: 5/22/2025Mrs Shands arrives to PT today noting no pain in the l knee but reports her biggest complaint is the R knee. Continued working on  gross LE strengthening and gait training with SPC which she does continue to fatigue quickly. No report of pain end of session.    EVAL- Patient is a 78 y.o. female who was seen today for physical therapy evaluation and treatment for L TKA. Pt has hx of DVT on Eliquis  , sedentary life style and decreased mobility.  Pt lives alone and would like to improve mobility in order to safely live on her own.  Pt would like to be able to safely enter and exit tub and perform household chores safely in home with adequate sleep.  Pt tend to be more sedentary and understands she needs to improve mobility in order to prenvent DVTS.  Pt will benefit from skilled PT to address impairments and maximize functional mobility  OBJECTIVE IMPAIRMENTS: Abnormal gait, decreased activity tolerance, decreased knowledge of use of DME, decreased mobility, difficulty walking, decreased ROM, decreased strength, increased edema, postural dysfunction, obesity, and pain.   ACTIVITY LIMITATIONS: carrying, lifting, bending, standing, squatting, sleeping, stairs, transfers, hygiene/grooming, and locomotion level  PARTICIPATION LIMITATIONS: meal prep, cleaning, laundry, shopping, and church  PERSONAL FACTORS: Hx of DVT, hyperlipdemia, HTN, Hx of fall but not in  last 6 months are also affecting patient's functional outcome. hepatitis  REHAB POTENTIAL: Good  CLINICAL DECISION MAKING: Evolving/moderate complexity  EVALUATION COMPLEXITY: Moderate   GOALS: Goals reviewed with patient? Yes  SHORT TERM GOALS: Target date: 04-09-23  Pt will be independent with initial HEP Baseline:no knowledge Goal status: PARTIALLY  MET  2.  AROM of knee extension -10 to 100 flexion to increase mobility for transitional movements Baseline: eval left flexion 99 degrees 04/12/23: AAROM 114 05/15/2023: see ROM chart Goal status: MET  3.  Demonstrate and verbalize understanding of condition management including RICE, positioning, use of A.D., HEP Baseline: using 2 wheel walker at eval 04/12/23: using elevation Goal status: MET    LONG TERM GOALS: Target date: 06/21/2023  Pt will be independent with advanced HEP Baseline: no knowledge Goal status: IN PROGRESS  2.  Pt will be able demostrate 5 x STS in uncer 15 seconds to show improved LE strength. Baseline: Pt with heavy use of arms for 30 sec STS 3 x only 05/15/2023: 33 seconds no UE - able to perform 5 today Goal status: IN PROGRESS  3.  Pt will be able to use LRAD in order to walk and return to church Baseline: using 2 wheeled walker  04/18/23: SPC 62 feet SBA Goal status: IN PROGRESS  4.  Pt will be educated and demonstrate ability to rise from ground to standing to encourage safety in home living alone Baseline: unable to perform floor to stand x fer Goal status: INITIAL  5.  Pt will improve her L knee flexion to  >/= 120 degrees and extension to </= 5 degrees with </= 2/10 pain for a more functional and efficient gait pattern Baseline: See AROM chart Goal status: INITIAL  6.  Patient will report improved functional level on LEFS >/= 50/80 in order to allow for return to prior exercise level Baseline: eval 18/80 22.5% 05/15/2023: 30/80 - 38% function Goal status: IN PROGRESS  PLAN:  PT FREQUENCY:  2x/week  PT DURATION: 6 weeks  PLANNED INTERVENTIONS: 97164- PT Re-evaluation, 97110-Therapeutic exercises, 97530- Therapeutic activity, 97112- Neuromuscular re-education, 97535- Self Care, 52841- Manual therapy, [id number]- Gait training, [phone]- Electrical stimulation (manual), Patient/Family education, Balance training, Stair training, Taping, Dry Needling, Joint mobilization, DME instructions, Cryotherapy, and Moist heat  PLAN FOR NEXT SESSION: HEP, manual  for increasing knee flex / ext.did pt bring her SPC from home?   Mkayla Steele PT, DPT, LAT, ATC  05/30/23  3:02 PM

## 2023-05-31 NOTE — Progress Notes (Deleted)
 [facility] OFFICE PROGRESS NOTE  Janifer Meigs, FNP [address] [place] [address]  DIAGNOSIS: Left lower extremity deep venous thrombosis of unprovoked etiology except for her sedentary life secondary to old age and decreased immobility. The patient has no previous personal or family history of deep venous thrombosis   PRIOR THERAPY: None  CURRENT THERAPY:  Eliquis  5 mg p.o. twice daily.  She completed 6 months of treatment and currently on Eliquis  2.5 mg p.o. twice daily after left knee replacement.   INTERVAL HISTORY: Tina Bryant 78 y.o. female returns to the clinic today for follow-up visit.  The patient establish care with Dr. Marguerita Shih in July 2024.  She was last seen in the clinic in February 2025.  She has a history of a DVT in the lower extremity.  She is on Eliquis .  She tolerates this well with any abnormal bleeding or bruising.  She denies any signs and symptoms of blood clot at this time including leg swelling, pain, or erythema.  She denies any chest pain, shortness of breath, lightheadedness, or hemoptysis.  Dr. Marguerita Shih recommended arranging for blood work to determine if she needs to continue on anticoagulation.  Dr. Marguerita Shih recommended ordering a hypercoagulable panel after she completes her Eliquis  postsurgery from January.  She is here to review her results.   MEDICAL HISTORY: Past Medical History:  Diagnosis Date   Arthritis    Chronic kidney disease    CKD3   DVT (deep venous thrombosis) (HCC)    Hepatitis    b   Hypertension    Pneumonia    Pre-diabetes     ALLERGIES:  is allergic to tramadol and lisinopril.  MEDICATIONS:  Current Outpatient Medications  Medication Sig Dispense Refill   acetaminophen  (TYLENOL ) 500 MG tablet Take 500 mg by mouth every 6 (six) hours as needed for mild pain (pain score 1-3) or moderate pain (pain score 4-6) (Take for back pain).     apixaban  (ELIQUIS ) 2.5 MG TABS tablet Take 1 tablet (2.5 mg total)  by mouth 2 (two) times daily. 42 tablet 0   atorvastatin  (LIPITOR) 40 MG tablet Take 40 mg by mouth daily.     carboxymethylcellulose (REFRESH PLUS) 0.5 % SOLN Place 1-2 drops into both eyes See admin instructions. Instill 2 drops in left eye daily and 1 drop into the right eye daily     Cholecalciferol (VITAMIN D-1000 MAX ST) 25 MCG (1000 UT) tablet Take 1,000 Units by mouth daily.     docusate sodium  (COLACE) 100 MG capsule Take 1 capsule (100 mg total) by mouth 2 (two) times daily. 30 capsule 0   losartan  (COZAAR ) 25 MG tablet Take 25 mg by mouth daily with breakfast.     Multiple Vitamins-Minerals (CENTRUM SILVER PO) Take 1 tablet by mouth daily.     oxyCODONE -acetaminophen  (PERCOCET/ROXICET) 5-325 MG tablet Take 1 tablet by mouth every 4 (four) hours as needed for severe pain (pain score 7-10).     spironolactone -hydrochlorothiazide  (ALDACTAZIDE) 25-25 MG tablet Take 1 tablet by mouth daily.     tiZANidine  (ZANAFLEX ) 2 MG tablet Take 1 tablet (2 mg total) by mouth every 8 (eight) hours as needed for muscle spasms. 40 tablet 0   No current facility-administered medications for this visit.    SURGICAL HISTORY:  Past Surgical History:  Procedure Laterality Date   EYE SURGERY Left    strabismus surgery   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   Procedure: TOTAL KNEE ARTHROPLASTY, OPEN REDUCTION INTERNAL  FIXATION OF MEDIAL FEMORAL CONDYLE;  Surgeon: Neil Balls, MD;  Location: [facility];  Service: Orthopedics;  Laterality: Left;    REVIEW OF SYSTEMS:   Review of Systems  Constitutional: Negative for appetite change, chills, fatigue, fever and unexpected weight change.  HENT:   Negative for mouth sores, nosebleeds, sore throat and trouble swallowing.   Eyes: Negative for eye problems and icterus.  Respiratory: Negative for cough, hemoptysis, shortness of breath and wheezing.   Cardiovascular: Negative for chest pain and leg swelling.  Gastrointestinal: Negative for abdominal pain,  constipation, diarrhea, nausea and vomiting.  Genitourinary: Negative for bladder incontinence, difficulty urinating, dysuria, frequency and hematuria.   Musculoskeletal: Negative for back pain, gait problem, neck pain and neck stiffness.  Skin: Negative for itching and rash.  Neurological: Negative for dizziness, extremity weakness, gait problem, headaches, light-headedness and seizures.  Hematological: Negative for adenopathy. Does not bruise/bleed easily.  Psychiatric/Behavioral: Negative for confusion, depression and sleep disturbance. The patient is not nervous/anxious.     PHYSICAL EXAMINATION:  There were no vitals taken for this visit.  ECOG PERFORMANCE STATUS: {CHL ONC ECOG [id number]  Physical Exam  Constitutional: Oriented to person, place, and time and well-developed, well-nourished, and in no distress. No distress.  HENT:  Head: Normocephalic and atraumatic.  Mouth/Throat: Oropharynx is clear and moist. No oropharyngeal exudate.  Eyes: Conjunctivae are normal. Right eye exhibits no discharge. Left eye exhibits no discharge. No scleral icterus.  Neck: Normal range of motion. Neck supple.  Cardiovascular: Normal rate, regular rhythm, normal heart sounds and intact distal pulses.   Pulmonary/Chest: Effort normal and breath sounds normal. No respiratory distress. No wheezes. No rales.  Abdominal: Soft. Bowel sounds are normal. Exhibits no distension and no mass. There is no tenderness.  Musculoskeletal: Normal range of motion. Exhibits no edema.  Lymphadenopathy:    No cervical adenopathy.  Neurological: Alert and oriented to person, place, and time. Exhibits normal muscle tone. Gait normal. Coordination normal.  Skin: Skin is warm and dry. No rash noted. Not diaphoretic. No erythema. No pallor.  Psychiatric: Mood, memory and judgment normal.  Vitals reviewed.  LABORATORY DATA: Lab Results  Component Value Date   WBC 6.4 01/31/2023   HGB 10.4 (L) 01/31/2023   HCT  34.8 (L) 01/31/2023   MCV 72.3 (L) 01/31/2023   PLT 175 01/31/2023      Chemistry      Component Value Date/Time   NA 137 01/31/2023 1322   K 5.3 (H) 01/31/2023 1322   CL 105 01/31/2023 1322   CO2 22 01/31/2023 1322   BUN 26 (H) 01/31/2023 1322   CREATININE 1.29 (H) 01/31/2023 1322   CREATININE 1.24 (H) 07/18/2022 1130      Component Value Date/Time   CALCIUM  9.3 01/31/2023 1322   ALKPHOS 55 01/31/2023 1322   AST 32 01/31/2023 1322   AST 19 07/18/2022 1130   ALT 22 01/31/2023 1322   ALT 17 07/18/2022 1130   BILITOT 0.8 01/31/2023 1322   BILITOT 0.5 07/18/2022 1130       RADIOGRAPHIC STUDIES:  No results found.   ASSESSMENT/PLAN:  This is a very pleasant 78 year old with history of DVT.   She had a DVT DVT in the left leg, post-knee replacement surgery. No symptoms of chest pain, dyspnea, nausea, or vomiting. Eliquis  therapy for 3-6 weeks post-surgery to prevent recurrence. Hypercoagulability status to be evaluated post-Eliquis  for long-term anticoagulation needs.   She has since completed eliquis  and had a repeat hypercog panel performed.  She was seen with Dr. Marguerita Shih. Dr. Marguerita Shih reviewed the results and discussed it with the patient today.   The patient ***  Dr. Marguerita Shih recommends ***  We will see her back for labs and follow up on ***  The patient was advised to call immediately if she has any concerning symptoms in the interval. The patient voices understanding of current disease status and treatment options and is in agreement with the current care plan. All questions were answered. The patient knows to call the clinic with any problems, questions or concerns. We can certainly see the patient much sooner if necessary        No orders of the defined types were placed in this encounter.    I spent {CHL ONC TIME VISIT - ZOXWR:[id number]} counseling the patient face to face. The total time spent in the appointment was {CHL ONC TIME VISIT -  BJYNW:[id number]}.  Albion Weatherholtz L Harkirat Orozco, PA-C 05/31/23

## 2023-06-04 ENCOUNTER — Ambulatory Visit

## 2023-06-04 DIAGNOSIS — M6281 Muscle weakness (generalized): Secondary | ICD-10-CM

## 2023-06-04 DIAGNOSIS — M25562 Pain in left knee: Secondary | ICD-10-CM

## 2023-06-04 DIAGNOSIS — R262 Difficulty in walking, not elsewhere classified: Secondary | ICD-10-CM

## 2023-06-04 NOTE — Therapy (Signed)
 OUTPATIENT PHYSICAL THERAPY TREATMENT   Patient Name: Tina Bryant MRN: [id number] DOB:Apr 21, 1945, 78 y.o., female Today's Date: 06/04/2023  END OF SESSION:  PT End of Session - 06/04/23 1404     Visit Number 19    Number of Visits 24    Date for PT Re-Evaluation 06/21/23    Authorization Type [place] [employer]    Authorization Time Period Approved 5 visits 05/21/23-07/19/23    Authorization - Visit Number 4    Authorization - Number of Visits 5    PT Start Time 1400    PT Stop Time 1440    PT Time Calculation (min) 40 min    Equipment Utilized During Treatment Gait belt    Activity Tolerance Patient limited by pain;Patient tolerated treatment well    Behavior During Therapy [facility] for tasks assessed/performed                        Past Medical History:  Diagnosis Date   Arthritis    Chronic kidney disease    CKD3   DVT (deep venous thrombosis) (HCC)    Hepatitis    b   Hypertension    Pneumonia    Pre-diabetes    Past Surgical History:  Procedure Laterality Date   EYE SURGERY Left    strabismus surgery   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   Procedure: TOTAL KNEE ARTHROPLASTY, OPEN REDUCTION INTERNAL FIXATION OF MEDIAL FEMORAL CONDYLE;  Surgeon: Neil Balls, MD;  Location: [facility];  Service: Orthopedics;  Laterality: Left;   Patient Active Problem List   Diagnosis Date Noted   Primary osteoarthritis of left knee 02/03/2023   DVT (deep venous thrombosis) (HCC) 11/01/2022   Fall at home, initial encounter 11/01/2022   CAP (community acquired pneumonia) 11/01/2022   Severe sepsis (HCC) 11/01/2022   AKI (acute kidney injury) (HCC) 11/01/2022   Acute metabolic encephalopathy 11/01/2022   CKD (chronic kidney disease) stage 3, GFR 30-59 ml/min (HCC) 11/01/2022   Elevated troponin 11/01/2022   Non-insulin  treated type 2 diabetes mellitus (HCC) 11/01/2022   High anion gap metabolic acidosis 11/01/2022   Acute deep vein thrombosis (DVT) of  femoral vein of left lower extremity (HCC) 06/08/2022   Hyperlipidemia 07/09/2006   Microcytic anemia 07/09/2006   DEPRESSION 07/09/2006   Essential hypertension 07/09/2006    PCP: Janifer Meigs, FNP   REFERRING PROVIDER: Neil Balls, MD   REFERRING DIAG: Left TKA  THERAPY DIAG:  Acute pain of left knee  Difficulty in walking, not elsewhere classified  Muscle weakness (generalized)  Rationale for Evaluation and Treatment: Rehabilitation  ONSET DATE: 02-04-23 surgery L TKA had trouble for many years before surgery  SUBJECTIVE:   SUBJECTIVE STATEMENT: Pt presents to PT with no current pain. Has been compliant with HEP.   EVAL: Had the surgery on February 04, 2023 of L TKA with Dr Murrell Arrant. I have been doing exercises that I received from my HHPT.  I have been trying to walk a lot and I walk every day. I do not drive. I can stand about 5 minutes right now. I can sit as long as I want. I want to go back to church.  PERTINENT HISTORY: Hx of DVT, hyperlipdemia, HTN, Hx of fall but not in last 6 months hepatitis PAIN:  Are you having pain?  Yes: NPRS scale: 0/10 in left knee and at worst 10/10 Pain location: Left knee  Pain description: sometimes sharp going down to foot Aggravating factors:  standing on leg for longer than 5 minutes, straightening the leg, swelling in left knee, I can't get in the tub without difficulty, mop floor and household chores, can't shop and carry groceries Relieving factors: not much right now Can't bend over to do grooming and cut my toe nails PRECAUTIONS: Other: post left TKA  RED FLAGS: None   WEIGHT BEARING RESTRICTIONS: Yes WBAT L  FALLS:  Has patient fallen in last 6 months? No  LIVING ENVIRONMENT: Lives with: lives alone Lives in: House/apartment Stairs: Yes: External: 2 steps; on left going up Has following equipment at home: Single point cane, Walker - 2 wheeled, shower chair, and Grab bars  OCCUPATION: retired    used to work  at group home  PLOF: Independent  PATIENT GOALS: I want to be able to walk with a cane and to go back to church.  NEXT MD VISIT: TBD  OBJECTIVE:  Note: Objective measures were completed at Evaluation unless otherwise noted.  DIAGNOSTIC FINDINGS: see imaging  PATIENT SURVEYS:  LEFS 18/80 22.5% 05/15/2023: 30/80 - 38% function  COGNITION: Overall cognitive status: Within functional limits for tasks assessed     SENSATION: WFL  EDEMA:  Circumferential: R knee 44.0cm and Left knee is 48.25 cm  MUSCLE LENGTH: Hamstrings: Right 54 deg; Left 50 deg Thomas test: + bil  POSTURE: rounded shoulders, forward head, flexed trunk , and weight shift right  PALPATION: Well healing TKA L but has a wound opending on medial side of scar  LOWER EXTREMITY ROM:  Active ROM Right eval Left eval Left 03-21-23 Left 03/28/23 Left 04/02/23 Left 04/12/23 Left 04/25/23 Left 05/15/23  Hip flexion          Hip extension          Hip abduction          Hip adduction          Hip internal rotation          Hip external rotation          Knee flexion 133/P 138 99 P 109 A105 108 AA 111 AA 114 AA 114 AROM 110 AROM  Knee extension -5 -11 ext -10 ext     0 AROM  Ankle dorsiflexion          Ankle plantarflexion          Ankle inversion          Ankle eversion           (Blank rows = not tested)  LOWER EXTREMITY MMT:  MMT Right eval Left eval  Hip flexion 4 4-  Hip extension 4 4-  Hip abduction 3+ 3-  Hip adduction    Hip internal rotation    Hip external rotation    Knee flexion 4+ 3-  Knee extension 4 3-  Ankle dorsiflexion    Ankle plantarflexion    Ankle inversion    Ankle eversion     (Blank rows = not tested)  LOWER EXTREMITY SPECIAL TESTS:  NT due to surgery  FUNCTIONAL TESTS:  5 times sit to stand: unable to stand without using hands 30 sec with UE support 3 x 05/15/2023: 33 seconds no UE - able to perform 5 today 2 minute walk test: 158 ft (462 ft Norm) using rolling walker:  03/28/23: 179 feet  04/18/23: 216 feet  GAIT: Distance walked: 154ft 2 MWT Assistive device utilized: Walker - 2 wheeled Level of assistance: Modified independence Comments: Pt with antalgic gait with 2  wheeled walker   TREATMENT: [facility] Adult PT Treatment:                                                DATE: 06/04/23 Nu-Step L5 min x 5 LE  Walking 2x196ft with SPC (8/10 RPE) LAQ 3x10 4# Seated hamstring curl 2x10 blue band Sit to stand from table 3x5 increasing height of surface due to fatigue Standing TKE with blue band 2x10 L  [facility] Adult PT Treatment:                                                DATE: 05/30/23 Nu-Step L5 minx 5 LE  Walking x 162ft with SPC Sit to stand from table 3 x 5 increasing height of surface due to fatigue LAQ 2 x 10 with 4# Seated marching 2 x 20 alternating L/R with 4# ( cues to avoid posterior trunk lean) SLR LLE 2 x 10  Sustained hip abduction with bridge 2 x 10 with RTB Supine clamshell with Blue theraband 2 x 15  [facility] Adult PT Treatment:                                                DATE: 05/28/23 Nu-step L5 x 4 min UE/LE for functional activity tolerance Supine SLR 3x5 2# L Bridge 2x10 Ambulated 167ft  with SPC in R hand CGA  Hooklying clamshell 2x20 blue band Hooklying march 2x20 blue band Seated march 2x20 4# STS 3x5 - with UE lower table Supine QS x 10 - 5" hold SAQ 3x15 L 4# TKE with ball x 10 L  [facility] Adult PT Treatment:                                                DATE: 05/23/23 Nu-step L5 x 4 min UE/LE for functional activity tolerance LAQ 3x10 4# L STS 3x5 - low table with UE Ambulated 136ft  with SPC in R hand CGA  Supine QS x 10 - 5" hold Supine SLR 3x10 L SAQ 3x15 L 4# TKE with ball x 10 L  PATIENT EDUCATION:  Education details: continue HEP Person educated: Patient Education method: Explanation, Demonstration, Tactile cues, Verbal cues, and Handouts Education comprehension: verbalized understanding, returned  demonstration, verbal cues required, tactile cues required, and needs further education  HOME EXERCISE PROGRAM: Access Code: [email or web address]  updated 03-21-23 URL: [email or web address] Date: 03/21/2023 Prepared by: Sharlet Dawson  Exercises - Sit to stand with sink support Movement snack  - 1 x daily - 7 x weekly - 3 sets - 10 reps - Supine Knee Extension Stretch on Towel Roll  - 1 x daily - 7 x weekly - 3 sets - 10 reps - 5 sec  hold - Supine Heel Slide with Strap  - 1 x daily - 7 x weekly - 3 sets - 10 reps - Supine Short Arc Quad  - 1 x daily - 7 x weekly - 3 sets -  10 reps - Seated Hamstring Stretch  - 1-2 x daily - 7 x weekly - 1 sets - 3 reps - 30 sec hold - Long Arc Quad  - 1 x daily - 7 x weekly - 3 sets - 10 reps - Seated Knee Flexion AAROM  - 1 x daily - 7 x weekly - 3 sets - 10 reps  ASSESSMENT:  CLINICAL IMPRESSION: Pt was able to complete all prescribed exercises with no adverse effect. Exercises focused on improving quad strength and gait activity tolerance. Continued to work on progressing distance with gait using SPC today with pt ambulating farthest distance yet. No changes to HEP. Continues to benefit from skilled PT services, will assess goals next session.    EVAL- Patient is a 78 y.o. female who was seen today for physical therapy evaluation and treatment for L TKA. Pt has hx of DVT on Eliquis  , sedentary life style and decreased mobility.  Pt lives alone and would like to improve mobility in order to safely live on her own.  Pt would like to be able to safely enter and exit tub and perform household chores safely in home with adequate sleep.  Pt tend to be more sedentary and understands she needs to improve mobility in order to prenvent DVTS.  Pt will benefit from skilled PT to address impairments and maximize functional mobility  OBJECTIVE IMPAIRMENTS: Abnormal gait, decreased activity tolerance, decreased knowledge of use of DME, decreased mobility,  difficulty walking, decreased ROM, decreased strength, increased edema, postural dysfunction, obesity, and pain.   ACTIVITY LIMITATIONS: carrying, lifting, bending, standing, squatting, sleeping, stairs, transfers, hygiene/grooming, and locomotion level  PARTICIPATION LIMITATIONS: meal prep, cleaning, laundry, shopping, and church  PERSONAL FACTORS: Hx of DVT, hyperlipdemia, HTN, Hx of fall but not in last 6 months are also affecting patient's functional outcome. hepatitis  REHAB POTENTIAL: Good  CLINICAL DECISION MAKING: Evolving/moderate complexity  EVALUATION COMPLEXITY: Moderate   GOALS: Goals reviewed with patient? Yes  SHORT TERM GOALS: Target date: 04-09-23  Pt will be independent with initial HEP Baseline:no knowledge Goal status: PARTIALLY  MET  2.  AROM of knee extension -10 to 100 flexion to increase mobility for transitional movements Baseline: eval left flexion 99 degrees 04/12/23: AAROM 114 05/15/2023: see ROM chart Goal status: MET  3.  Demonstrate and verbalize understanding of condition management including RICE, positioning, use of A.D., HEP Baseline: using 2 wheel walker at eval 04/12/23: using elevation Goal status: MET    LONG TERM GOALS: Target date: 06/21/2023  Pt will be independent with advanced HEP Baseline: no knowledge Goal status: IN PROGRESS  2.  Pt will be able demostrate 5 x STS in uncer 15 seconds to show improved LE strength. Baseline: Pt with heavy use of arms for 30 sec STS 3 x only 05/15/2023: 33 seconds no UE - able to perform 5 today Goal status: IN PROGRESS  3.  Pt will be able to use LRAD in order to walk and return to church Baseline: using 2 wheeled walker  04/18/23: SPC 62 feet SBA Goal status: IN PROGRESS  4.  Pt will be educated and demonstrate ability to rise from ground to standing to encourage safety in home living alone Baseline: unable to perform floor to stand x fer Goal status: INITIAL  5.  Pt will improve her L knee  flexion to  >/= 120 degrees and extension to </= 5 degrees with </= 2/10 pain for a more functional and efficient gait pattern Baseline: See AROM  chart Goal status: INITIAL  6.  Patient will report improved functional level on LEFS >/= 50/80 in order to allow for return to prior exercise level Baseline: eval 18/80 22.5% 05/15/2023: 30/80 - 38% function Goal status: IN PROGRESS  PLAN:  PT FREQUENCY: 2x/week  PT DURATION: 6 weeks  PLANNED INTERVENTIONS: [id number]- PT Re-evaluation, 97110-Therapeutic exercises, 97530- Therapeutic activity, 97112- Neuromuscular re-education, 97535- Self Care, [id number]- Manual therapy, [id number]- Gait training, [phone]- Electrical stimulation (manual), Patient/Family education, Balance training, Stair training, Taping, Dry Needling, Joint mobilization, DME instructions, Cryotherapy, and Moist heat  PLAN FOR NEXT SESSION: HEP, manual for increasing knee flex / ext.did pt bring her SPC from home?   Ivor Mars PT  06/04/23 3:32 PM

## 2023-06-05 ENCOUNTER — Inpatient Hospital Stay: Admitting: Physician Assistant

## 2023-06-06 ENCOUNTER — Ambulatory Visit

## 2023-06-06 ENCOUNTER — Encounter

## 2023-06-06 NOTE — Therapy (Incomplete)
 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   Procedure: TOTAL KNEE ARTHROPLASTY, OPEN REDUCTION INTERNAL FIXATION OF MEDIAL FEMORAL CONDYLE;  Surgeon: Neil Balls, MD;  Location: [facility];  Service: Orthopedics;  Laterality: Left;   Patient Active Problem List   Diagnosis Date Noted   Primary osteoarthritis of left knee 02/03/2023   DVT (deep venous thrombosis) (HCC) 11/01/2022   Fall at home, initial encounter 11/01/2022   CAP (community acquired pneumonia) 11/01/2022   Severe sepsis (HCC) 11/01/2022   AKI (acute kidney injury) (HCC) 11/01/2022   Acute metabolic encephalopathy 11/01/2022   CKD (chronic kidney disease) stage 3, GFR 30-59 ml/min (HCC) 11/01/2022   Elevated troponin 11/01/2022   Non-insulin  treated type 2 diabetes mellitus (HCC) 11/01/2022   High anion gap metabolic acidosis 11/01/2022   Acute deep vein thrombosis (DVT) of femoral vein of left lower extremity (HCC) 06/08/2022   Hyperlipidemia 07/09/2006   Microcytic anemia 07/09/2006   DEPRESSION 07/09/2006   Essential hypertension 07/09/2006    PCP: Janifer Meigs, FNP   REFERRING PROVIDER: Neil Balls, MD   REFERRING DIAG: Left TKA  THERAPY DIAG:  No diagnosis found.  Rationale for Evaluation and Treatment: Rehabilitation  ONSET DATE: 02-04-23 surgery L TKA had trouble for many years before surgery  SUBJECTIVE:   SUBJECTIVE STATEMENT: Pt presents to PT with no current pain. Has been compliant with HEP.   EVAL: Had the  surgery on February 04, 2023 of L TKA with Dr Murrell Arrant. I have been doing exercises that I received from my HHPT.  I have been trying to walk a lot and I walk every day. I do not drive. I can stand about 5 minutes right now. I can sit as long as I want. I want to go back to church.  PERTINENT HISTORY: Hx of DVT, hyperlipdemia, HTN, Hx of fall but not in last 6 months hepatitis PAIN:  Are you having pain?  Yes: NPRS scale: 0/10 in left knee and at worst 10/10 Pain location: Left knee  Pain description: sometimes sharp going down to foot Aggravating factors: standing on leg for longer than 5 minutes, straightening the leg, swelling in left knee, I can't get in the tub without difficulty, mop floor and household chores, can't shop and carry groceries Relieving factors: not much right now Can't bend over to do grooming and cut my toe nails PRECAUTIONS: Other: post left TKA  RED FLAGS: None   WEIGHT BEARING RESTRICTIONS: Yes WBAT L  FALLS:  Has patient fallen in last 6 months? No  LIVING ENVIRONMENT: Lives with: lives alone Lives in: House/apartment Stairs: Yes: External: 2 steps; on left going up Has following equipment at home: Single point cane, Walker - 2 wheeled, shower chair, and Grab bars  OCCUPATION: retired    used to work at group home  PLOF: Independent  PATIENT GOALS: I want to be able to walk with a cane  and to go back to church.  NEXT MD VISIT: TBD  OBJECTIVE:  Note: Objective measures were completed at Evaluation unless otherwise noted.  DIAGNOSTIC FINDINGS: see imaging  PATIENT SURVEYS:  LEFS 18/80 22.5% 05/15/2023: 30/80 - 38% function  COGNITION: Overall cognitive status: Within functional limits for tasks assessed     SENSATION: WFL  EDEMA:  Circumferential: R knee 44.0cm and Left knee is 48.25 cm  MUSCLE LENGTH: Hamstrings: Right 54 deg; Left 50 deg Thomas test: + bil  POSTURE: rounded shoulders, forward head, flexed trunk , and weight shift  right  PALPATION: Well healing TKA L but has a wound opending on medial side of scar  LOWER EXTREMITY ROM:  Active ROM Right eval Left eval Left 03-21-23 Left 03/28/23 Left 04/02/23 Left 04/12/23 Left 04/25/23 Left 05/15/23  Hip flexion          Hip extension          Hip abduction          Hip adduction          Hip internal rotation          Hip external rotation          Knee flexion 133/P 138 99 P 109 A105 108 AA 111 AA 114 AA 114 AROM 110 AROM  Knee extension -5 -11 ext -10 ext     0 AROM  Ankle dorsiflexion          Ankle plantarflexion          Ankle inversion          Ankle eversion           (Blank rows = not tested)  LOWER EXTREMITY MMT:  MMT Right eval Left eval  Hip flexion 4 4-  Hip extension 4 4-  Hip abduction 3+ 3-  Hip adduction    Hip internal rotation    Hip external rotation    Knee flexion 4+ 3-  Knee extension 4 3-  Ankle dorsiflexion    Ankle plantarflexion    Ankle inversion    Ankle eversion     (Blank rows = not tested)  LOWER EXTREMITY SPECIAL TESTS:  NT due to surgery  FUNCTIONAL TESTS:  5 times sit to stand: unable to stand without using hands 30 sec with UE support 3 x 05/15/2023: 33 seconds no UE - able to perform 5 today 2 minute walk test: 158 ft (462 ft Norm) using rolling walker: 03/28/23: 179 feet  04/18/23: 216 feet  GAIT: Distance walked: 125ft 2 MWT Assistive device utilized: Walker - 2 wheeled Level of assistance: Modified independence Comments: Pt with antalgic gait with 2 wheeled walker   TREATMENT: [facility] Adult PT Treatment:                                                DATE: 06/04/23 Nu-Step L5 min x 5 LE  Walking 2x177ft with SPC (8/10 RPE) LAQ 3x10 4# Seated hamstring curl 2x10 blue band Sit to stand from table 3x5 increasing height of surface due to fatigue Standing TKE with blue band 2x10 L  [facility] Adult PT Treatment:  DATE: 05/30/23 Nu-Step L5 minx 5 LE  Walking x  160ft with SPC Sit to stand from table 3 x 5 increasing height of surface due to fatigue LAQ 2 x 10 with 4# Seated marching 2 x 20 alternating L/R with 4# ( cues to avoid posterior trunk lean) SLR LLE 2 x 10  Sustained hip abduction with bridge 2 x 10 with RTB Supine clamshell with Blue theraband 2 x 15  [facility] Adult PT Treatment:                                                DATE: 05/28/23 Nu-step L5 x 4 min UE/LE for functional activity tolerance Supine SLR 3x5 2# L Bridge 2x10 Ambulated 136ft  with SPC in R hand CGA  Hooklying clamshell 2x20 blue band Hooklying march 2x20 blue band Seated march 2x20 4# STS 3x5 - with UE lower table Supine QS x 10 - 5" hold SAQ 3x15 L 4# TKE with ball x 10 L  [facility] Adult PT Treatment:                                                DATE: 05/23/23 Nu-step L5 x 4 min UE/LE for functional activity tolerance LAQ 3x10 4# L STS 3x5 - low table with UE Ambulated 120ft  with SPC in R hand CGA  Supine QS x 10 - 5" hold Supine SLR 3x10 L SAQ 3x15 L 4# TKE with ball x 10 L  PATIENT EDUCATION:  Education details: continue HEP Person educated: Patient Education method: Explanation, Demonstration, Tactile cues, Verbal cues, and Handouts Education comprehension: verbalized understanding, returned demonstration, verbal cues required, tactile cues required, and needs further education  HOME EXERCISE PROGRAM: Access Code: [email or web address]  updated 03-21-23 URL: [email or web address] Date: 03/21/2023 Prepared by: Sharlet Dawson  Exercises - Sit to stand with sink support Movement snack  - 1 x daily - 7 x weekly - 3 sets - 10 reps - Supine Knee Extension Stretch on Towel Roll  - 1 x daily - 7 x weekly - 3 sets - 10 reps - 5 sec  hold - Supine Heel Slide with Strap  - 1 x daily - 7 x weekly - 3 sets - 10 reps - Supine Short Arc Quad  - 1 x daily - 7 x weekly - 3 sets - 10 reps - Seated Hamstring Stretch  - 1-2 x daily - 7 x weekly - 1 sets - 3 reps -  30 sec hold - Long Arc Quad  - 1 x daily - 7 x weekly - 3 sets - 10 reps - Seated Knee Flexion AAROM  - 1 x daily - 7 x weekly - 3 sets - 10 reps  ASSESSMENT:  CLINICAL IMPRESSION: Pt was able to complete all prescribed exercises with no adverse effect. Exercises focused on improving quad strength and gait activity tolerance. Continued to work on progressing distance with gait using SPC today with pt ambulating farthest distance yet. No changes to HEP. Continues to benefit from skilled PT services, will assess goals next session.    EVAL- Patient is a 78 y.o. female who was seen today for physical therapy evaluation and treatment for L TKA. Pt has hx of  DVT on Eliquis  , sedentary life style and decreased mobility.  Pt lives alone and would like to improve mobility in order to safely live on her own.  Pt would like to be able to safely enter and exit tub and perform household chores safely in home with adequate sleep.  Pt tend to be more sedentary and understands she needs to improve mobility in order to prenvent DVTS.  Pt will benefit from skilled PT to address impairments and maximize functional mobility  OBJECTIVE IMPAIRMENTS: Abnormal gait, decreased activity tolerance, decreased knowledge of use of DME, decreased mobility, difficulty walking, decreased ROM, decreased strength, increased edema, postural dysfunction, obesity, and pain.   ACTIVITY LIMITATIONS: carrying, lifting, bending, standing, squatting, sleeping, stairs, transfers, hygiene/grooming, and locomotion level  PARTICIPATION LIMITATIONS: meal prep, cleaning, laundry, shopping, and church  PERSONAL FACTORS: Hx of DVT, hyperlipdemia, HTN, Hx of fall but not in last 6 months are also affecting patient's functional outcome. hepatitis  REHAB POTENTIAL: Good  CLINICAL DECISION MAKING: Evolving/moderate complexity  EVALUATION COMPLEXITY: Moderate   GOALS: Goals reviewed with patient? Yes  SHORT TERM GOALS: Target date:  04-09-23  Pt will be independent with initial HEP Baseline:no knowledge Goal status: PARTIALLY  MET  2.  AROM of knee extension -10 to 100 flexion to increase mobility for transitional movements Baseline: eval left flexion 99 degrees 04/12/23: AAROM 114 05/15/2023: see ROM chart Goal status: MET  3.  Demonstrate and verbalize understanding of condition management including RICE, positioning, use of A.D., HEP Baseline: using 2 wheel walker at eval 04/12/23: using elevation Goal status: MET    LONG TERM GOALS: Target date: 06/21/2023  Pt will be independent with advanced HEP Baseline: no knowledge Goal status: IN PROGRESS  2.  Pt will be able demostrate 5 x STS in uncer 15 seconds to show improved LE strength. Baseline: Pt with heavy use of arms for 30 sec STS 3 x only 05/15/2023: 33 seconds no UE - able to perform 5 today Goal status: IN PROGRESS  3.  Pt will be able to use LRAD in order to walk and return to church Baseline: using 2 wheeled walker  04/18/23: SPC 62 feet SBA Goal status: IN PROGRESS  4.  Pt will be educated and demonstrate ability to rise from ground to standing to encourage safety in home living alone Baseline: unable to perform floor to stand x fer Goal status: INITIAL  5.  Pt will improve her L knee flexion to  >/= 120 degrees and extension to </= 5 degrees with </= 2/10 pain for a more functional and efficient gait pattern Baseline: See AROM chart Goal status: INITIAL  6.  Patient will report improved functional level on LEFS >/= 50/80 in order to allow for return to prior exercise level Baseline: eval 18/80 22.5% 05/15/2023: 30/80 - 38% function Goal status: IN PROGRESS  PLAN:  PT FREQUENCY: 2x/week  PT DURATION: 6 weeks  PLANNED INTERVENTIONS: 97164- PT Re-evaluation, 97110-Therapeutic exercises, 97530- Therapeutic activity, 97112- Neuromuscular re-education, 97535- Self Care, 16109- Manual therapy, [id number]- Gait training, [phone]- Electrical stimulation  (manual), Patient/Family education, Balance training, Stair training, Taping, Dry Needling, Joint mobilization, DME instructions, Cryotherapy, and Moist heat  PLAN FOR NEXT SESSION: HEP, manual for increasing knee flex / ext.did pt bring her SPC from home?   Ivor Mars PT  06/06/23 8:06 AM

## 2023-06-07 ENCOUNTER — Telehealth: Payer: Self-pay | Admitting: Internal Medicine

## 2023-06-10 ENCOUNTER — Encounter (HOSPITAL_BASED_OUTPATIENT_CLINIC_OR_DEPARTMENT_OTHER): Attending: Internal Medicine | Admitting: Internal Medicine

## 2023-06-10 DIAGNOSIS — Z872 Personal history of diseases of the skin and subcutaneous tissue: Secondary | ICD-10-CM | POA: Insufficient documentation

## 2023-06-10 DIAGNOSIS — Z96652 Presence of left artificial knee joint: Secondary | ICD-10-CM | POA: Insufficient documentation

## 2023-06-10 DIAGNOSIS — Z86718 Personal history of other venous thrombosis and embolism: Secondary | ICD-10-CM | POA: Diagnosis not present

## 2023-06-10 DIAGNOSIS — Z09 Encounter for follow-up examination after completed treatment for conditions other than malignant neoplasm: Secondary | ICD-10-CM | POA: Insufficient documentation

## 2023-06-12 ENCOUNTER — Telehealth: Payer: Self-pay

## 2023-06-12 ENCOUNTER — Inpatient Hospital Stay: Attending: Internal Medicine | Admitting: Internal Medicine

## 2023-06-12 DIAGNOSIS — I82401 Acute embolism and thrombosis of unspecified deep veins of right lower extremity: Secondary | ICD-10-CM

## 2023-06-12 NOTE — Telephone Encounter (Signed)
 Dr. Marguerita Shih tried to reach patient for telephone visit today, along with myself. LVM for return call.

## 2023-06-12 NOTE — Progress Notes (Signed)
 No show

## 2023-06-20 ENCOUNTER — Encounter: Payer: Self-pay | Admitting: Physical Therapy

## 2023-06-20 ENCOUNTER — Ambulatory Visit: Attending: Orthopedic Surgery | Admitting: Physical Therapy

## 2023-06-20 DIAGNOSIS — M25562 Pain in left knee: Secondary | ICD-10-CM | POA: Insufficient documentation

## 2023-06-20 DIAGNOSIS — M6281 Muscle weakness (generalized): Secondary | ICD-10-CM | POA: Insufficient documentation

## 2023-06-20 DIAGNOSIS — R262 Difficulty in walking, not elsewhere classified: Secondary | ICD-10-CM | POA: Insufficient documentation

## 2023-06-20 NOTE — Therapy (Signed)
 OUTPATIENT PHYSICAL THERAPY TREATMENT PHYSICAL THERAPY DISCHARGE SUMMARY  Visits from Start of Care: 20  Current functional level related to goals / functional outcomes: LEFS 45/80   Remaining deficits: See assessment   Education / Equipment: HEP, theraband, posture/ lifting mechanics.    Patient agrees to discharge. Patient goals were partially met. Patient is being discharged due to being pleased with the current functional level.    Patient Name: Tina Bryant MRN: [id number] DOB:January 06, 1946, 78 y.o., female Today's Date: 06/20/2023  END OF SESSION:  PT End of Session - 06/20/23 1339     Visit Number 20    Number of Visits 24    Date for PT Re-Evaluation 06/21/23    Authorization Type [employer]    Authorization Time Period Approved 5 visits 05/21/23-07/19/23    Authorization - Visit Number 5    PT Start Time 1335    PT Stop Time 1418    PT Time Calculation (min) 43 min    Activity Tolerance Patient tolerated treatment well    Behavior During Therapy WFL for tasks assessed/performed                      Past Medical History:  Diagnosis Date   Arthritis    Chronic kidney disease    CKD3   DVT (deep venous thrombosis) (HCC)    Hepatitis    b   Hypertension    Pneumonia    Pre-diabetes    Past Surgical History:  Procedure Laterality Date   EYE SURGERY Left    strabismus surgery   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   Procedure: TOTAL KNEE ARTHROPLASTY, OPEN REDUCTION INTERNAL FIXATION OF MEDIAL FEMORAL CONDYLE;  Surgeon: Neil Balls, MD;  Location: [facility];  Service: Orthopedics;  Laterality: Left;   Patient Active Problem List   Diagnosis Date Noted   Primary osteoarthritis of left knee 02/03/2023   DVT (deep venous thrombosis) (HCC) 11/01/2022   Fall at home, initial encounter 11/01/2022   CAP (community acquired pneumonia) 11/01/2022   Severe sepsis (HCC) 11/01/2022   AKI (acute kidney injury) (HCC) 11/01/2022   Acute  metabolic encephalopathy 11/01/2022   CKD (chronic kidney disease) stage 3, GFR 30-59 ml/min (HCC) 11/01/2022   Elevated troponin 11/01/2022   Non-insulin  treated type 2 diabetes mellitus (HCC) 11/01/2022   High anion gap metabolic acidosis 11/01/2022   Acute deep vein thrombosis (DVT) of femoral vein of left lower extremity (HCC) 06/08/2022   Hyperlipidemia 07/09/2006   Microcytic anemia 07/09/2006   DEPRESSION 07/09/2006   Essential hypertension 07/09/2006    PCP: Janifer Meigs, FNP   REFERRING PROVIDER: Neil Balls, MD   REFERRING DIAG: Left TKA  THERAPY DIAG:  Acute pain of left knee  Difficulty in walking, not elsewhere classified  Muscle weakness (generalized)  Rationale for Evaluation and Treatment: Rehabilitation  ONSET DATE: 02-04-23 surgery L TKA had trouble for many years before surgery  SUBJECTIVE:   SUBJECTIVE STATEMENT: 06/20/2023 I am doing pretty good, I don't have pain but every so often I get a quick sharp pain but it goes away pretty quickly.  EVAL: Had the surgery on February 04, 2023 of L TKA with Dr Murrell Arrant. I have been doing exercises that I received from my HHPT.  I have been trying to walk a lot and I walk every day. I do not drive. I can stand about 5 minutes right now. I can sit as long as I want. I want to go  back to church.  PERTINENT HISTORY: Hx of DVT, hyperlipdemia, HTN, Hx of fall but not in last 6 months hepatitis PAIN:  Are you having pain?  Yes: NPRS scale: 0/10 in L Pain location: Left knee  Pain description: sometimes sharp going down to foot Aggravating factors: standing on leg for longer than 5 minutes, straightening the leg, swelling in left knee, I can't get in the tub without difficulty, mop floor and household chores, can't shop and carry groceries Relieving factors: not much right now Can't bend over to do grooming and cut my toe nails PRECAUTIONS: Other: post left TKA  RED FLAGS: None   WEIGHT BEARING  RESTRICTIONS: Yes WBAT L  FALLS:  Has patient fallen in last 6 months? No  LIVING ENVIRONMENT: Lives with: lives alone Lives in: House/apartment Stairs: Yes: External: 2 steps; on left going up Has following equipment at home: Single point cane, Walker - 2 wheeled, shower chair, and Grab bars  OCCUPATION: retired    used to work at group home  PLOF: Independent  PATIENT GOALS: I want to be able to walk with a cane and to go back to church.  NEXT MD VISIT: TBD  OBJECTIVE:  Note: Objective measures were completed at Evaluation unless otherwise noted.  DIAGNOSTIC FINDINGS: see imaging  PATIENT SURVEYS:  LEFS 18/80 22.5% 05/15/2023: 30/80 - 38% function 06/20/23: 45/80  COGNITION: Overall cognitive status: Within functional limits for tasks assessed     SENSATION: WFL  EDEMA:  Circumferential: R knee 44.0cm and Left knee is 48.25 cm  MUSCLE LENGTH: Hamstrings: Right 54 deg; Left 50 deg Thomas test: + bil  POSTURE: rounded shoulders, forward head, flexed trunk , and weight shift right  PALPATION: Well healing TKA L but has a wound opending on medial side of scar  LOWER EXTREMITY ROM:  Active ROM Right eval Left eval Left 03-21-23 Left 03/28/23 Left 04/02/23 Left 04/12/23 Left 04/25/23 Left 05/15/23 Left  06/20/2023  Hip flexion           Hip extension           Hip abduction           Hip adduction           Hip internal rotation           Hip external rotation           Knee flexion 133/P 138 99 P 109 A105 108 AA 111 AA 114 AA 114 AROM 110 AROM 118 A  Knee extension -5 -11 ext -10 ext     0 AROM 0 A  Ankle dorsiflexion           Ankle plantarflexion           Ankle inversion           Ankle eversion            (Blank rows = not tested)  LOWER EXTREMITY MMT:  MMT Right eval Left eval  Hip flexion 4 4-  Hip extension 4 4-  Hip abduction 3+ 3-  Hip adduction    Hip internal rotation    Hip external rotation    Knee flexion 4+ 3-  Knee extension 4 3-   Ankle dorsiflexion    Ankle plantarflexion    Ankle inversion    Ankle eversion     (Blank rows = not tested)  LOWER EXTREMITY SPECIAL TESTS:  NT due to surgery  FUNCTIONAL TESTS:  5 times sit to stand: unable  to stand without using hands 30 sec with UE support 3 x 05/15/2023: 33 seconds no UE - able to perform 5 today 06/20/2023: 28 sec 2 minute walk test: 158 ft (462 ft Norm) using rolling walker: 03/28/23: 179 feet  04/18/23: 216 feet  GAIT: Distance walked: 137ft 2 MWT Assistive device utilized: Walker - 2 wheeled Level of assistance: Modified independence Comments: Pt with antalgic gait with 2 wheeled walker   TREATMENT: [facility] Adult PT Treatment:                                                DATE: 06/20/23 Standing hip abduction 2 x 12 Standing hip extension 2 x 12 Standing mini squat with RW 1 x 10 Reviewed ROM, goals  Reviewed and updated HEP  [facility] Adult PT Treatment:                                                DATE: 06/04/23 Nu-Step L5 min x 5 LE  Walking 2x161ft with SPC (8/10 RPE) LAQ 3x10 4# Seated hamstring curl 2x10 blue band Sit to stand from table 3x5 increasing height of surface due to fatigue Standing TKE with blue band 2x10 L  [facility] Adult PT Treatment:                                                DATE: 05/30/23 Nu-Step L5 minx 5 LE  Walking x 157ft with SPC Sit to stand from table 3 x 5 increasing height of surface due to fatigue LAQ 2 x 10 with 4# Seated marching 2 x 20 alternating L/R with 4# ( cues to avoid posterior trunk lean) SLR LLE 2 x 10  Sustained hip abduction with bridge 2 x 10 with RTB Supine clamshell with Blue theraband 2 x 15  [facility] Adult PT Treatment:                                                DATE: 05/28/23 Nu-step L5 x 4 min UE/LE for functional activity tolerance Supine SLR 3x5 2# L Bridge 2x10 Ambulated 147ft  with SPC in R hand CGA  Hooklying clamshell 2x20 blue band Hooklying march 2x20 blue band Seated march 2x20  4# STS 3x5 - with UE lower table Supine QS x 10 - 5 hold SAQ 3x15 L 4# TKE with ball x 10 L   PATIENT EDUCATION:  Education details: continue HEP Person educated: Patient Education method: Explanation, Demonstration, Tactile cues, Verbal cues, and Handouts Education comprehension: verbalized understanding, returned demonstration, verbal cues required, tactile cues required, and needs further education  HOME EXERCISE PROGRAM: Access Code: [email or web address] URL: [email or web address] Date: 06/20/2023 Prepared by: Laron Plummer  Exercises - Sit to stand with sink support Movement snack  - 1 x daily - 7 x weekly - 3 sets - 10 reps - Supine Knee Extension Stretch on Towel Roll  - 1 x daily - 7 x weekly - 3 sets - 10  reps - 5 sec  hold - Supine Heel Slide with Strap  - 1 x daily - 7 x weekly - 3 sets - 10 reps - Supine Short Arc Quad  - 1 x daily - 7 x weekly - 3 sets - 10 reps - Seated Hamstring Stretch  - 1-2 x daily - 7 x weekly - 1 sets - 3 reps - 30 sec hold - Long Arc Quad  - 1 x daily - 7 x weekly - 3 sets - 10 reps - Seated Knee Flexion AAROM  - 1 x daily - 7 x weekly - 3 sets - 10 reps - Seated Hip Abduction with Resistance  - 1 x daily - 7 x weekly - 2 sets - 10 reps - Standing Hip Abduction with Counter Support  - 1 x daily - 7 x weekly - 2 sets - 10 reps - Standing Hip Extension with Counter Support  - 1 x daily - 7 x weekly - 2 sets - 10 reps - Mini Squats with Walker and Chair  - 1 x daily - 7 x weekly - 2 sets - 10 reps  ASSESSMENT:  CLINICAL IMPRESSION: 06/20/2023 Mrs hodzic has made excellent progress with physical therapy improving her L knee ROM total arc to 0-118 degrees and additionally reports no pain in the knee. She does not some intermittent pain in the L knee that occurs in sitting but is fleeting with no precuror or cause. She has met or partially met all goals with the exception of LTG #2. She reported feeling like she is back at baseline and is able  to do her exercises independently. Mrs Kuna is able to maintain and progress her current LOF IND and will be formally discharged today.    EVAL- Patient is a 78 y.o. female who was seen today for physical therapy evaluation and treatment for L TKA. Pt has hx of DVT on Eliquis  , sedentary life style and decreased mobility.  Pt lives alone and would like to improve mobility in order to safely live on her own.  Pt would like to be able to safely enter and exit tub and perform household chores safely in home with adequate sleep.  Pt tend to be more sedentary and understands she needs to improve mobility in order to prenvent DVTS.  Pt will benefit from skilled PT to address impairments and maximize functional mobility  OBJECTIVE IMPAIRMENTS: Abnormal gait, decreased activity tolerance, decreased knowledge of use of DME, decreased mobility, difficulty walking, decreased ROM, decreased strength, increased edema, postural dysfunction, obesity, and pain.   ACTIVITY LIMITATIONS: carrying, lifting, bending, standing, squatting, sleeping, stairs, transfers, hygiene/grooming, and locomotion level  PARTICIPATION LIMITATIONS: meal prep, cleaning, laundry, shopping, and church  PERSONAL FACTORS: Hx of DVT, hyperlipdemia, HTN, Hx of fall but not in last 6 months are also affecting patient's functional outcome. hepatitis  REHAB POTENTIAL: Good  CLINICAL DECISION MAKING: Evolving/moderate complexity  EVALUATION COMPLEXITY: Moderate   GOALS: Goals reviewed with patient? Yes  SHORT TERM GOALS: Target date: 04-09-23  Pt will be independent with initial HEP Baseline:no knowledge Goal status: PARTIALLY  MET  2.  AROM of knee extension -10 to 100 flexion to increase mobility for transitional movements Baseline: eval left flexion 99 degrees 04/12/23: AAROM 114 05/15/2023: see ROM chart Goal status: MET  3.  Demonstrate and verbalize understanding of condition management including RICE, positioning, use of  A.D., HEP Baseline: using 2 wheel walker at eval 04/12/23: using elevation Goal status: MET  LONG TERM GOALS: Target date: 06/21/2023  Pt will be independent with advanced HEP Baseline: no knowledge Goal status: MET 06/20/2023  2.  Pt will be able demostrate 5 x STS in uncer 15 seconds to show improved LE strength. Baseline: Pt with heavy use of arms for 30 sec STS 3 x only 05/15/2023: 33 seconds no UE - able to perform 5 today Goal status: not met 06/20/2023  3.  Pt will be able to use LRAD in order to walk and return to church Baseline: using 2 wheeled walker  04/18/23: SPC 62 feet SBA Goal status:MET 06/20/23  4.  Pt will be educated and demonstrate ability to rise from ground to standing to encourage safety in home living alone Baseline: unable to perform floor to stand x fer Goal status: Partially MET  5.  Pt will improve her L knee flexion to  >/= 120 degrees and extension to </= 5 degrees with </= 2/10 pain for a more functional and efficient gait pattern Baseline: See AROM chart Goal status: Paritally met   6.  Patient will report improved functional level on LEFS >/= 50/80 in order to allow for return to prior exercise level Baseline: eval 18/80 22.5% 05/15/2023: 30/80 - 38% function 06/20/2023: 45/80 Goal status: Partially met  PLAN:  PT FREQUENCY: 2x/week  PT DURATION: 6 weeks  PLANNED INTERVENTIONS: 97164- PT Re-evaluation, 97110-Therapeutic exercises, 97530- Therapeutic activity, 97112- Neuromuscular re-education, 97535- Self Care, [id number]- Manual therapy, 97116- Gait training, [phone]- Electrical stimulation (manual), Patient/Family education, Balance training, Stair training, Taping, Dry Needling, Joint mobilization, DME instructions, Cryotherapy, and Moist heat  PLAN FOR NEXT SESSION: HEP, manual for increasing knee flex / ext.did pt bring her SPC from home?   Neely Cecena PT, DPT, LAT, ATC  06/20/23  2:33 PM

## 2023-09-11 ENCOUNTER — Other Ambulatory Visit: Payer: Self-pay | Admitting: Internal Medicine
